# Patient Record
Sex: Female | Born: 1975 | Race: Black or African American | Hispanic: No | Marital: Single | Smoking: Never smoker
Health system: Southern US, Community
[De-identification: ages and names within clinical notes are randomized; demographics above are authoritative.]

## PROBLEM LIST (undated history)

## (undated) DIAGNOSIS — M069 Rheumatoid arthritis, unspecified: Secondary | ICD-10-CM

## (undated) DIAGNOSIS — K589 Irritable bowel syndrome without diarrhea: Secondary | ICD-10-CM

## (undated) DIAGNOSIS — J45909 Unspecified asthma, uncomplicated: Secondary | ICD-10-CM

## (undated) HISTORY — PX: HERNIA REPAIR: SHX51

## (undated) HISTORY — PX: ABDOMINAL HYSTERECTOMY: SHX81

## (undated) HISTORY — PX: APPENDECTOMY: SHX54

## (undated) HISTORY — PX: CHOLECYSTECTOMY: SHX55

## (undated) HISTORY — PX: TUBAL LIGATION: SHX77

## (undated) HISTORY — PX: CYSTECTOMY: SUR359

## (undated) HISTORY — PX: BREAST REDUCTION SURGERY: SHX8

---

## 2002-10-02 ENCOUNTER — Emergency Department (HOSPITAL_COMMUNITY): Admission: EM | Admit: 2002-10-02 | Discharge: 2002-10-03 | Payer: Self-pay | Admitting: Emergency Medicine

## 2002-10-10 ENCOUNTER — Emergency Department (HOSPITAL_COMMUNITY): Admission: EM | Admit: 2002-10-10 | Discharge: 2002-10-10 | Payer: Self-pay | Admitting: Emergency Medicine

## 2002-10-11 ENCOUNTER — Encounter: Payer: Self-pay | Admitting: Emergency Medicine

## 2002-10-28 ENCOUNTER — Encounter: Payer: Self-pay | Admitting: Emergency Medicine

## 2002-10-28 ENCOUNTER — Emergency Department (HOSPITAL_COMMUNITY): Admission: EM | Admit: 2002-10-28 | Discharge: 2002-10-29 | Payer: Self-pay | Admitting: Emergency Medicine

## 2002-11-26 ENCOUNTER — Encounter: Payer: Self-pay | Admitting: Emergency Medicine

## 2002-11-26 ENCOUNTER — Emergency Department (HOSPITAL_COMMUNITY): Admission: EM | Admit: 2002-11-26 | Discharge: 2002-11-26 | Payer: Self-pay | Admitting: Emergency Medicine

## 2002-11-30 ENCOUNTER — Emergency Department (HOSPITAL_COMMUNITY): Admission: EM | Admit: 2002-11-30 | Discharge: 2002-12-01 | Payer: Self-pay | Admitting: Emergency Medicine

## 2002-12-02 ENCOUNTER — Emergency Department (HOSPITAL_COMMUNITY): Admission: EM | Admit: 2002-12-02 | Discharge: 2002-12-02 | Payer: Self-pay | Admitting: Emergency Medicine

## 2003-01-07 ENCOUNTER — Emergency Department (HOSPITAL_COMMUNITY): Admission: EM | Admit: 2003-01-07 | Discharge: 2003-01-07 | Payer: Self-pay | Admitting: Emergency Medicine

## 2003-03-25 ENCOUNTER — Emergency Department (HOSPITAL_COMMUNITY): Admission: EM | Admit: 2003-03-25 | Discharge: 2003-03-26 | Payer: Self-pay | Admitting: Emergency Medicine

## 2003-03-26 ENCOUNTER — Encounter: Payer: Self-pay | Admitting: Emergency Medicine

## 2003-03-31 ENCOUNTER — Emergency Department (HOSPITAL_COMMUNITY): Admission: EM | Admit: 2003-03-31 | Discharge: 2003-03-31 | Payer: Self-pay | Admitting: Emergency Medicine

## 2003-04-02 ENCOUNTER — Inpatient Hospital Stay (HOSPITAL_COMMUNITY): Admission: AD | Admit: 2003-04-02 | Discharge: 2003-04-02 | Payer: Self-pay | Admitting: Family Medicine

## 2003-04-25 ENCOUNTER — Emergency Department (HOSPITAL_COMMUNITY): Admission: EM | Admit: 2003-04-25 | Discharge: 2003-04-25 | Payer: Self-pay | Admitting: Emergency Medicine

## 2003-06-15 ENCOUNTER — Encounter: Payer: Self-pay | Admitting: Emergency Medicine

## 2003-06-16 ENCOUNTER — Inpatient Hospital Stay (HOSPITAL_COMMUNITY): Admission: EM | Admit: 2003-06-16 | Discharge: 2003-06-17 | Payer: Self-pay | Admitting: Emergency Medicine

## 2003-07-28 ENCOUNTER — Emergency Department (HOSPITAL_COMMUNITY): Admission: EM | Admit: 2003-07-28 | Discharge: 2003-07-28 | Payer: Self-pay | Admitting: *Deleted

## 2003-09-05 ENCOUNTER — Encounter: Payer: Self-pay | Admitting: Emergency Medicine

## 2003-09-05 ENCOUNTER — Emergency Department (HOSPITAL_COMMUNITY): Admission: EM | Admit: 2003-09-05 | Discharge: 2003-09-05 | Payer: Self-pay | Admitting: Emergency Medicine

## 2003-09-15 ENCOUNTER — Emergency Department (HOSPITAL_COMMUNITY): Admission: EM | Admit: 2003-09-15 | Discharge: 2003-09-15 | Payer: Self-pay | Admitting: Emergency Medicine

## 2003-09-15 ENCOUNTER — Encounter: Payer: Self-pay | Admitting: Emergency Medicine

## 2003-09-18 ENCOUNTER — Encounter: Payer: Self-pay | Admitting: Emergency Medicine

## 2003-09-18 ENCOUNTER — Emergency Department (HOSPITAL_COMMUNITY): Admission: EM | Admit: 2003-09-18 | Discharge: 2003-09-18 | Payer: Self-pay | Admitting: Emergency Medicine

## 2003-09-25 ENCOUNTER — Emergency Department (HOSPITAL_COMMUNITY): Admission: EM | Admit: 2003-09-25 | Discharge: 2003-09-26 | Payer: Self-pay | Admitting: Emergency Medicine

## 2003-09-25 ENCOUNTER — Encounter: Payer: Self-pay | Admitting: Emergency Medicine

## 2003-09-30 ENCOUNTER — Emergency Department (HOSPITAL_COMMUNITY): Admission: EM | Admit: 2003-09-30 | Discharge: 2003-09-30 | Payer: Self-pay | Admitting: Emergency Medicine

## 2003-09-30 ENCOUNTER — Encounter: Payer: Self-pay | Admitting: Emergency Medicine

## 2003-10-05 ENCOUNTER — Emergency Department (HOSPITAL_COMMUNITY): Admission: EM | Admit: 2003-10-05 | Discharge: 2003-10-06 | Payer: Self-pay | Admitting: Emergency Medicine

## 2003-10-07 ENCOUNTER — Emergency Department (HOSPITAL_COMMUNITY): Admission: EM | Admit: 2003-10-07 | Discharge: 2003-10-07 | Payer: Self-pay | Admitting: Emergency Medicine

## 2003-10-07 ENCOUNTER — Encounter: Payer: Self-pay | Admitting: Emergency Medicine

## 2003-10-17 ENCOUNTER — Emergency Department (HOSPITAL_COMMUNITY): Admission: EM | Admit: 2003-10-17 | Discharge: 2003-10-17 | Payer: Self-pay | Admitting: Emergency Medicine

## 2003-10-22 ENCOUNTER — Encounter (INDEPENDENT_AMBULATORY_CARE_PROVIDER_SITE_OTHER): Payer: Self-pay | Admitting: Specialist

## 2003-10-22 ENCOUNTER — Ambulatory Visit (HOSPITAL_COMMUNITY): Admission: RE | Admit: 2003-10-22 | Discharge: 2003-10-23 | Payer: Self-pay | Admitting: Surgery

## 2003-10-30 ENCOUNTER — Inpatient Hospital Stay (HOSPITAL_COMMUNITY): Admission: EM | Admit: 2003-10-30 | Discharge: 2003-11-04 | Payer: Self-pay | Admitting: Emergency Medicine

## 2003-12-02 ENCOUNTER — Emergency Department (HOSPITAL_COMMUNITY): Admission: EM | Admit: 2003-12-02 | Discharge: 2003-12-02 | Payer: Self-pay | Admitting: Emergency Medicine

## 2003-12-21 ENCOUNTER — Emergency Department (HOSPITAL_COMMUNITY): Admission: EM | Admit: 2003-12-21 | Discharge: 2003-12-21 | Payer: Self-pay | Admitting: Emergency Medicine

## 2003-12-22 ENCOUNTER — Emergency Department (HOSPITAL_COMMUNITY): Admission: EM | Admit: 2003-12-22 | Discharge: 2003-12-22 | Payer: Self-pay | Admitting: Emergency Medicine

## 2003-12-24 ENCOUNTER — Emergency Department (HOSPITAL_COMMUNITY): Admission: EM | Admit: 2003-12-24 | Discharge: 2003-12-24 | Payer: Self-pay | Admitting: Emergency Medicine

## 2003-12-31 ENCOUNTER — Emergency Department (HOSPITAL_COMMUNITY): Admission: EM | Admit: 2003-12-31 | Discharge: 2003-12-31 | Payer: Self-pay | Admitting: Emergency Medicine

## 2004-01-06 ENCOUNTER — Emergency Department (HOSPITAL_COMMUNITY): Admission: EM | Admit: 2004-01-06 | Discharge: 2004-01-06 | Payer: Self-pay | Admitting: Emergency Medicine

## 2004-01-10 ENCOUNTER — Encounter: Admission: RE | Admit: 2004-01-10 | Discharge: 2004-01-10 | Payer: Self-pay | Admitting: Surgery

## 2004-01-12 ENCOUNTER — Emergency Department (HOSPITAL_COMMUNITY): Admission: EM | Admit: 2004-01-12 | Discharge: 2004-01-12 | Payer: Self-pay | Admitting: Unknown Physician Specialty

## 2004-02-06 ENCOUNTER — Emergency Department (HOSPITAL_COMMUNITY): Admission: EM | Admit: 2004-02-06 | Discharge: 2004-02-06 | Payer: Self-pay | Admitting: Emergency Medicine

## 2004-02-17 ENCOUNTER — Emergency Department (HOSPITAL_COMMUNITY): Admission: EM | Admit: 2004-02-17 | Discharge: 2004-02-18 | Payer: Self-pay | Admitting: Emergency Medicine

## 2004-02-25 ENCOUNTER — Emergency Department (HOSPITAL_COMMUNITY): Admission: EM | Admit: 2004-02-25 | Discharge: 2004-02-25 | Payer: Self-pay | Admitting: Emergency Medicine

## 2004-02-25 ENCOUNTER — Encounter: Payer: Self-pay | Admitting: Family Medicine

## 2004-02-26 ENCOUNTER — Other Ambulatory Visit: Admission: RE | Admit: 2004-02-26 | Discharge: 2004-02-26 | Payer: Self-pay | Admitting: Obstetrics and Gynecology

## 2004-03-06 ENCOUNTER — Emergency Department (HOSPITAL_COMMUNITY): Admission: EM | Admit: 2004-03-06 | Discharge: 2004-03-06 | Payer: Self-pay | Admitting: *Deleted

## 2004-03-17 ENCOUNTER — Emergency Department (HOSPITAL_COMMUNITY): Admission: EM | Admit: 2004-03-17 | Discharge: 2004-03-17 | Payer: Self-pay | Admitting: Emergency Medicine

## 2004-05-08 ENCOUNTER — Ambulatory Visit (HOSPITAL_COMMUNITY): Admission: RE | Admit: 2004-05-08 | Discharge: 2004-05-08 | Payer: Self-pay | Admitting: Internal Medicine

## 2004-05-16 ENCOUNTER — Emergency Department (HOSPITAL_COMMUNITY): Admission: EM | Admit: 2004-05-16 | Discharge: 2004-05-16 | Payer: Self-pay | Admitting: Emergency Medicine

## 2004-05-18 ENCOUNTER — Emergency Department (HOSPITAL_COMMUNITY): Admission: EM | Admit: 2004-05-18 | Discharge: 2004-05-18 | Payer: Self-pay | Admitting: Emergency Medicine

## 2004-05-24 ENCOUNTER — Emergency Department (HOSPITAL_COMMUNITY): Admission: EM | Admit: 2004-05-24 | Discharge: 2004-05-24 | Payer: Self-pay | Admitting: Emergency Medicine

## 2004-06-01 ENCOUNTER — Emergency Department (HOSPITAL_COMMUNITY): Admission: EM | Admit: 2004-06-01 | Discharge: 2004-06-01 | Payer: Self-pay | Admitting: Emergency Medicine

## 2004-07-03 ENCOUNTER — Emergency Department (HOSPITAL_COMMUNITY): Admission: EM | Admit: 2004-07-03 | Discharge: 2004-07-04 | Payer: Self-pay | Admitting: Emergency Medicine

## 2004-07-05 ENCOUNTER — Emergency Department (HOSPITAL_COMMUNITY): Admission: EM | Admit: 2004-07-05 | Discharge: 2004-07-05 | Payer: Self-pay | Admitting: Emergency Medicine

## 2004-07-20 ENCOUNTER — Emergency Department (HOSPITAL_COMMUNITY): Admission: EM | Admit: 2004-07-20 | Discharge: 2004-07-20 | Payer: Self-pay | Admitting: Emergency Medicine

## 2004-10-17 ENCOUNTER — Emergency Department (HOSPITAL_COMMUNITY): Admission: EM | Admit: 2004-10-17 | Discharge: 2004-10-17 | Payer: Self-pay | Admitting: Emergency Medicine

## 2004-11-16 IMAGING — CT CT ABDOMEN W/ CM
1 of 3 series · 15 of 32 positions shown, 20 images · IV contrast (omnipaque)
Comparison: none

CLINICAL DATA: abdominal pain
 CT ABDOMEN AND PELVIS WITH CONTRAST
TECHNIQUE: Multidetector helical imaging carried out through the abdomen and pelvis utilizing oral and IV contrast (660cc Omnipaque 300).  Comparison:  10/29/03.
 CT ABDOMEN WITH CONTRAST
 Lung bases clear.  Liver, spleen, pancreas and adrenals normal.  Prior cholecystectomy.  No bile duct dilatation.  Early and delayed images of the kidneys normal.  No adenopathy or ascites.
 IMPRESSION
 Status post cholecystectomy- no acute or specific findings.  
 CT PELVIS
 Status post hysterectomy and appendectomy.  No focal masses, adenopathy or fluid collections.  No changes to suggest diverticulitis.
 No pathological findings in the pelvis.

[Series 3: — · axial · 0.77mm/px · z∈[+772,+1192]mm · 15 of 94 slices shown, 20 images]
[im 5/94  soft-tissue]
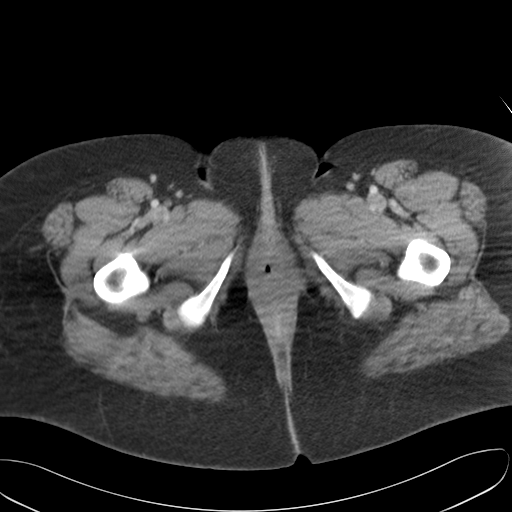
[im 5/94  bone]
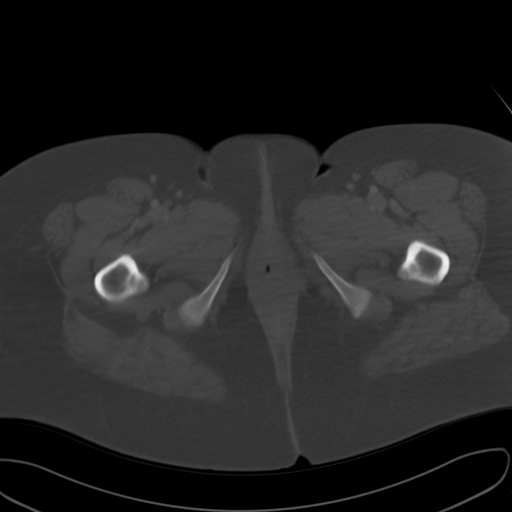
[im 14/94  soft-tissue]
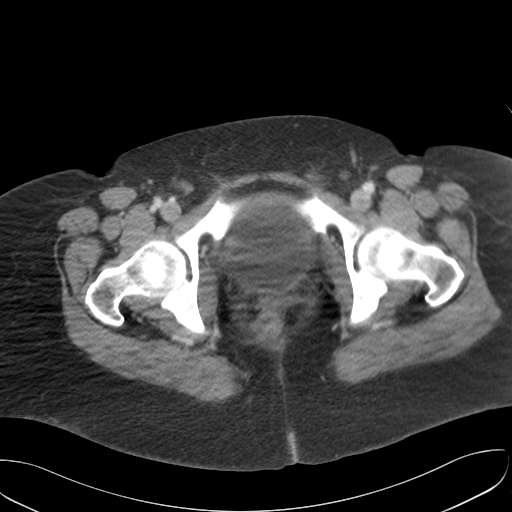
[im 19/94  soft-tissue]
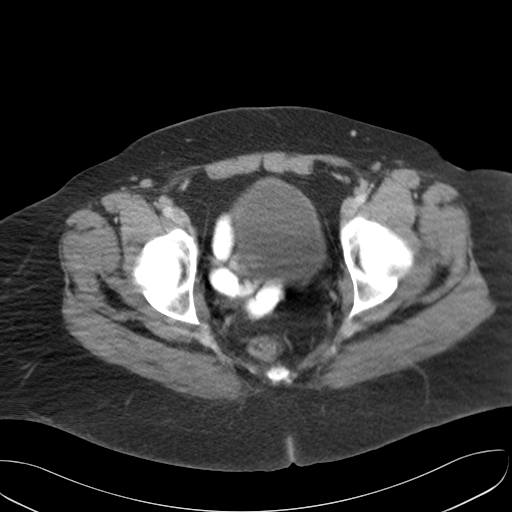
[im 24/94  soft-tissue]
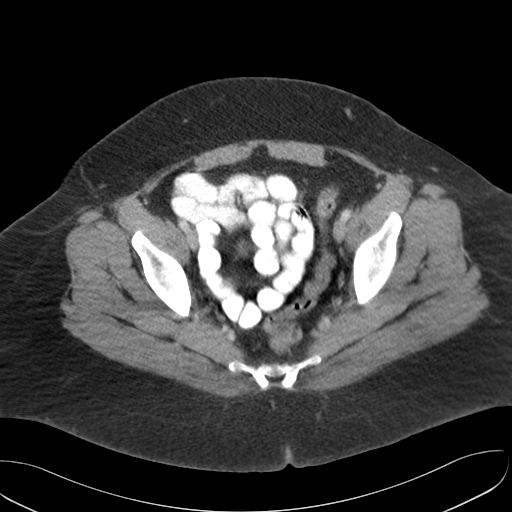
[im 33/94  soft-tissue]
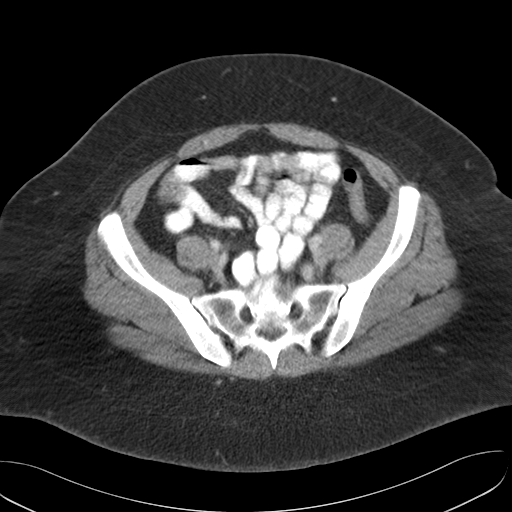
[im 38/94  soft-tissue]
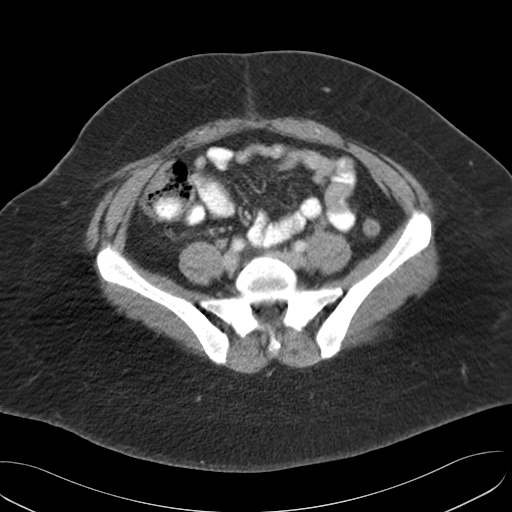
[im 42/94  soft-tissue]
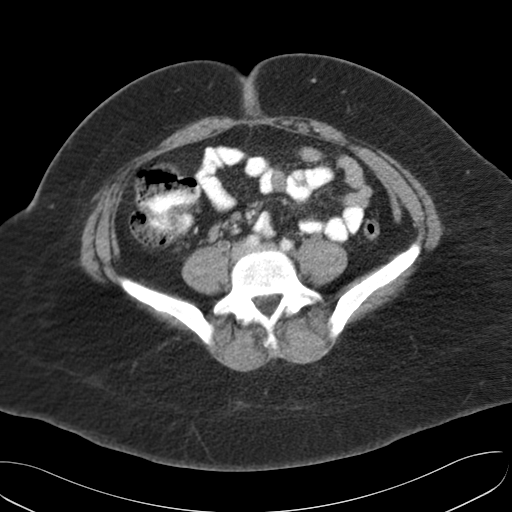
[im 52/94  soft-tissue]
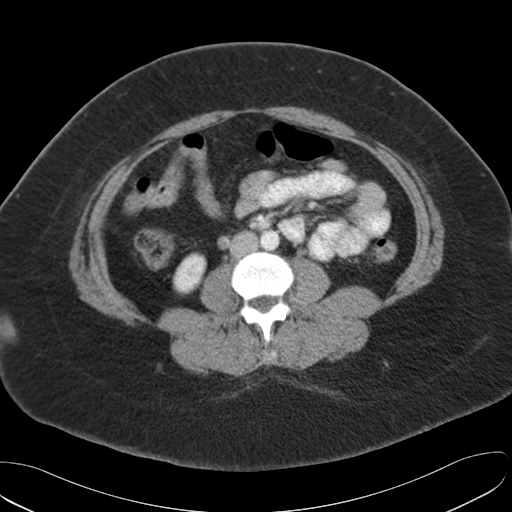
[im 56/94  soft-tissue]
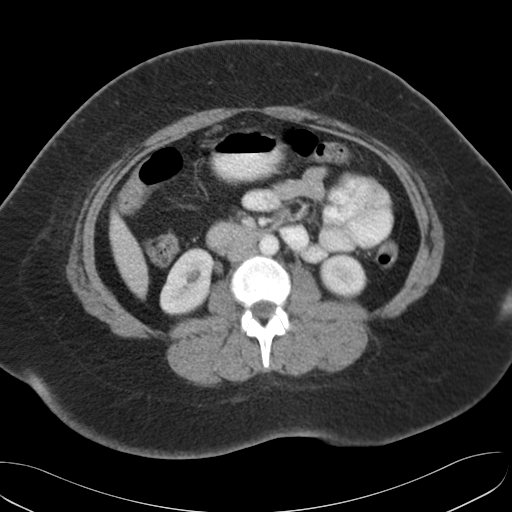
[im 56/94  bone]
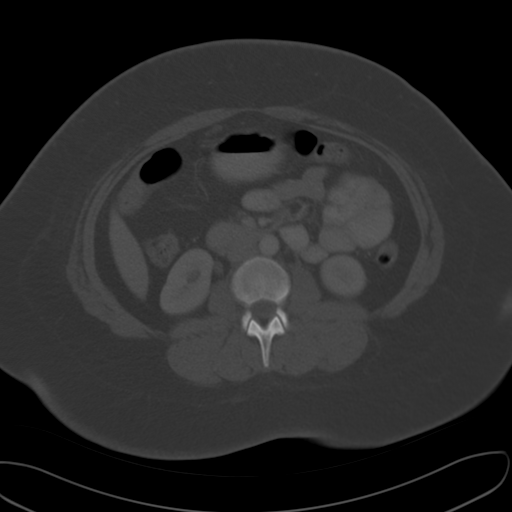
[im 61/94  soft-tissue]
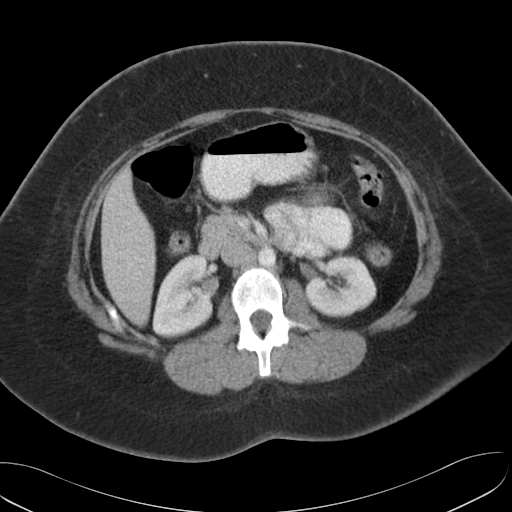
[im 70/94  soft-tissue]
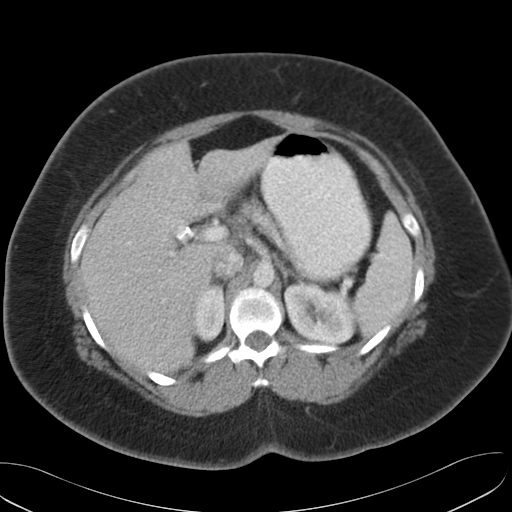
[im 75/94  soft-tissue]
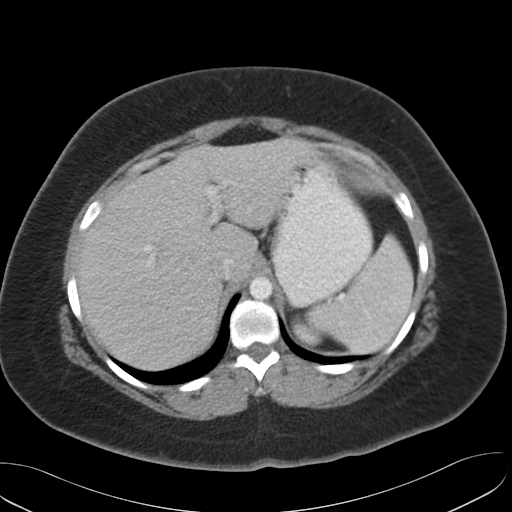
[im 75/94  lung]
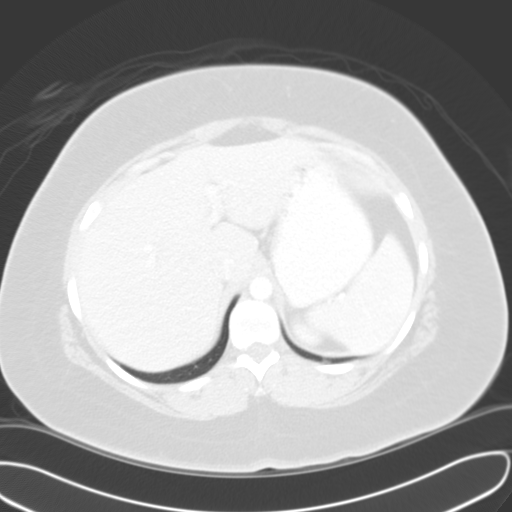
[im 80/94  soft-tissue]
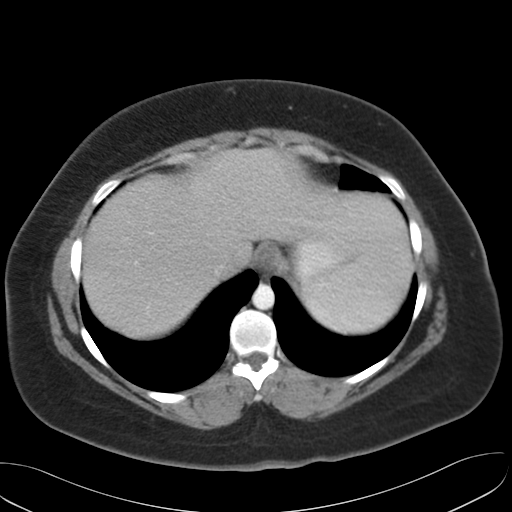
[im 80/94  lung]
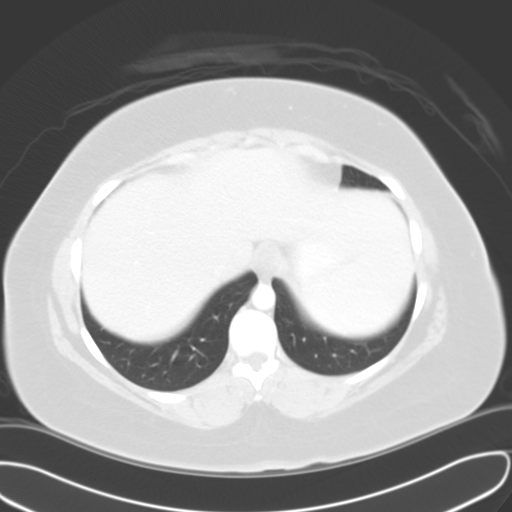
[im 84/94  lung]
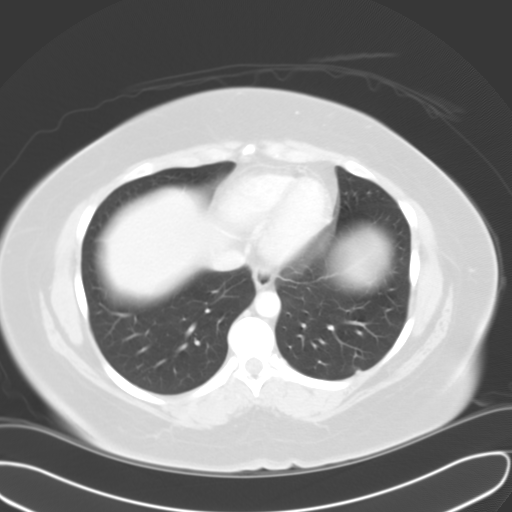
[im 89/94  soft-tissue]
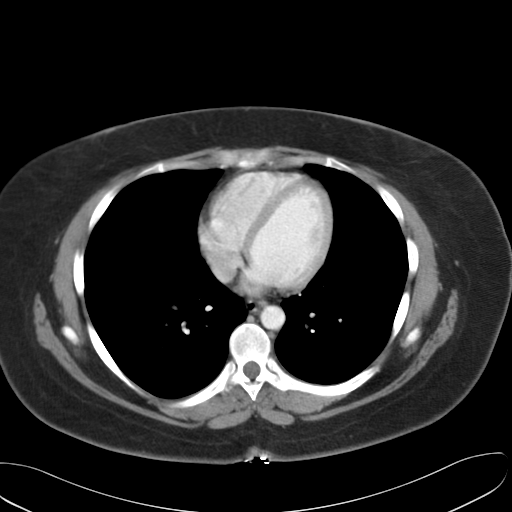
[im 89/94  lung]
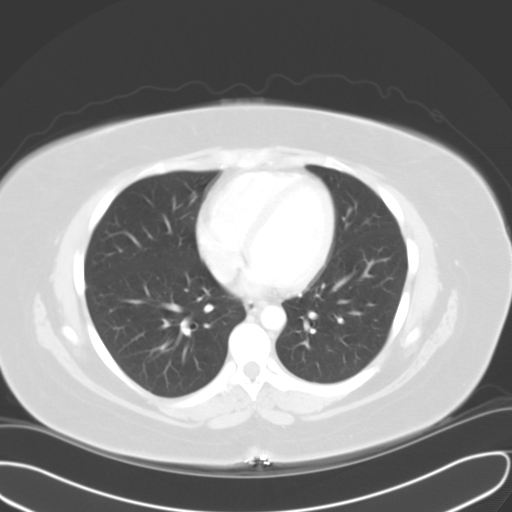

[15 of 32 positions shown; findings below may reference images not displayed]

## 2005-03-31 ENCOUNTER — Emergency Department (HOSPITAL_COMMUNITY): Admission: EM | Admit: 2005-03-31 | Discharge: 2005-03-31 | Payer: Self-pay | Admitting: Emergency Medicine

## 2005-08-09 ENCOUNTER — Emergency Department (HOSPITAL_COMMUNITY): Admission: EM | Admit: 2005-08-09 | Discharge: 2005-08-09 | Payer: Self-pay | Admitting: Emergency Medicine

## 2005-08-20 ENCOUNTER — Emergency Department (HOSPITAL_COMMUNITY): Admission: EM | Admit: 2005-08-20 | Discharge: 2005-08-20 | Payer: Self-pay | Admitting: Family Medicine

## 2005-09-15 ENCOUNTER — Emergency Department (HOSPITAL_COMMUNITY): Admission: EM | Admit: 2005-09-15 | Discharge: 2005-09-15 | Payer: Self-pay | Admitting: Family Medicine

## 2005-09-26 ENCOUNTER — Emergency Department (HOSPITAL_COMMUNITY): Admission: EM | Admit: 2005-09-26 | Discharge: 2005-09-26 | Payer: Self-pay | Admitting: Emergency Medicine

## 2005-10-31 ENCOUNTER — Emergency Department (HOSPITAL_COMMUNITY): Admission: EM | Admit: 2005-10-31 | Discharge: 2005-11-01 | Payer: Self-pay | Admitting: Emergency Medicine

## 2005-11-26 ENCOUNTER — Emergency Department (HOSPITAL_COMMUNITY): Admission: EM | Admit: 2005-11-26 | Discharge: 2005-11-26 | Payer: Self-pay | Admitting: Family Medicine

## 2005-11-26 ENCOUNTER — Emergency Department (HOSPITAL_COMMUNITY): Admission: EM | Admit: 2005-11-26 | Discharge: 2005-11-27 | Payer: Self-pay | Admitting: Emergency Medicine

## 2005-11-29 ENCOUNTER — Emergency Department (HOSPITAL_COMMUNITY): Admission: EM | Admit: 2005-11-29 | Discharge: 2005-11-29 | Payer: Self-pay | Admitting: Emergency Medicine

## 2005-11-29 ENCOUNTER — Emergency Department (HOSPITAL_COMMUNITY): Admission: EM | Admit: 2005-11-29 | Discharge: 2005-11-30 | Payer: Self-pay | Admitting: Emergency Medicine

## 2006-02-17 ENCOUNTER — Emergency Department (HOSPITAL_COMMUNITY): Admission: EM | Admit: 2006-02-17 | Discharge: 2006-02-17 | Payer: Self-pay | Admitting: Emergency Medicine

## 2006-09-01 ENCOUNTER — Emergency Department (HOSPITAL_COMMUNITY): Admission: EM | Admit: 2006-09-01 | Discharge: 2006-09-02 | Payer: Self-pay | Admitting: Emergency Medicine

## 2007-02-05 ENCOUNTER — Emergency Department (HOSPITAL_COMMUNITY): Admission: EM | Admit: 2007-02-05 | Discharge: 2007-02-05 | Payer: Self-pay | Admitting: *Deleted

## 2007-05-14 ENCOUNTER — Emergency Department (HOSPITAL_COMMUNITY): Admission: EM | Admit: 2007-05-14 | Discharge: 2007-05-14 | Payer: Self-pay | Admitting: Emergency Medicine

## 2008-01-18 ENCOUNTER — Emergency Department (HOSPITAL_COMMUNITY): Admission: EM | Admit: 2008-01-18 | Discharge: 2008-01-19 | Payer: Self-pay | Admitting: Emergency Medicine

## 2008-05-21 ENCOUNTER — Emergency Department (HOSPITAL_COMMUNITY): Admission: EM | Admit: 2008-05-21 | Discharge: 2008-05-22 | Payer: Self-pay | Admitting: Emergency Medicine

## 2008-08-12 ENCOUNTER — Emergency Department (HOSPITAL_COMMUNITY): Admission: EM | Admit: 2008-08-12 | Discharge: 2008-08-12 | Payer: Self-pay | Admitting: Emergency Medicine

## 2008-08-25 ENCOUNTER — Emergency Department (HOSPITAL_COMMUNITY): Admission: EM | Admit: 2008-08-25 | Discharge: 2008-08-25 | Payer: Self-pay | Admitting: Emergency Medicine

## 2008-09-26 ENCOUNTER — Emergency Department (HOSPITAL_COMMUNITY): Admission: EM | Admit: 2008-09-26 | Discharge: 2008-09-26 | Payer: Self-pay | Admitting: Family Medicine

## 2009-04-20 ENCOUNTER — Emergency Department (HOSPITAL_COMMUNITY): Admission: EM | Admit: 2009-04-20 | Discharge: 2009-04-21 | Payer: Self-pay | Admitting: Emergency Medicine

## 2009-06-05 ENCOUNTER — Emergency Department (HOSPITAL_COMMUNITY): Admission: EM | Admit: 2009-06-05 | Discharge: 2009-06-05 | Payer: Self-pay | Admitting: Emergency Medicine

## 2009-09-20 ENCOUNTER — Emergency Department (HOSPITAL_COMMUNITY): Admission: EM | Admit: 2009-09-20 | Discharge: 2009-09-20 | Payer: Self-pay | Admitting: Emergency Medicine

## 2009-10-09 ENCOUNTER — Emergency Department (HOSPITAL_COMMUNITY): Admission: EM | Admit: 2009-10-09 | Discharge: 2009-10-09 | Payer: Self-pay | Admitting: Family Medicine

## 2010-07-18 ENCOUNTER — Emergency Department (HOSPITAL_COMMUNITY): Admission: EM | Admit: 2010-07-18 | Discharge: 2010-07-19 | Payer: Self-pay | Admitting: Emergency Medicine

## 2011-01-03 ENCOUNTER — Encounter: Payer: Self-pay | Admitting: Gastroenterology

## 2011-02-27 LAB — DIFFERENTIAL
Basophils Relative: 0 % (ref 0–1)
Lymphocytes Relative: 30 % (ref 12–46)
Lymphs Abs: 1.7 10*3/uL (ref 0.7–4.0)
Neutrophils Relative %: 61 % (ref 43–77)

## 2011-02-27 LAB — URINALYSIS, ROUTINE W REFLEX MICROSCOPIC
Bilirubin Urine: NEGATIVE
Urobilinogen, UA: 0.2 mg/dL (ref 0.0–1.0)
pH: 6 (ref 5.0–8.0)

## 2011-02-27 LAB — CBC
Hemoglobin: 12.6 g/dL (ref 12.0–15.0)
Platelets: 276 10*3/uL (ref 150–400)
RBC: 4.16 MIL/uL (ref 3.87–5.11)
RDW: 13.8 % (ref 11.5–15.5)

## 2011-02-27 LAB — BASIC METABOLIC PANEL
CO2: 28 mEq/L (ref 19–32)
Creatinine, Ser: 0.72 mg/dL (ref 0.4–1.2)
GFR calc Af Amer: >60 mL/min (ref >60)
GFR calc non Af Amer: >60 mL/min (ref >60)
Glucose, Bld: 97 mg/dL (ref 70–99)
Potassium: 4.2 mEq/L (ref 3.5–5.1)
Sodium: 137 mEq/L (ref 135–145)

## 2011-03-24 LAB — URINALYSIS, ROUTINE W REFLEX MICROSCOPIC
Bilirubin Urine: NEGATIVE
Glucose, UA: NEGATIVE mg/dL
Ketones, ur: NEGATIVE mg/dL
Nitrite: NEGATIVE
Protein, ur: NEGATIVE mg/dL

## 2011-03-24 LAB — CBC
Hemoglobin: 12.4 g/dL (ref 12.0–15.0)
MCV: 89.1 fL (ref 78.0–100.0)
Platelets: 272 10*3/uL (ref 150–400)
RBC: 4.03 MIL/uL (ref 3.87–5.11)
RDW: 14.2 % (ref 11.5–15.5)
WBC: 6.2 10*3/uL (ref 4.0–10.5)

## 2011-03-24 LAB — LIPASE, BLOOD: Lipase: 16 U/L (ref 11–59)

## 2011-03-24 LAB — POCT I-STAT, CHEM 8
BUN: 7 mg/dL (ref 6–23)
Calcium, Ion: 1.14 mmol/L (ref 1.12–1.32)
Chloride: 104 mEq/L (ref 96–112)
Creatinine, Ser: 0.8 mg/dL (ref 0.4–1.2)
Glucose, Bld: 93 mg/dL (ref 70–99)
HCT: 38 % (ref 36.0–46.0)
Hemoglobin: 12.9 g/dL (ref 12.0–15.0)
Potassium: 4 meq/L (ref 3.5–5.1)
Sodium: 140 meq/L (ref 135–145)
TCO2: 26 mmol/L (ref 0–100)

## 2011-03-24 LAB — DIFFERENTIAL
Basophils Relative: 0 % (ref 0–1)
Eosinophils Absolute: 0.2 10*3/uL (ref 0.0–0.7)
Neutrophils Relative %: 59 % (ref 43–77)

## 2011-03-24 LAB — COMPREHENSIVE METABOLIC PANEL
ALT: 17 U/L (ref 0–35)
Alkaline Phosphatase: 85 U/L (ref 39–117)
CO2: 28 mEq/L (ref 19–32)
Chloride: 102 mEq/L (ref 96–112)
GFR calc non Af Amer: >60 mL/min (ref >60)
Glucose, Bld: 93 mg/dL (ref 70–99)
Potassium: 4 mEq/L (ref 3.5–5.1)
Sodium: 137 mEq/L (ref 135–145)

## 2011-05-01 NOTE — Discharge Summary (Signed)
[name], [name] NO.:  [id number]   MEDICAL RECORD NO.:  [id number]                   PATIENT TYPE:  INP   LOCATION:  5742                                 FACILITY:  [facility]   PHYSICIAN:  Alvester Morin, M.D.               DATE OF BIRTH:  [birth year]   DATE OF ADMISSION:  06/17/2003  DATE OF DISCHARGE:  06/17/2003                                 DISCHARGE SUMMARY   HISTORY:  Patient is a 35 year old, African-American female with a past  medical history significant for chronic abdominal pain, multiple abdominal  surgeries, NSAIDs use, steroid use.  Admitted to the ER with a three-day  history of sharp abdominal pain, worse on the right side, with some  radiation to the back and the left side.  Patient has had this pain every  three to four months for approximately three years.  Pain seems to be worse  with food and at night, sometimes worse with bowel movements and  inspiration.  Excedrin and balling up in the fetal position seem to help  relieve the pain.  Pain gets severe enough sometimes that she cannot bend  over and has trouble walking.  The pain usually comes on gradually, every  three to four days, lasts another three or four days and then improves  gradually over three or four days.  Patient denies any hematochezia, melena  and no change in bowel movements.  Patient has had some nausea but no  vomiting and has also had loss of appetite.   PAST MEDICAL HISTORY:  1. Patient has had uterine fibroids.  2. Ovarian cysts.  3. Asthma.  4. Chronic headaches.  5. Questionable gallbladder disease, per patient, although evidence of this     was not found in the record.   PAST SURGICAL HISTORY:  1. Includes a total abdominal hysterectomy with bilateral salpingo-     oophorectomy, and this was secondary to menorrhagia and inverted uterus     after childbirth.  2. Patient has also had ovarian cyst removal.  3. Hernia repair.  4. Bilateral tubal  ligation in 1974.  5. Appendectomy, supposedly due to rupture.  6. Patient has also had an abortion.   FAMILY HISTORY:  Mother has had multiple abdominal problems, including a  hysterectomy, hiatal hernia, uterine fibroids and cysts on her ovaries.  Father had a bleeding peptic ulcer.  Siblings and children are both healthy.  Has also had aunts and uncles with multiple different kinds of cancers.   SOCIAL HISTORY:  Patient is married.  Lives with husband and two children,  who are 12 years old and 70 years old each.  Patient denies ever using  tobacco, alcohol, cocaine or IV drugs.   HOME MEDICATIONS:  Patient uses Tylenol and Excedrin, about 2 tablets every  4 hours nearly every day for headache and abdominal pain.  Patient also has  a history of using  steroids for asthma, about three years ago, which was  when the abdominal pain originally began.   PHYSICAL EXAMINATION:  Patient's physical exam was completely benign.  ABDOMEN:  Patient had normoactive bowel sounds, soft abdomen, which was  tender diffusely but worse in the right lower quadrant and right upper  quadrant and epigastric area.   LABORATORY DATA:  A chest x-ray and abdominal ultrasound were both within  normal limits.  All labs were within normal limits, including amylase,  lipase, liver function tests, CMET and CBC.   Patient was admitted to the hospital to work up the abdominal pain, with the  thought of having her scoped.  However, patient was completely stable, and  it was believed that the pain could be controlled as an outpatient, and then  the patient could be followed up as an outpatient.   Patient was then discharged on June 17, 2003.  Patient was sent home on a  Vivelle patch 0.0375 mg per day.  This was requested by the patient, as she  felt like, previously, when she had been on hormone replacement therapy, it  helped with her depression.  She is also sent home on omeprazole 20 mg once  a day, and this is  for abdominal pain, amitriptyline 50 mg once a day for  depression, pain and insomnia, Vicodin 500/5 once a day every 6 hours as  needed for pain, just for short-term pain control.  The ____________ with  patient is to see if this medication regimen helps.   DIFFERENTIAL DIAGNOSIS:  Peptic ulcer disease versus gastritis versus  irritable bowel syndrome.  If these medicines do not help control pain, we  need to have the patient scheduled for colonoscopy and endoscopy to evaluate  for possible causes.  Patient was also instructed to stop taking the  Excedrin for her headache.   FOLLOW UP:  She has a followup appointment in [facility]  scheduled for August 18 at 9:10 in the morning, with me, Dr. Lyda Perone.  Patient  is also to complete a urine porphyrin, which is pending and a drug screen  for her also is pending.     [place] Char[place], MD                        Alvester Morin, M.D.    KK/MEDQ  D:  06/20/2003  T:  06/21/2003  Job:  [id number]

## 2011-05-01 NOTE — Discharge Summary (Signed)
[name], [name]                         ACCOUNT NO.:  [id number]   MEDICAL RECORD NO.:  [id number]                   PATIENT TYPE:   LOCATION:                                       FACILITY:   PHYSICIAN:  Jackie Plum, M.D.             DATE OF BIRTH:   DATE OF ADMISSION:  10/27/2003  DATE OF DISCHARGE:  11/04/2003                                 DISCHARGE SUMMARY   DISCHARGE DIAGNOSES:  1. Abdominal pain, improved, etiology unclear.     a. Acute abdominal series negative without any acute findings.     b. 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  2. History of chronic constipation.  3. History of chronic headaches.  4. History of asthma and allergies.  5. History of multiple abdominal surgeries including hysterectomy,     cholecystectomy, and appendectomy.   DISCHARGE MEDICATIONS:  The patient is to resume her other chronic  medications as previously.  Percocet was prescribed for the patient as  previously.  Albuterol inhaler and Advair inhaler.   Activity is as tolerated.  Her diet is to be bland diet.  She is to follow  up with Dr. Loreta Ave in the office, appointment one week from discharge.   CONSULTATIONS:  1. Anselmo Rod, M.D.  2. General surgery.   PROCEDURE:  Gastric emptying test as noted above.   CONDITION ON DISCHARGE:  Improved and satisfactory.   REASON FOR ADMISSION:  Acute on chronic abdominal pain.  Please see  admission H&P.  The patient is a 35 year old African-American lady with a  history of multiple abdominal surgeries and chronic abdominal pain, who  presented to the ED with acute exacerbation of her pain.  The patient on  admission had been seen by surgical consult in the ED without any obvious  evidence of obstruction.  CT of the abdomen and pelvis was done, and it  showed a large amount of stool.  She had a calcium of 10.7; however, her  ionized calcium on follow-up was normal.  The patient was therefore admitted  to  the medical service for pain control and further evaluation.   HOSPITAL COURSE:  She was admitted to the hospitalist service.  Supportive  measures included IV fluids with IV analgesics and antiemetics were  initiated.  Surgery followed the patient and apparently decided the  patient's condition was not surgical.  She was given a bowel regimen with  laxatives.  The patient was seen in consultation by gastroenterologist, Dr.  Loreta Ave, who recommended gastric emptying test as noted above.  With these  measures, slowly  the patient's symptoms improved and at time of discharge, it is believed  that her symptoms are likely related to gastroparesis.  Constipation  secondary to narcotic medication may also be contributory.  The patient is  discharged home in stable, satisfactory condition, to follow up with Dr.  Loreta Ave as an outpatient.  Jackie Plum, M.D.    GO/MEDQ  D:  06/25/2004  T:  06/25/2004  Job:  [id number]   cc:   Anselmo Rod, M.D.  [address]  [address]  [place]  [place] [address]  Fax: [phone]

## 2011-05-01 NOTE — Consult Note (Signed)
[name], [name] NO.:  [id number]   MEDICAL RECORD NO.:  [id number]                   PATIENT TYPE:  INP   LOCATION:  0343                                 FACILITY:  [facility]   PHYSICIAN:  Anselmo Rod, M.D.               DATE OF BIRTH:  [birth year]   DATE OF CONSULTATION:  11/02/2003  DATE OF DISCHARGE:                                   CONSULTATION   REASON FOR CONSULTATION:  Abdominal pain with ongoing nausea and back pain.   ASSESSMENT:  1. Periumbilical pain with nausea, etiology unclear, rule out gastroparesis.  2. Status post laparoscopic cholecystectomy on October 22, 2003 for chronic     cholecystitis and cholesterolosis.  3. Chronic constipation, improved after laparoscopic cholecystectomy.  4. Chronic headaches, on multiple narcotics.  5. Mild hypercalcemia on admission with normal ionized calcium levels.  6. History of asthma/question allergies.  7. Status post right inguinal hernia repair at age 73.  8. Two normal vaginal deliveries followed by a bilateral tubal ligation.  9. Status post appendectomy and abdominal hysterectomy with bilateral     salpingo-oophorectomy for fibroids.  10.      Breast reduction at age 81.   RECOMMENDATIONS:  1. Check UA to rule out nephrolithiasis.  2. Schedule gastric emptying study to rule out gastroparesis.  3. Try Robaxin for back pain and decrease Dilaudid from q.3h. to q.4-6h.     p.r.n.; gradually wean the patient off this medication.  4. Protonix 40 mg p.o. daily.  5. Discontinue Zofran.  6. Porphyria screen if all the other labs are unrevealing.   DISCUSSION:  Mrs. [name] is a 35 year old African-American female  who has had multiple medical problems and surgeries.  She has recently moved  to [place] from [place] where she has had the above-mentioned surgeries.  Her last surgical procedure was a laparoscopic cholecystectomy done for  normal abdominal ultrasound and HIDA scan in  view of the fact that she had  right upper quadrant pain with nausea.  She said for four days after her  surgery she was doing well until her symptoms recurred.  She describes a  sudden onset of nausea and vomiting, with severe periumbilical pain on  October 26, 2003 requiring her to come to the emergency room for  dehydration and control of pain.  The patient denies any hematochezia or  melena, fevers, chills, or rigors associated with these symptoms.  She says  her symptoms have become progressively worse through her hospitalization and  even though her right upper quadrant pain has completely resolved she  continues to have persistent nausea.  Her appetite has decreased but she  seems to be eating her meals adequately as per the nursing staff.  She  denies any genitourinary complaints.  She has some shortness of breath and  at times finds it difficult to breath and is receiving breathing  treatments during this hospitalization.   ALLERGIES:  No known drug allergies.   MEDICATIONS:  Percocet and Tylenol.   PAST MEDICAL HISTORY:  See list above.   SOCIAL HISTORY:  She is married and lives with her husband.  She has  recently relocated to [place], [place].  She has two children.  She does not smoke, drink alcohol, or use street drugs.   FAMILY HISTORY:  Her father is 94 and has had a history of bleeding ulcers.  Her mother is 59 and has uterine fibroids and had some endometrial  dysplasia.  She has a 25 year old brother and a 21 year old sister who are  healthy.  There is no known family history of colon, breast, or ovarian  cancer.  Her paternal grandmother has heart disease.  There is history of  diabetes in several members on both sides of the family.   PHYSICAL EXAMINATION:  GENERAL:  Reveals an anxious, talkative, young, obese  African-American female in no acute distress with stable vital signs.  The  patient is afebrile.  HEENT:  AT/[place], PERRLA.  Oropharyngeal  mucosa without exudate.  NECK:  Supple.  No JVD, thyromegaly, or lymphadenopathy.  CHEST:  Clear to auscultation, S1, S2, regular.  No murmur, rub, or gallop,  rales, rhonchi, or wheezing.  ABDOMEN:  Obese, nondistended, with multiple surgical scars from previous  appendectomy and hysterectomy and the right inguinal hernia repair.  Mild  abdominal tenderness on palpation in the periumbilical area with guarding.  No rebound or rigidity.  RECTAL:  Reveals moderate sphincter tone, guaiac negative brown stools.  No  masses are palpable on digital examination.  EXTREMITIES:  Show no cyanosis, clubbing, or edema.   LABORATORY EVALUATION:  On admission revealed a white count of 8.9 with  hematocrit of 38.5, hemoglobin of 13, platelet count was normal at 361.  Her  electrolytes have been steadily normal except for calcium being slightly  high at 10.7.  Her ALT was 46 but the other LFTs were all normal.  Her urine  was positive for opiates and negative with regards to leukocyte esterase or  white cells.  HIDA scan done during this hospitalization revealed no  evidence of biliary leak or obstruction.  Acute abdominal series showed an  element of constipation but no other abnormalities were noted.  CT scan of  the abdomen and pelvis done on admission revealed the patient to be status  post cholecystectomy with constipation but no source of abdominal pain or  nausea could be identified.  There was mild inflammatory change around the  umbilicus related to her previous laparoscopic cholecystectomy.   Considering this data, recommendations have been made for the recommendation  made in follow-up.                                               Anselmo Rod, M.D.    JNM/MEDQ  D:  11/02/2003  T:  11/02/2003  Job:  [id number]   cc:   Abigail Miyamoto, M.D.  [address]  [place]  [place] [address]  Fax: [phone]   Jackie Plum, M.D.

## 2011-05-01 NOTE — Op Note (Signed)
[name], [name]                         ACCOUNT NO.:  [id number]   MEDICAL RECORD NO.:  [id number]                   PATIENT TYPE:  OIB   LOCATION:  2899                                 FACILITY:  [facility]   PHYSICIAN:  Abigail Miyamoto, M.D.              DATE OF BIRTH:  [birth year]   DATE OF PROCEDURE:  10/22/2003  DATE OF DISCHARGE:                                 OPERATIVE REPORT   PREOPERATIVE DIAGNOSIS:  Biliary dyskinesia.   POSTOPERATIVE DIAGNOSIS:  Biliary dyskinesia.   OPERATION PERFORMED:  Laparoscopic cholecystectomy.   SURGEON:  Douglas A. Magnus Ivan, M.D.   ANESTHESIA:  General endotracheal. 0.25% Marcaine.,   ESTIMATED BLOOD LOSS:  Minimal.   DESCRIPTION OF PROCEDURE:  The patient was brought to the operating room and  identified as [name].  She was placed supine on the operating table  and general anesthesia was induced.  Her abdomen was then prepped and draped  in the usual sterile fashion.  Using a #15 blade a small transverse incision  was made below the umbilicus.  The incision was carried down to the fascia  which was then opened with a scalpel.  Hemostat was then used to pass into  the peritoneal cavity.  The 0 Vicryl pursestring suture was then placed  around the fascial opening.  The Hasson port was placed through the opening  and insufflation of the abdomen was begun.  Next, a 12 mm port was placed in  the patient's epigastrium and two 5 mm ports were placed in the patient's  right flank under direct vision.  The gallbladder was then grasped and  retracted above the liver bed.  The cystic duct was easily dissected out and  clipped once distally.  A small incision was then made in the cystic duct  with laparoscopic scissors.  A cholangiocatheter was inserted through a  small skin incision in the right upper quadrant.  The cholangiocatheter was  bigger than the cystic duct.  It could not be passed down it.  Therefore,  given this, decision was  made to forego cholangiogram.  The cystic duct was  then clipped three times proximally and transected with scissors.  The  cystic artery was then identified and clipped twice proximally and once  distally and transected as well.  The gallbladder was then slowly dissected  free from the liver bed with the electrocautery.  Once the gallbladder was  freed from the liver bed, hemostasis was achieved in the liver bed with  cautery.  The gallbladder was then placed in an Endo sac and removed through  the incision at the umbilicus.  The 0 Vicryl at the umbilicus was then tied  in place closing the fascial defect.  The abdomen was then copiously  irrigated with normal saline.  Again hemostasis appeared to be achieved.  All ports were removed under direct vision and the abdomen was deflated.  All incisions were then  anesthetized with 0.25% Marcaine and closed with 4-0  Monocryl subcuticular sutures.  Steri-Strips, gauze and tape  were then applied.  The patient tolerated the procedure well.  All sponge,  needle and instrument counts were correct at the end of the procedure.  The  patient was then extubated in the operating room and taken in stable  condition to the recovery room.                                               Abigail Miyamoto, M.D.    DB/MEDQ  D:  10/22/2003  T:  10/22/2003  Job:  [id number]   cc:   Anselmo Rod, M.D.  [address]  [address]  [place]  [place] [address]  Fax: [phone]

## 2011-05-01 NOTE — H&P (Signed)
NAME:  Meghan, [name] NO.:  [id number]   MEDICAL RECORD NO.:  [id number]                   PATIENT TYPE:   LOCATION:                                       FACILITY:   PHYSICIAN:  Hollice Espy, M.D.            DATE OF BIRTH:  07/30/1976   DATE OF ADMISSION:  10/29/2003  DATE OF DISCHARGE:                                HISTORY & PHYSICAL   CHIEF COMPLAINT:  A 35 year old African-American female with a past medical  history of multiple abdominal surgeries, including abdominal hysterectomy,  gallbladder removal, appendix removal, who has chronic abdominal pain who  presents to the ER today with similar symptoms.  The patient recently had  her gallbladder taken out a few weeks prior on October 22, 2003, for biliary  dyskinesia, but even this was felt to be a mild condition in hopes to more  treat her symptomatically.  She denied any postoperative problems until  three days ago when she ended up having intermittent periumbilical pain with  nausea and vomiting.  The pain progressively worsened and she came to the ED  today.  She had subjective fever and chills.  She received multiple doses of  Dilaudid since being in the ED, and also does report a bowel movement, says  her bowels move regularly, but she still states that she is having severe  nausea and pain.  A CT of the abdomen was done which was essentially  negative, although did note a large amount of stool.  Lab work was otherwise  essentially unremarkable, including an amylase and lipase.  The patient also  of note, her calcium level was slightly elevated at 10.7.  Previous review  of her calcium levels, they have always been a high in the 9.9 to 10.7.  The  patient otherwise denies any other complaints.  She denies any headaches,  visual changes, hearing loss, dysphagia, chest pain or palpitations,  shortness of breath, wheeze, cough.  She does complain of some continued  abdominal pain.  She  denies any extremity weakness.  No hematuria, dysuria.  She denies any constipation as well or any diarrhea.   PAST MEDICAL HISTORY:  Multiple abdominal surgeries, including hysterectomy,  cholecystectomy secondary to questionable biliary dyskinesia, appendix  removal.   MEDICATIONS:  1. Chronic Percocet  2. Tylenol.   No other medications.   ALLERGIES:  NO KNOWN DRUG ALLERGIES.   SOCIAL HISTORY:  She denies any tobacco, drugs, or alcohol.   FAMILY HISTORY:  Noncontributory.   PHYSICAL EXAMINATION:  VITAL SIGNS:  Her vitals on admission:  Temperature  96.4, blood pressure 126/88, heart rate 63, respirations 20, O2 sat 98% on  room air.  GENERAL:  This is an obese African-American female who looks her stated age.  Alert and oriented x3.  In mild distress.  HEENT:  Normocephalic, atraumatic.  Mucous membranes are dry.  No carotid  bruits.  HEART:  Regular rate  and rhythm.  S1 and S2.  LUNGS:  Clear to auscultation bilaterally.  ABDOMEN:  Distended, soft, tender generally throughout, but especially  around the periumbilical area.  Hypoactive bowel sounds.  EXTREMITIES:  No cyanosis, clubbing, or edema.   LABORATORY DATA:  CT is as mentioned.  Sodium 132, potassium 3.8, chloride  106, bicarbonate 29, BUN 7, creatinine 10.7, glucose 97, calcium is 10.7.  White count 8.9, H&H 13 and 38.5, MCV of 87, platelet count 361, 49%  neutrophils.  UA is negative.  Urine pregnancy test is negative.  LFT's:  She has an ALT of 46, but otherwise her labs are completely normal,  including bilirubin, amylase of 41, lipase of 12.   Urine drug screen is pending.   ASSESSMENT AND PLAN:  This is a 35 year old African-American female with a  past medical history of multiple abdominal surgeries and chronic abdominal  pain who presents to the emergency department with similar symptoms.  She is  evaluated by surgery in the emergency department and no evidence of  obstruction.  CT of the abdomen and  pelvis done showing a large amount of  stool, calcium level of 10.7.  1. Chronic abdominal pain.  The patient is seen by surgery who feels this is     not a surgerical issue.  We will give pain and nausea control.  Clean out     her bowels with Colace and mag citrate.  Suspect chronic pain medications     at home, which she is on Percocet, is retarding her bowel movements.  Do     suspect there might be a mild psychological component as well.  2. Increased calcium level.  It is questionable if this is     hyperparathyroidism.  We will check an intact PTH level, as well as an     ionized calcium level.  I do suspect the initial calcium level is     accurate, as previous labs are similar to this.  If so, this may be a     source of her vague abdominal pains.                                               Hollice Espy, M.D.    SKK/MEDQ  D:  10/29/2003  T:  10/29/2003  Job:  [id number]   cc:   Adolph Pollack, M.D.  [address]  [place]  [place] [address]  Fax: [phone]

## 2011-06-24 ENCOUNTER — Emergency Department (HOSPITAL_COMMUNITY): Payer: Self-pay

## 2011-06-24 ENCOUNTER — Emergency Department (HOSPITAL_COMMUNITY)
Admission: EM | Admit: 2011-06-24 | Discharge: 2011-06-25 | Disposition: A | Discharge status: Home / Self Care | Payer: Self-pay | Attending: Emergency Medicine | Admitting: Emergency Medicine

## 2011-06-24 ENCOUNTER — Emergency Department (HOSPITAL_COMMUNITY)
Admission: EM | Admit: 2011-06-24 | Discharge: 2011-06-24 | Disposition: A | Discharge status: Home / Self Care | Payer: Self-pay | Attending: Emergency Medicine | Admitting: Emergency Medicine

## 2011-06-24 DIAGNOSIS — R609 Edema, unspecified: Secondary | ICD-10-CM | POA: Insufficient documentation

## 2011-06-24 DIAGNOSIS — R1032 Left lower quadrant pain: Secondary | ICD-10-CM | POA: Insufficient documentation

## 2011-06-24 DIAGNOSIS — R062 Wheezing: Secondary | ICD-10-CM | POA: Insufficient documentation

## 2011-06-24 DIAGNOSIS — R109 Unspecified abdominal pain: Secondary | ICD-10-CM | POA: Insufficient documentation

## 2011-06-24 DIAGNOSIS — R197 Diarrhea, unspecified: Secondary | ICD-10-CM | POA: Insufficient documentation

## 2011-06-24 DIAGNOSIS — R112 Nausea with vomiting, unspecified: Secondary | ICD-10-CM | POA: Insufficient documentation

## 2011-06-24 DIAGNOSIS — M7989 Other specified soft tissue disorders: Secondary | ICD-10-CM | POA: Insufficient documentation

## 2011-06-24 LAB — DIFFERENTIAL
Basophils Absolute: 0 10*3/uL (ref 0.0–0.1)
Basophils Relative: 0 % (ref 0–1)
Eosinophils Relative: 4 % (ref 0–5)
Lymphocytes Relative: 37 % (ref 12–46)
Lymphs Abs: 1.8 10*3/uL (ref 0.7–4.0)
Monocytes Relative: 4 % (ref 3–12)
Neutro Abs: 2.6 10*3/uL (ref 1.7–7.7)

## 2011-06-24 LAB — CBC
HCT: 36.3 % (ref 36.0–46.0)
Hemoglobin: 11.9 g/dL — ABNORMAL LOW (ref 12.0–15.0)
MCH: 29.3 pg (ref 26.0–34.0)
MCHC: 32.8 g/dL (ref 30.0–36.0)
MCV: 89.4 fL (ref 78.0–100.0)
RBC: 4.06 MIL/uL (ref 3.87–5.11)

## 2011-06-24 LAB — BASIC METABOLIC PANEL
BUN: 10 mg/dL (ref 6–23)
CO2: 21 mEq/L (ref 19–32)
Calcium: 8.8 mg/dL (ref 8.4–10.5)
GFR calc non Af Amer: >60 mL/min (ref >60)
Glucose, Bld: 101 mg/dL — ABNORMAL HIGH (ref 70–99)

## 2011-06-24 LAB — URINALYSIS, ROUTINE W REFLEX MICROSCOPIC
Bilirubin Urine: NEGATIVE
Hgb urine dipstick: NEGATIVE
Nitrite: NEGATIVE
pH: 6 (ref 5.0–8.0)

## 2011-06-24 MED ORDER — IOHEXOL 300 MG/ML  SOLN
125.0000 mL | Freq: Once | INTRAMUSCULAR | Status: AC | PRN
  Administered 2011-06-24: 125 mL via INTRAVENOUS

## 2011-06-25 ENCOUNTER — Ambulatory Visit (HOSPITAL_COMMUNITY)
Admission: RE | Admit: 2011-06-25 | Discharge: 2011-06-25 | Disposition: A | Discharge status: Home / Self Care | Payer: Self-pay | Source: Ambulatory Visit | Attending: Emergency Medicine | Admitting: Emergency Medicine

## 2011-06-25 DIAGNOSIS — M79609 Pain in unspecified limb: Secondary | ICD-10-CM

## 2011-06-25 DIAGNOSIS — M7989 Other specified soft tissue disorders: Secondary | ICD-10-CM | POA: Insufficient documentation

## 2011-09-04 LAB — DIFFERENTIAL
Basophils Absolute: 0
Basophils Relative: 0
Eosinophils Absolute: 0.2
Monocytes Absolute: 0.2
Monocytes Relative: 3
Neutro Abs: 3.9
Neutrophils Relative %: 55

## 2011-09-04 LAB — URINALYSIS, ROUTINE W REFLEX MICROSCOPIC
Bilirubin Urine: NEGATIVE
Hgb urine dipstick: NEGATIVE
Nitrite: NEGATIVE
Protein, ur: NEGATIVE
Urobilinogen, UA: 1

## 2011-09-04 LAB — CBC
Hemoglobin: 12.9
MCHC: 34.6
MCV: 86.3
RBC: 4.33
RDW: 13.4

## 2011-09-04 LAB — BASIC METABOLIC PANEL
CO2: 23
Calcium: 9.3
Chloride: 102
Creatinine, Ser: 0.69
GFR calc Af Amer: >60
Glucose, Bld: 103 — ABNORMAL HIGH

## 2011-09-10 LAB — CBC
MCHC: 33.4
Platelets: 262
RDW: 13.9

## 2011-09-10 LAB — DIFFERENTIAL
Basophils Absolute: 0
Basophils Relative: 0
Lymphocytes Relative: 32
Monocytes Absolute: 0.3
Neutro Abs: 4.1
Neutrophils Relative %: 59

## 2011-09-10 LAB — BASIC METABOLIC PANEL
BUN: 5 — ABNORMAL LOW
CO2: 27
Calcium: 9.2
Creatinine, Ser: 0.63
GFR calc non Af Amer: >60
Glucose, Bld: 96

## 2011-09-14 LAB — POCT RAPID STREP A: Streptococcus, Group A Screen (Direct): NEGATIVE

## 2011-09-16 LAB — POCT I-STAT, CHEM 8
Calcium, Ion: 1.21
HCT: 41
Hemoglobin: 13.9
Sodium: 138
TCO2: 27

## 2011-09-16 LAB — POCT CARDIAC MARKERS
CKMB, poc: 2.1
Myoglobin, poc: 80.1

## 2011-09-16 LAB — DIFFERENTIAL
Basophils Absolute: 0
Basophils Relative: 1
Lymphocytes Relative: 37
Monocytes Relative: 5
Neutro Abs: 2
Neutrophils Relative %: 51

## 2011-09-16 LAB — CBC
MCHC: 33.5
RBC: 4.23

## 2011-11-07 ENCOUNTER — Encounter: Payer: Self-pay | Admitting: Emergency Medicine

## 2011-11-07 ENCOUNTER — Emergency Department (HOSPITAL_COMMUNITY)
Admission: EM | Admit: 2011-11-07 | Discharge: 2011-11-07 | Disposition: A | Discharge status: Home / Self Care | Payer: Self-pay | Attending: Emergency Medicine | Admitting: Emergency Medicine

## 2011-11-07 ENCOUNTER — Emergency Department (HOSPITAL_COMMUNITY): Payer: Self-pay

## 2011-11-07 DIAGNOSIS — G8929 Other chronic pain: Secondary | ICD-10-CM | POA: Insufficient documentation

## 2011-11-07 DIAGNOSIS — M545 Low back pain, unspecified: Secondary | ICD-10-CM | POA: Insufficient documentation

## 2011-11-07 DIAGNOSIS — R109 Unspecified abdominal pain: Secondary | ICD-10-CM | POA: Insufficient documentation

## 2011-11-07 DIAGNOSIS — M549 Dorsalgia, unspecified: Secondary | ICD-10-CM

## 2011-11-07 LAB — DIFFERENTIAL
Eosinophils Relative: 4 % (ref 0–5)
Lymphocytes Relative: 32 % (ref 12–46)
Lymphs Abs: 2 10*3/uL (ref 0.7–4.0)
Neutrophils Relative %: 58 % (ref 43–77)

## 2011-11-07 LAB — URINALYSIS, ROUTINE W REFLEX MICROSCOPIC
Glucose, UA: NEGATIVE mg/dL
Hgb urine dipstick: NEGATIVE
Specific Gravity, Urine: 1.036 — ABNORMAL HIGH (ref 1.005–1.030)
Urobilinogen, UA: 0.2 mg/dL (ref 0.0–1.0)
pH: 5.5 (ref 5.0–8.0)

## 2011-11-07 LAB — COMPREHENSIVE METABOLIC PANEL
ALT: 11 U/L (ref 0–35)
Alkaline Phosphatase: 99 U/L (ref 39–117)
CO2: 28 mEq/L (ref 19–32)
GFR calc Af Amer: >90 mL/min (ref >90)
GFR calc non Af Amer: >90 mL/min (ref >90)
Glucose, Bld: 101 mg/dL — ABNORMAL HIGH (ref 70–99)
Potassium: 3.7 mEq/L (ref 3.5–5.1)
Sodium: 137 mEq/L (ref 135–145)

## 2011-11-07 LAB — CBC
Hemoglobin: 12.1 g/dL (ref 12.0–15.0)
MCV: 91.1 fL (ref 78.0–100.0)
Platelets: 301 10*3/uL (ref 150–400)
RBC: 4.17 MIL/uL (ref 3.87–5.11)
WBC: 6.2 10*3/uL (ref 4.0–10.5)

## 2011-11-07 MED ORDER — DIAZEPAM 5 MG PO TABS: ORAL_TABLET | ORAL | Status: AC

## 2011-11-07 MED ORDER — FENTANYL CITRATE 0.05 MG/ML IJ SOLN
100.0000 ug | Freq: Once | INTRAMUSCULAR | Status: AC
  Administered 2011-11-07: 100 ug via INTRAVENOUS
  Filled 2011-11-07: qty 2

## 2011-11-07 MED ORDER — SODIUM CHLORIDE 0.9 % IV BOLUS (SEPSIS)
1000.0000 mL | Freq: Once | INTRAVENOUS | Status: AC
  Administered 2011-11-07: 1000 mL via INTRAVENOUS

## 2011-11-07 MED ORDER — KETOROLAC TROMETHAMINE 30 MG/ML IJ SOLN
30.0000 mg | Freq: Once | INTRAMUSCULAR | Status: AC
  Administered 2011-11-07: 30 mg via INTRAVENOUS
  Filled 2011-11-07: qty 1

## 2011-11-07 MED ORDER — ACETAMINOPHEN-CODEINE #3 300-30 MG PO TABS: 1.0000 | ORAL_TABLET | Freq: Four times a day (QID) | ORAL | Status: AC | PRN

## 2011-11-07 MED ORDER — DIAZEPAM 5 MG/ML IJ SOLN
5.0000 mg | Freq: Once | INTRAMUSCULAR | Status: AC
  Administered 2011-11-07: 5 mg via INTRAVENOUS
  Filled 2011-11-07: qty 2

## 2011-11-07 MED ORDER — ONDANSETRON HCL 4 MG/2ML IJ SOLN
4.0000 mg | Freq: Once | INTRAMUSCULAR | Status: AC
  Administered 2011-11-07: 4 mg via INTRAVENOUS
  Filled 2011-11-07: qty 2

## 2011-11-07 MED ORDER — IBUPROFEN 800 MG PO TABS: 800.0000 mg | ORAL_TABLET | Freq: Three times a day (TID) | ORAL | Status: AC

## 2011-11-07 NOTE — ED Notes (Signed)
MD at bedside. 

## 2011-11-07 NOTE — ED Provider Notes (Signed)
Medical screening examination/treatment/procedure(s) were performed by non-physician practitioner and as supervising physician I was immediately available for consultation/collaboration. Devoria Albe, MD, Armando Gang   Ward Givens, MD 11/07/11 [phone]

## 2011-11-07 NOTE — ED Notes (Addendum)
Patient transported to and from X-ray via Doctor, general practice with tech

## 2011-11-07 NOTE — ED Notes (Signed)
Pt presented to the ER with c/o left lower back pain, abd pain, left upper and lower abd, 8/10, pt reports that s/s started about 2 days ago and pain worsen over time, pt took ASA, but did not note the relief. Also states that secondary to pain not able to have a good night rest, pt reports increasing fluid intake, states "i thought it could be kidney stones", also reports n/v in the last 24 hr about 7-8times

## 2011-11-07 NOTE — ED Provider Notes (Signed)
History     CSN: [id number] Arrival date & time: 11/07/2011  5:20 AM   First MD Initiated Contact with Patient 11/07/11 0750      Chief Complaint  Patient presents with  . Back Pain    left lower  . Nausea  . Abdominal Pain    (Consider location/radiation/quality/duration/timing/severity/associated sxs/prior treatment) HPI  Patient presents to emergency department complaining of abdominal pain, nausea, vomiting, and diarrhea. Patient has significant history of hysterectomy/oophorectomy, cholecystectomy, appendectomy, chronic abdominal pain due to adhesions states that for one year she has had intermittent fleeting left lower quadrant pain that she describes as a sharp fleeting pain that is sporadic throughout the month. She states she can go many days without having pain and then will have sporadic return of pain. Patient states pain lasts seconds. Patient denies any LLQ pain currently. Patient also complaining of a 2 day history of gradual onset left upper quadrant pain and left lower back pain. Patient states the pain is constant and aggravated by movement. Patient notes that over the last 24-hour she has had inability to hold down any fluids due to vomiting. Patient notes that she has also had a few episodes of loose stool. She denies known fevers, chills, chest pain, shortness of breath, dysuria, hematuria, blood in her stool. Patient states she took aspirin without relief of pain.  History reviewed. No pertinent past medical history.  Past Surgical History  Procedure Date  . Hernia repair   . Abdominal hysterectomy   . Cystectomy   . Cholecystectomy   . Tubal ligation   . Breast reduction surgery   . Appendectomy     History reviewed. No pertinent family history.  History  Substance Use Topics  . Smoking status: Never Smoker   . Smokeless tobacco: Not on file  . Alcohol Use: No    OB History    Grav Para Term Preterm Abortions TAB SAB Ect Mult Living                  Review of Systems  All other systems reviewed and are negative.    Allergies  Metoclopramide and Tramadol  Home Medications   Current Outpatient Rx  Name Route Sig Dispense Refill  . ASPIRIN 325 MG PO TABS Oral Take 325 mg by mouth daily.        BP 109/75  Pulse 63  Temp(Src) 97.5 F (36.4 C) (Oral)  Resp 17  SpO2 99%  Physical Exam  Nursing note and vitals reviewed. Constitutional: She is oriented to person, place, and time. She appears well-developed and well-nourished. No distress.  HENT:  Head: Normocephalic and atraumatic.  Eyes: Conjunctivae and EOM are normal. Pupils are equal, round, and reactive to light.  Neck: Normal range of motion. Neck supple.  Cardiovascular: Normal rate, regular rhythm, normal heart sounds and intact distal pulses.  Exam reveals no gallop and no friction rub.   No murmur heard. Pulmonary/Chest: Effort normal and breath sounds normal. No respiratory distress. She has no wheezes. She has no rales. She exhibits no tenderness.  Abdominal: Bowel sounds are normal. She exhibits no distension, no ascites and no mass. There is tenderness in the left upper quadrant. There is no rigidity, no rebound and no guarding.       No peritoneal signs or rebound tenderness to palpation  Musculoskeletal: Normal range of motion. She exhibits no edema and no tenderness.       Lumbar back: She exhibits tenderness.  Mild soft tissue tenderness to palpation of left lower back. No spinal tenderness to palpation but lumbar paraspinal TTP on the left side. No skin changes or crepitus.   Neurological: She is alert and oriented to person, place, and time. She has normal reflexes.  Skin: Skin is warm and dry. No rash noted. She is not diaphoretic. No erythema.  Psychiatric: She has a normal mood and affect.    ED Course  Procedures (including critical care time)  Labs Reviewed  URINALYSIS, ROUTINE W REFLEX MICROSCOPIC - Abnormal; Notable for the following:     Specific Gravity, Urine 1.036 (*)    Ketones, ur TRACE (*)    All other components within normal limits  COMPREHENSIVE METABOLIC PANEL - Abnormal; Notable for the following:    Glucose, Bld 101 (*)    Total Bilirubin 0.2 (*)    All other components within normal limits  CBC  DIFFERENTIAL  LIPASE, BLOOD   Dg Abd Acute W/chest  11/07/2011  *RADIOLOGY REPORT*  Clinical Data: Abdominal pain  ACUTE ABDOMEN SERIES (ABDOMEN 2 VIEW & CHEST 1 VIEW)  Comparison: CT of 06/24/2011  Findings: The frontal chest radiograph is clear.  No free air on the erect film.  Vascular clips in the right upper abdomen.  Small bowel decompressed.  Moderate fecal material in the proximal colon, decompressed distally.  No abnormal abdominal calcifications. Regional bones unremarkable.  IMPRESSION:  1.  No acute cardiopulmonary disease. 2.  No free air. 3.  Nonobstructive bowel gas pattern with moderate proximal colonic fecal material.  Original Report Authenticated By: Thora Lance III, M.D.     1. Abdominal  pain, other specified site   2. Back pain       MDM  Patient with history of chronic abdominal pain with no acute findings on today's labs and acute abdominal series with no peritoneal signs to suggest acute abdomen. Patient afebrile and in no acute distress. She is now tolerating fluids well. Patient has had numerous CT scans over the last few years and therefore we'll not  scan her today because of benign appearing abdomen and no acute findings on labs or acute abdominal series. Last CT scan of abdomen was in July of 2012 without any acute findings or findings suggestive of of kidney stones. Spoke at length with patient about changing or worsening symptoms it should warrent return to emergency department and she voices her understanding. Patient states she will follow up with Dr. Magnus Ivan, the general surgeon who she last saw, for further evaluation and management of her abdominal pain as well as her  primary care physician.        Jenness Corner, [place] 11/07/11 1045

## 2012-09-12 ENCOUNTER — Emergency Department (HOSPITAL_COMMUNITY)
Admission: EM | Admit: 2012-09-12 | Discharge: 2012-09-12 | Disposition: A | Discharge status: Home / Self Care | Payer: Self-pay | Attending: Emergency Medicine | Admitting: Emergency Medicine

## 2012-09-12 ENCOUNTER — Emergency Department (HOSPITAL_COMMUNITY): Payer: Self-pay

## 2012-09-12 ENCOUNTER — Encounter (HOSPITAL_COMMUNITY): Payer: Self-pay | Admitting: Emergency Medicine

## 2012-09-12 DIAGNOSIS — J45909 Unspecified asthma, uncomplicated: Secondary | ICD-10-CM | POA: Insufficient documentation

## 2012-09-12 DIAGNOSIS — R112 Nausea with vomiting, unspecified: Secondary | ICD-10-CM | POA: Insufficient documentation

## 2012-09-12 DIAGNOSIS — Z79899 Other long term (current) drug therapy: Secondary | ICD-10-CM | POA: Insufficient documentation

## 2012-09-12 DIAGNOSIS — R109 Unspecified abdominal pain: Secondary | ICD-10-CM | POA: Insufficient documentation

## 2012-09-12 HISTORY — DX: Unspecified asthma, uncomplicated: J45.909

## 2012-09-12 LAB — URINALYSIS, ROUTINE W REFLEX MICROSCOPIC
Glucose, UA: NEGATIVE mg/dL
Ketones, ur: NEGATIVE mg/dL
Leukocytes, UA: NEGATIVE
pH: 6 (ref 5.0–8.0)

## 2012-09-12 LAB — CBC WITH DIFFERENTIAL/PLATELET
Basophils Relative: 0 % (ref 0–1)
Eosinophils Absolute: 0.3 10*3/uL (ref 0.0–0.7)
MCH: 28.7 pg (ref 26.0–34.0)
MCHC: 31.9 g/dL (ref 30.0–36.0)
Neutrophils Relative %: 58 % (ref 43–77)
Platelets: 301 10*3/uL (ref 150–400)

## 2012-09-12 LAB — BASIC METABOLIC PANEL
BUN: 8 mg/dL (ref 6–23)
CO2: 27 mEq/L (ref 19–32)
Chloride: 101 mEq/L (ref 96–112)
Glucose, Bld: 91 mg/dL (ref 70–99)
Potassium: 4 mEq/L (ref 3.5–5.1)

## 2012-09-12 LAB — CBC
HCT: 37.1 % (ref 36.0–46.0)
Hemoglobin: 12.1 g/dL (ref 12.0–15.0)
MCHC: 32.6 g/dL (ref 30.0–36.0)
RBC: 4.12 MIL/uL (ref 3.87–5.11)

## 2012-09-12 LAB — COMPREHENSIVE METABOLIC PANEL
ALT: 13 U/L (ref 0–35)
Albumin: 3.4 g/dL — ABNORMAL LOW (ref 3.5–5.2)
Alkaline Phosphatase: 109 U/L (ref 39–117)
BUN: 8 mg/dL (ref 6–23)
Potassium: 3.6 mEq/L (ref 3.5–5.1)
Sodium: 139 mEq/L (ref 135–145)
Total Protein: 7.4 g/dL (ref 6.0–8.3)

## 2012-09-12 MED ORDER — DICYCLOMINE HCL 20 MG PO TABS: 20.0000 mg | ORAL_TABLET | Freq: Two times a day (BID) | ORAL | Status: DC

## 2012-09-12 MED ORDER — IOHEXOL 300 MG/ML  SOLN
100.0000 mL | Freq: Once | INTRAMUSCULAR | Status: AC | PRN
  Administered 2012-09-12: 100 mL via INTRAVENOUS

## 2012-09-12 MED ORDER — HYDROCODONE-ACETAMINOPHEN 5-325 MG PO TABS: 2.0000 | ORAL_TABLET | ORAL | Status: DC | PRN

## 2012-09-12 MED ORDER — ONDANSETRON HCL 4 MG/2ML IJ SOLN
4.0000 mg | Freq: Once | INTRAMUSCULAR | Status: AC
  Administered 2012-09-12: 4 mg via INTRAVENOUS
  Filled 2012-09-12: qty 2

## 2012-09-12 MED ORDER — MORPHINE SULFATE 4 MG/ML IJ SOLN
4.0000 mg | Freq: Once | INTRAMUSCULAR | Status: AC
  Administered 2012-09-12: 4 mg via INTRAVENOUS
  Filled 2012-09-12: qty 1

## 2012-09-12 MED ORDER — DICYCLOMINE HCL 10 MG/ML IM SOLN
20.0000 mg | Freq: Once | INTRAMUSCULAR | Status: AC
  Administered 2012-09-12: 20 mg via INTRAMUSCULAR
  Filled 2012-09-12: qty 2

## 2012-09-12 MED ORDER — SODIUM CHLORIDE 0.9 % IV SOLN
Freq: Once | INTRAVENOUS | Status: AC
  Administered 2012-09-12: 05:00:00 via INTRAVENOUS

## 2012-09-12 NOTE — ED Notes (Signed)
Pt states she has had abdominal spasms, nausea, vomiting, and diarrhea. Pt reports 10/10 abd  Pain, she was prescribed diazepam and Vicodin which is not controlling pain or spasms. Pt reports abdominal distention that occurs intermittently. Audible bowel sounds. NAD.

## 2012-09-12 NOTE — ED Provider Notes (Addendum)
History     CSN: [id number]  Arrival date & time 09/12/12  0028   First MD Initiated Contact with Patient 09/12/12 [phone]      Chief Complaint  Patient presents with  . Nausea  . Emesis  . Abdominal Pain    (Consider location/radiation/quality/duration/timing/severity/associated sxs/prior treatment) HPI Comments: Long standing Hx of abdominal cramping for which she has received valium without much result lately   The history is provided by the patient.    Past Medical History  Diagnosis Date  . Asthma     Past Surgical History  Procedure Date  . Hernia repair   . Abdominal hysterectomy   . Cystectomy   . Cholecystectomy   . Tubal ligation   . Breast reduction surgery   . Appendectomy     No family history on file.  History  Substance Use Topics  . Smoking status: Never Smoker   . Smokeless tobacco: Never Used  . Alcohol Use: No    OB History    Grav Para Term Preterm Abortions TAB SAB Ect Mult Living                  Review of Systems  Constitutional: Negative for fever.  Gastrointestinal: Positive for abdominal pain. Negative for nausea, vomiting, diarrhea, constipation, abdominal distention and anal bleeding.  Genitourinary: Negative for dysuria, flank pain and vaginal discharge.  Neurological: Positive for weakness.    Allergies  Diclofenac; Metoclopramide; and Tramadol  Home Medications   Current Outpatient Rx  Name Route Sig Dispense Refill  . ALBUTEROL SULFATE HFA 108 (90 BASE) MCG/ACT IN AERS Inhalation Inhale 2 puffs into the lungs every 6 (six) hours as needed. For shortness of breath    . DIAZEPAM 10 MG PO TABS Oral Take 10 mg by mouth 2 (two) times daily as needed. For arthritis    . HYDROCODONE-ACETAMINOPHEN 10-500 MG PO TABS Oral Take 1 tablet by mouth every 6 (six) hours as needed. For pain    . LOPERAMIDE HCL 2 MG PO CAPS Oral Take 2 mg by mouth 4 (four) times daily as needed.    Marland Kitchen DICYCLOMINE HCL 20 MG PO TABS Oral Take 1 tablet (20  mg total) by mouth 2 (two) times daily. 20 tablet 0    BP 134/85  Pulse 69  Temp 98.2 F (36.8 C) (Oral)  Resp 16  Ht 5\' 5"  (1.651 m)  Wt 260 lb (117.935 kg)  BMI 43.27 kg/m2  SpO2 99%  Physical Exam  Constitutional: She is oriented to person, place, and time. She appears well-developed and well-nourished.  HENT:  Head: Normocephalic.  Eyes: Pupils are equal, round, and reactive to light.  Neck: Normal range of motion.  Cardiovascular: Normal rate.   Pulmonary/Chest: Effort normal.  Abdominal: She exhibits no distension. There is no tenderness.  Musculoskeletal: Normal range of motion.  Neurological: She is alert and oriented to person, place, and time.    ED Course  Procedures (including critical care time)  Labs Reviewed  CBC WITH DIFFERENTIAL - Abnormal; Notable for the following:    Eosinophils Relative 6 (*)     All other components within normal limits  COMPREHENSIVE METABOLIC PANEL - Abnormal; Notable for the following:    Albumin 3.4 (*)     Total Bilirubin 0.1 (*)     All other components within normal limits  URINALYSIS, ROUTINE W REFLEX MICROSCOPIC - Abnormal; Notable for the following:    Specific Gravity, Urine 1.034 (*)  All other components within normal limits  CBC  BASIC METABOLIC PANEL   Ct Abdomen Pelvis W Contrast  09/12/2012  *RADIOLOGY REPORT*  Clinical Data: Abdominal pain  CT ABDOMEN AND PELVIS WITH CONTRAST  Technique:  Multidetector CT imaging of the abdomen and pelvis was performed following the standard protocol during bolus administration of intravenous contrast.  Contrast: OMNIPAQUE IOHEXOL 300 MG/ML  SOLN  Comparison: 11/07/2011 radiograph, 06/24/2011 CT  Findings: Bibasilar atelectasis or scarring.  Normal heart size. No pleural or pericardial effusion.  Unremarkable liver, spleen, pancreas, adrenal glands.  Absent gallbladder.  No biliary ductal dilatation.  Tiny too small further characterize hypodensity arising posteriorly from  the right kidney.  Otherwise, symmetric renal enhancement.  No hydronephrosis or hydroureter.  No bowel obstruction.  Absent appendix.  No CT evidence for colitis.  No free intraperitoneal air or fluid.  No lymphadenopathy.  Thin-walled bladder.  Absent uterus.  No adnexal mass.  Normal caliber aorta and branch vessels.  No acute osseous finding.  IMPRESSION: No acute abnormality identified by CT within the abdomen pelvis.   Original Report Authenticated By: Waneta Martins, M.D.      1. Abdominal cramping       MDM   Patient with chronic abdominal pain, with new sign of infection, obstruction, or change from last CT exam referred to a good, GI.  I've switched her from Valium for her spasms to Bentyl        Arman Filter, NP 09/12/12 1610  Arman Filter, NP 09/29/12 1119

## 2012-09-12 NOTE — ED Notes (Signed)
Patient transported to CT 

## 2012-09-12 NOTE — ED Provider Notes (Signed)
Medical screening examination/treatment/procedure(s) were performed by non-physician practitioner and as supervising physician I was immediately available for consultation/collaboration.  Juliet Rude. Rubin Payor, MD 09/12/12 2142

## 2012-09-29 NOTE — ED Provider Notes (Signed)
Medical screening examination/treatment/procedure(s) were performed by non-physician practitioner and as supervising physician I was immediately available for consultation/collaboration.  Meaghen Vecchiarelli R. Danthony Kendrix, MD 09/29/12 2358 

## 2012-11-13 ENCOUNTER — Inpatient Hospital Stay (HOSPITAL_COMMUNITY)
Admission: EM | Admit: 2012-11-13 | Discharge: 2012-11-14 | DRG: 202 | Disposition: A | Discharge status: Home / Self Care | Payer: Medicaid Other | Attending: Internal Medicine | Admitting: Internal Medicine

## 2012-11-13 ENCOUNTER — Emergency Department (HOSPITAL_COMMUNITY): Payer: Medicaid Other

## 2012-11-13 ENCOUNTER — Encounter (HOSPITAL_COMMUNITY): Payer: Self-pay | Admitting: Emergency Medicine

## 2012-11-13 ENCOUNTER — Encounter: Payer: Self-pay | Admitting: Family Medicine

## 2012-11-13 DIAGNOSIS — Z6841 Body Mass Index (BMI) 40.0 and over, adult: Secondary | ICD-10-CM

## 2012-11-13 DIAGNOSIS — M069 Rheumatoid arthritis, unspecified: Secondary | ICD-10-CM | POA: Diagnosis present

## 2012-11-13 DIAGNOSIS — Z79899 Other long term (current) drug therapy: Secondary | ICD-10-CM

## 2012-11-13 DIAGNOSIS — B349 Viral infection, unspecified: Secondary | ICD-10-CM

## 2012-11-13 DIAGNOSIS — B9789 Other viral agents as the cause of diseases classified elsewhere: Secondary | ICD-10-CM | POA: Diagnosis present

## 2012-11-13 DIAGNOSIS — R000 Tachycardia, unspecified: Secondary | ICD-10-CM

## 2012-11-13 DIAGNOSIS — J45901 Unspecified asthma with (acute) exacerbation: Secondary | ICD-10-CM

## 2012-11-13 DIAGNOSIS — Z23 Encounter for immunization: Secondary | ICD-10-CM

## 2012-11-13 DIAGNOSIS — J0110 Acute frontal sinusitis, unspecified: Secondary | ICD-10-CM

## 2012-11-13 DIAGNOSIS — J45909 Unspecified asthma, uncomplicated: Secondary | ICD-10-CM

## 2012-11-13 HISTORY — DX: Rheumatoid arthritis, unspecified: M06.9

## 2012-11-13 LAB — URINALYSIS, ROUTINE W REFLEX MICROSCOPIC
Bilirubin Urine: NEGATIVE
Hgb urine dipstick: NEGATIVE
Ketones, ur: 15 mg/dL — AB
Specific Gravity, Urine: 1.03 (ref 1.005–1.030)
Urobilinogen, UA: 1 mg/dL (ref 0.0–1.0)

## 2012-11-13 LAB — COMPREHENSIVE METABOLIC PANEL
ALT: 12 U/L (ref 0–35)
Alkaline Phosphatase: 110 U/L (ref 39–117)
BUN: 6 mg/dL (ref 6–23)
CO2: 26 mEq/L (ref 19–32)
Calcium: 9.7 mg/dL (ref 8.4–10.5)
GFR calc Af Amer: >90 mL/min (ref >90)
GFR calc non Af Amer: >90 mL/min (ref >90)
Glucose, Bld: 90 mg/dL (ref 70–99)
Potassium: 4.1 mEq/L (ref 3.5–5.1)
Sodium: 136 mEq/L (ref 135–145)

## 2012-11-13 LAB — CBC
HCT: 39.6 % (ref 36.0–46.0)
MCH: 29.2 pg (ref 26.0–34.0)
MCV: 87.6 fL (ref 78.0–100.0)
RDW: 13.2 % (ref 11.5–15.5)
WBC: 6.8 10*3/uL (ref 4.0–10.5)

## 2012-11-13 MED ORDER — ENOXAPARIN SODIUM 40 MG/0.4ML ~~LOC~~ SOLN
40.0000 mg | Freq: Every day | SUBCUTANEOUS | Status: DC
  Administered 2012-11-14: 40 mg via SUBCUTANEOUS
  Filled 2012-11-13 (×2): qty 0.4

## 2012-11-13 MED ORDER — GUAIFENESIN ER 600 MG PO TB12
600.0000 mg | ORAL_TABLET | Freq: Two times a day (BID) | ORAL | Status: DC
  Administered 2012-11-13 – 2012-11-14 (×2): 600 mg via ORAL
  Filled 2012-11-13 (×3): qty 1

## 2012-11-13 MED ORDER — ONDANSETRON HCL 4 MG/2ML IJ SOLN
4.0000 mg | Freq: Once | INTRAMUSCULAR | Status: AC
  Administered 2012-11-13: 4 mg via INTRAVENOUS
  Filled 2012-11-13: qty 2

## 2012-11-13 MED ORDER — TIOTROPIUM BROMIDE MONOHYDRATE 18 MCG IN CAPS
18.0000 ug | ORAL_CAPSULE | Freq: Every day | RESPIRATORY_TRACT | Status: DC
  Administered 2012-11-14: 18 ug via RESPIRATORY_TRACT
  Filled 2012-11-13: qty 5

## 2012-11-13 MED ORDER — ALBUTEROL SULFATE (5 MG/ML) 0.5% IN NEBU: 2.5000 mg | INHALATION_SOLUTION | RESPIRATORY_TRACT | Status: DC | PRN

## 2012-11-13 MED ORDER — ALBUTEROL SULFATE (5 MG/ML) 0.5% IN NEBU: 5.0000 mg | INHALATION_SOLUTION | Freq: Once | RESPIRATORY_TRACT | Status: DC

## 2012-11-13 MED ORDER — ALBUTEROL SULFATE (5 MG/ML) 0.5% IN NEBU
2.5000 mg | INHALATION_SOLUTION | RESPIRATORY_TRACT | Status: DC
  Administered 2012-11-13 – 2012-11-14 (×5): 2.5 mg via RESPIRATORY_TRACT
  Filled 2012-11-13 (×5): qty 0.5

## 2012-11-13 MED ORDER — HYDROMORPHONE HCL PF 1 MG/ML IJ SOLN
1.0000 mg | Freq: Once | INTRAMUSCULAR | Status: AC
  Administered 2012-11-13: 1 mg via INTRAVENOUS
  Filled 2012-11-13: qty 1

## 2012-11-13 MED ORDER — LORAZEPAM 2 MG/ML IJ SOLN
1.0000 mg | Freq: Once | INTRAMUSCULAR | Status: AC
  Administered 2012-11-13: 1 mg via INTRAVENOUS
  Filled 2012-11-13: qty 1

## 2012-11-13 MED ORDER — DICYCLOMINE HCL 20 MG PO TABS
20.0000 mg | ORAL_TABLET | Freq: Two times a day (BID) | ORAL | Status: DC
  Administered 2012-11-13 – 2012-11-14 (×2): 20 mg via ORAL
  Filled 2012-11-13 (×3): qty 1

## 2012-11-13 MED ORDER — METHYLPREDNISOLONE SODIUM SUCC 125 MG IJ SOLR
125.0000 mg | Freq: Once | INTRAMUSCULAR | Status: AC
  Administered 2012-11-13: 125 mg via INTRAVENOUS
  Filled 2012-11-13: qty 2

## 2012-11-13 MED ORDER — SODIUM CHLORIDE 0.9 % IJ SOLN
3.0000 mL | Freq: Two times a day (BID) | INTRAMUSCULAR | Status: DC
  Administered 2012-11-14: 01:00:00 via INTRAVENOUS

## 2012-11-13 MED ORDER — HYDROCODONE-ACETAMINOPHEN 10-325 MG PO TABS
1.0000 | ORAL_TABLET | Freq: Four times a day (QID) | ORAL | Status: DC | PRN
  Administered 2012-11-14: 2 via ORAL
  Filled 2012-11-13: qty 2

## 2012-11-13 MED ORDER — ALBUTEROL (5 MG/ML) CONTINUOUS INHALATION SOLN
10.0000 mg/h | INHALATION_SOLUTION | Freq: Once | RESPIRATORY_TRACT | Status: AC
  Administered 2012-11-13: 10 mg/h via RESPIRATORY_TRACT

## 2012-11-13 MED ORDER — ALBUTEROL SULFATE (5 MG/ML) 0.5% IN NEBU
INHALATION_SOLUTION | RESPIRATORY_TRACT | Status: AC
  Administered 2012-11-13: 10 mg/h via RESPIRATORY_TRACT
  Filled 2012-11-13: qty 2

## 2012-11-13 MED ORDER — DIAZEPAM 5 MG PO TABS: 10.0000 mg | ORAL_TABLET | Freq: Two times a day (BID) | ORAL | Status: DC | PRN

## 2012-11-13 NOTE — ED Notes (Signed)
Per Eden Emms RN CBC is a  morning lab collection.

## 2012-11-13 NOTE — ED Notes (Signed)
Attempted to call report. Receiving RN busy. 

## 2012-11-13 NOTE — ED Notes (Addendum)
Pt presents w/ several days of head cold and exacerbation of asthma unresolved by OTC meds and use of inhaler at home. Pt developed mid sternal chest pain at home last p.m. Nausea w/ emesis. Chest pain and breathing problem worsened, pt has episode of epistaxis this morning and felt it was time she seek additional help w/ her Sx.

## 2012-11-13 NOTE — ED Provider Notes (Signed)
History     CSN: [id number]  Arrival date & time 11/13/12  1556   First MD Initiated Contact with Patient 11/13/12 1714      Chief Complaint  Patient presents with  . Chest Pain  . Shortness of Breath  . Emesis    (Consider location/radiation/quality/duration/timing/severity/associated sxs/prior treatment) Patient is a 36 y.o. female presenting with chest pain, shortness of breath, and vomiting. The history is provided by the patient.  Chest Pain Primary symptoms include shortness of breath and vomiting.    Shortness of Breath  Associated symptoms include chest pain and shortness of breath.  Emesis    patient here complaining of coughing urinary symptoms x2 days. Has used over-the-counter medications without relief. Does have a history of asthma and has used her inhaler without relief. Does not some chest tightness worse with coughing and movement. Had to have some post tussive emesis. Denies any severe abdominal pain. Did have one episode of epistaxis this morning which has since resolved. Denies any other bleeding disorders. No easy bruising. No fever. No diarrhea.  Past Medical History  Diagnosis Date  . Asthma     Past Surgical History  Procedure Date  . Hernia repair   . Abdominal hysterectomy   . Cystectomy   . Cholecystectomy   . Tubal ligation   . Breast reduction surgery   . Appendectomy     History reviewed. No pertinent family history.  History  Substance Use Topics  . Smoking status: Never Smoker   . Smokeless tobacco: Never Used  . Alcohol Use: No    OB History    Grav Para Term Preterm Abortions TAB SAB Ect Mult Living                  Review of Systems  Respiratory: Positive for shortness of breath.   Cardiovascular: Positive for chest pain.  Gastrointestinal: Positive for vomiting.  All other systems reviewed and are negative.    Allergies  Bee venom; Diclofenac; Metoclopramide; and Tramadol  Home Medications   Current Outpatient  Rx  Name  Route  Sig  Dispense  Refill  . ALBUTEROL SULFATE HFA 108 (90 BASE) MCG/ACT IN AERS   Inhalation   Inhale 2 puffs into the lungs every 6 (six) hours as needed. For shortness of breath         . DIAZEPAM 10 MG PO TABS   Oral   Take 10 mg by mouth 2 (two) times daily as needed. For arthritis         . DICYCLOMINE HCL 20 MG PO TABS   Oral   Take 1 tablet (20 mg total) by mouth 2 (two) times daily.   20 tablet   0   . GUAIFENESIN ER 600 MG PO TB12   Oral   Take 600 mg by mouth 2 (two) times daily.         Marland Kitchen HYDROCODONE-ACETAMINOPHEN 10-500 MG PO TABS   Oral   Take 1 tablet by mouth every 6 (six) hours as needed. For pain         . PHENYLEPH-CPM-DM-APAP 04-14-09-325 MG PO CAPS   Oral   Take 2 tablets by mouth every 6 (six) hours as needed. For cough cold symptoms           BP 121/84  Pulse 90  Temp 98.4 F (36.9 C) (Oral)  Resp 19  SpO2 99%  Physical Exam  Nursing note and vitals reviewed. Constitutional: She is oriented to person, place,  and time. She appears well-developed and well-nourished.  Non-toxic appearance. No distress.  HENT:  Head: Normocephalic and atraumatic.  Eyes: Conjunctivae normal, EOM and lids are normal. Pupils are equal, round, and reactive to light.  Neck: Normal range of motion. Neck supple. No tracheal deviation present. No mass present.  Cardiovascular: Normal rate, regular rhythm and normal heart sounds.  Exam reveals no gallop.   No murmur heard. Pulmonary/Chest: Effort normal. No stridor. No respiratory distress. She has decreased breath sounds. She has wheezes. She has no rhonchi. She has no rales.  Abdominal: Soft. Normal appearance and bowel sounds are normal. She exhibits no distension. There is no tenderness. There is no rebound and no CVA tenderness.  Musculoskeletal: Normal range of motion. She exhibits no edema and no tenderness.  Neurological: She is alert and oriented to person, place, and time. She has normal  strength. No cranial nerve deficit or sensory deficit. GCS eye subscore is 4. GCS verbal subscore is 5. GCS motor subscore is 6.  Skin: Skin is warm and dry. No abrasion and no rash noted.  Psychiatric: She has a normal mood and affect. Her speech is normal and behavior is normal.    ED Course  Procedures (including critical care time)   Labs Reviewed  CBC  URINALYSIS, ROUTINE W REFLEX MICROSCOPIC  LIPASE, BLOOD  COMPREHENSIVE METABOLIC PANEL   No results found.   No diagnosis found.    MDM  Pt given tx for asthma exacerbation and will be admitted        Toy Baker, MD 11/19/12 [phone]

## 2012-11-13 NOTE — ED Notes (Signed)
Respiratory at bedside.

## 2012-11-13 NOTE — H&P (Signed)
[facility] History and Physical  [name] RUE:[id number] DOB: Apr 16, 1976 DOA: 11/13/2012  Referring physician: Freida Busman PCP: Erlinda Hong, MD  Specialists: none  Chief Complaint: SOb CP  HPI: [name] is a 36 y.o. female came to [facility] ed 11/13/2012 with a week long h/o feeling ill.  She started to have a runny nose and cough.  She took OTc meds like mucinex and alka seltzer cold  [facility] [employer] and got a cold and flu remedy.    Last night she started to feel [facility] and started to thorw up and deeloped CP's-she coulnd;t sleep the whole of the evening of 11/30 and she couldn;t breath-she felt some PAin behiond her R eyeb with swelling of her eye. She trialed her asthma pump only 11/30-Wheezing developed last night and she took her inhaler-she was taking 1200 mg of mucinex and the cold and flu pills.  N/V + sputum which is yellow  Started to have nausea and vomiting yesterday which was not post-tussive .  + diarrhoea as well, which she states was dark green, and the vomit became bilous-no food relationship to the vomiting or the diarrhea. She does have chronic abdominal pain and issues that have extensively been worked up in the past by Dr. Loreta Ave is controlled he  She also developed some CP as well she describes as being like a chest heaviness in the center of the chest all across the chest with a bandlike distribution. She states that nothing seems to relieve the pain and she carries a diagnosis of? Rheumatoid arthritis. She states that the Dilaudid given in the emergency room does not help this pain and she is requesting something for this.  Emergency room evaluation revealed an essentially normal his metabolic panel, lipase is 17, LFTs were normal, troponin was 0.00, complete blood count was normal, D-dimer was 0.34, sign 0.2 glucose is 90, Esther x-ray per my read shows some hyperinflation however no other acute findings and this is a two-view chest  x-ray  Review of Systems: 10 item organ system review was performed as per above   Past Medical History  Diagnosis Date  . Asthma     Admission 7/13/5 for Abd pain unclear etiology  S/p Lap chole 11/8/4  H/o chronic headache   Past Surgical History  Procedure Date  . Hernia repair   . Abdominal hysterectomy   . Cystectomy   . Cholecystectomy   . Tubal ligation   . Breast reduction surgery   . Appendectomy    Social History:  History   Social History Narrative   Her father is 41 and has had a history of bleeding ulcers. Her mother is 90 and has uterine fibroids and had some endometrial dysplasia.  She has a 12 year old brother and a 32 year old sister who are healthy.  There is no known family history of colon, breast, or ovarian cancer.  Her paternal grandmother has heart disease.  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 Bee Venom Anaphylaxis  . Diclofenac Swelling  . Metoclopramide Anxiety and Rash    "never give to me again"  . Tramadol Anxiety, Rash and Other (See Comments)    Reaction: "felt like I was about to lose my mind"     Family History  Problem Relation Age of Onset  .  Breast cancer Father     died  . Lupus Maternal Aunt   . Rheum arthritis Mother   . Rheum arthritis Father   . Rheum arthritis Maternal Grandmother   . Rheum arthritis Maternal Grandfather   . Rheum arthritis Paternal Grandfather   . Rheum arthritis Paternal Grandfather   . Rheum arthritis Paternal Grandmother     Prior to Admission medications   Medication Sig Start Date End Date Taking? Authorizing Provider  albuterol (PROVENTIL HFA;VENTOLIN HFA) 108 (90 BASE) MCG/ACT inhaler Inhale 2 puffs into the lungs every 6 (six) hours as needed. For shortness of breath   Yes Historical Provider, MD  diazepam (VALIUM) 10 MG tablet Take  10 mg by mouth 2 (two) times daily as needed. For arthritis   Yes Historical Provider, MD  dicyclomine (BENTYL) 20 MG tablet Take 1 tablet (20 mg total) by mouth 2 (two) times daily. 09/12/12  Yes Arman Filter, NP  guaiFENesin (MUCINEX) 600 MG 12 hr tablet Take 600 mg by mouth 2 (two) times daily.   Yes Historical Provider, MD  HYDROcodone-acetaminophen (LORTAB) 10-500 MG per tablet Take 1 tablet by mouth every 6 (six) hours as needed. For pain   Yes Historical Provider, MD  Phenyleph-CPM-DM-APAP (ALKA-SELTZER PLUS COLD & COUGH) 04-14-09-325 MG CAPS Take 2 tablets by mouth every 6 (six) hours as needed. For cough cold symptoms   Yes Historical Provider, MD   Physical Exam: Filed Vitals:   11/13/12 1654 11/13/12 1710 11/13/12 1736 11/13/12 1930  BP: 123/81 121/84    Pulse: 104 90    Temp: 98.4 F (36.9 C)     TempSrc: Oral     Resp: 20 19    SpO2: 98% 99% 99% 94%   Alert oriented pleasant African American female in some mild respiratory distress. Audible wheezing heard without school. Respiratory rate seems to be about 25 No pallor no icterus good dentition neck is soft and supple no JVD S1-S2 slightly tachycardic to the 1:15 to 120 range Abdomen soft but slightly tender in the upper epigastrium obese bowel sounds heard Motor grossly intact  Labs on Admission:  Basic Metabolic Panel:  Lab 11/13/12 1610  NA 136  K 4.1  CL 100  CO2 26  GLUCOSE 90  BUN 6  CREATININE 0.69  CALCIUM 9.7  MG --  PHOS --   Liver Function Tests:  Lab 11/13/12 1800  AST 20  ALT 12  ALKPHOS 110  BILITOT 0.5  PROT 8.1  ALBUMIN 3.6    Lab 11/13/12 1800  LIPASE 17  AMYLASE --   No results found for this basename: AMMONIA:5 in the last 168 hours CBC:  Lab 11/13/12 1800  WBC 6.8  NEUTROABS --  HGB 13.2  HCT 39.6  MCV 87.6  PLT 321   Cardiac Enzymes: No results found for this basename: CKTOTAL:5,CKMB:5,CKMBINDEX:5,TROPONINI:5 in the last 168 hours  BNP (last 3 results) No results found  for this basename: PROBNP:3 in the last 8760 hours CBG: No results found for this basename: GLUCAP:5 in the last 168 hours  Radiological Exams on Admission: Dg Chest 2 View (if Patient Has Fever And/or Copd)  11/13/2012  *RADIOLOGY REPORT*  Clinical Data: Chest pain, shortness of breath, cough, and vomiting.  CHEST - 2 VIEW  Comparison: 10/09/2009  Findings: Heart size and vascularity are normal and the lungs are clear.  No osseous abnormality.  IMPRESSION: Normal chest.   Original Report Authenticated By: Francene Boyers, M.D.     EKG: Independently  reviewed. Sinus rhythm borderline sinus tachycardia PR interval 0.12 QRS axis 30 patient has some potential right and conduction delay but no ST-T wave changes across precordium. It is reported per cardiology read that there is RSR prime  Assessment/Plan Active Problems:  Rheumatoid arthritis  Asthma  Systemic viral illness  Morbid obesity with BMI of 40.0-44.9, adult  Tachycardia  Sinusitis, acute frontal   1. 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 Patient will receive Solu-Medrol IV with 0.25 mg every 6 hourly, she received every 2-4 hourly albuterol nebulizations and we will review her in the morning to consider the escalation of care. She'll also receive tiotropium although there is less evidence for the same. 2. ? Chest pain-probably from asthma and heaviness in the chest, troponin was negative d-dimer was negative TIMI score is 0. Would not workup further at this time 3. ? Systemic viral illness-we will get a respiratory viral panel as well as a flu PCR given this flu season and reevaluate her with these findings. I would hold on antibiotics at present for the flu. Her symptoms are not completely consistent with the same given she also has diarrhea. She says that her diarrhea is green and I will get a C. difficile PCR as well although I  think the yield will be somewhat lower 4. Potential acute frontal sinusitis-we will start PT Snyder inhalations and nasally to help with the pain she does have some swelling around the right I ordered 5. ? Rheumatoid arthritis-she states her primary care physician has her on diazepam and opioids for this. I find this curious and will get a CRP and leave it up to the discretion of rounding physician to further work this up. 6. Morbid obesity BMI above 40-she'll need outpatient weight one occurring 7. Tachycardia-probably iatrogenic from albuterol inhalation. 8. Prior GI concerns with chronic abdominal issues-outpatient followup    Code Status: Full  Family Communication: Son is at bedside Disposition Plan: At least 2 midnights, Tele  Time spent: 55 minutes  Mahala Menghini [facility] Pager [phone]  If 7PM-7AM, please contact night-coverage [email or web address] Password [email or web address] 11/13/2012, 9:59 PM

## 2012-11-13 NOTE — ED Notes (Signed)
Pt c/o lower abd pain & NVD since yesterday.

## 2012-11-14 ENCOUNTER — Encounter (HOSPITAL_COMMUNITY): Payer: Self-pay

## 2012-11-14 ENCOUNTER — Inpatient Hospital Stay (HOSPITAL_COMMUNITY): Payer: Medicaid Other

## 2012-11-14 DIAGNOSIS — J0110 Acute frontal sinusitis, unspecified: Secondary | ICD-10-CM

## 2012-11-14 DIAGNOSIS — Z6841 Body Mass Index (BMI) 40.0 and over, adult: Secondary | ICD-10-CM

## 2012-11-14 DIAGNOSIS — R000 Tachycardia, unspecified: Secondary | ICD-10-CM

## 2012-11-14 DIAGNOSIS — B9789 Other viral agents as the cause of diseases classified elsewhere: Secondary | ICD-10-CM

## 2012-11-14 LAB — RESPIRATORY VIRUS PANEL
Influenza A H3: NOT DETECTED
Influenza A: NOT DETECTED
Influenza B: NOT DETECTED
Parainfluenza 1: NOT DETECTED
Respiratory Syncytial Virus A: NOT DETECTED
Respiratory Syncytial Virus B: NOT DETECTED
Rhinovirus: DETECTED — AB

## 2012-11-14 LAB — TROPONIN I: Troponin I: <0.3 ng/mL (ref <0.30)

## 2012-11-14 MED ORDER — FLUTICASONE-SALMETEROL 250-50 MCG/DOSE IN AEPB: 1.0000 | INHALATION_SPRAY | Freq: Two times a day (BID) | RESPIRATORY_TRACT | Status: DC

## 2012-11-14 MED ORDER — AZITHROMYCIN 500 MG PO TABS: 500.0000 mg | ORAL_TABLET | Freq: Every day | ORAL | Status: DC

## 2012-11-14 MED ORDER — IOHEXOL 350 MG/ML SOLN
100.0000 mL | Freq: Once | INTRAVENOUS | Status: AC | PRN
  Administered 2012-11-14: 100 mL via INTRAVENOUS

## 2012-11-14 MED ORDER — HYDROCODONE-ACETAMINOPHEN 10-325 MG PO TABS: 2.0000 | ORAL_TABLET | Freq: Four times a day (QID) | ORAL | Status: DC | PRN

## 2012-11-14 MED ORDER — AZITHROMYCIN 500 MG IV SOLR
500.0000 mg | INTRAVENOUS | Status: DC
  Administered 2012-11-14: 500 mg via INTRAVENOUS
  Filled 2012-11-14: qty 500

## 2012-11-14 MED ORDER — PREDNISONE 10 MG PO TABS: ORAL_TABLET | ORAL | Status: DC

## 2012-11-14 MED ORDER — HYDROCODONE-ACETAMINOPHEN 10-325 MG PO TABS
2.0000 | ORAL_TABLET | ORAL | Status: DC | PRN
  Administered 2012-11-14: 2 via ORAL
  Filled 2012-11-14: qty 2

## 2012-11-14 MED ORDER — PNEUMOCOCCAL VAC POLYVALENT 25 MCG/0.5ML IJ INJ
0.5000 mL | INJECTION | Freq: Once | INTRAMUSCULAR | Status: AC
  Administered 2012-11-14: 0.5 mL via INTRAMUSCULAR
  Filled 2012-11-14: qty 0.5

## 2012-11-14 MED ORDER — OXYCODONE HCL 5 MG PO TABS
20.0000 mg | ORAL_TABLET | Freq: Once | ORAL | Status: AC
  Administered 2012-11-14: 15 mg via ORAL
  Filled 2012-11-14: qty 4

## 2012-11-14 MED ORDER — HYDROMORPHONE HCL PF 1 MG/ML IJ SOLN
1.0000 mg | INTRAMUSCULAR | Status: DC | PRN
  Administered 2012-11-14: 1 mg via INTRAVENOUS
  Filled 2012-11-14: qty 1

## 2012-11-14 MED ORDER — PREDNISONE 50 MG PO TABS
50.0000 mg | ORAL_TABLET | Freq: Every day | ORAL | Status: DC
  Filled 2012-11-14: qty 1

## 2012-11-14 MED ORDER — INFLUENZA VIRUS VACC SPLIT PF IM SUSP
0.5000 mL | Freq: Once | INTRAMUSCULAR | Status: AC
  Administered 2012-11-14: 0.5 mL via INTRAMUSCULAR
  Filled 2012-11-14 (×2): qty 0.5

## 2012-11-14 MED ORDER — OXYCODONE HCL 5 MG PO TABS
5.0000 mg | ORAL_TABLET | Freq: Once | ORAL | Status: AC
  Administered 2012-11-14: 5 mg via ORAL
  Filled 2012-11-14: qty 1

## 2012-11-14 MED ORDER — HYDROCODONE-ACETAMINOPHEN 10-500 MG PO TABS: 1.0000 | ORAL_TABLET | Freq: Four times a day (QID) | ORAL | Status: DC | PRN

## 2012-11-14 MED ORDER — METHYLPREDNISOLONE SODIUM SUCC 125 MG IJ SOLR
125.0000 mg | Freq: Once | INTRAMUSCULAR | Status: AC
  Administered 2012-11-14: 125 mg via INTRAVENOUS
  Filled 2012-11-14: qty 2

## 2012-11-14 MED ORDER — PNEUMOCOCCAL VAC POLYVALENT 25 MCG/0.5ML IJ INJ: 0.5000 mL | INJECTION | INTRAMUSCULAR | Status: DC

## 2012-11-14 NOTE — ED Notes (Signed)
Pt was held off from being transferred to the floor at 0030 due to new CT order.

## 2012-11-14 NOTE — ED Notes (Signed)
MD at bedside. 

## 2012-11-14 NOTE — Care Management Note (Addendum)
    Page 1 of 1   11/14/2012     1:05:46 PM   CARE MANAGEMENT NOTE 11/14/2012  Patient:  [name], [name]   Account Number:  [id number]  Date Initiated:  11/14/2012  Documentation initiated by:  Lanier Clam  Subjective/Objective Assessment:   ADMITTED W/SOB.EA:[id number]     Action/Plan:   FROM HOME.HAS PCP.HAS PHARMACY.   Anticipated DC Date:  11/14/2012   Anticipated DC Plan:  HOME/SELF CARE  In-house referral  Financial Counselor      DC Planning Services  Medication Assistance      Choice offered to / List presented to:             Status of service:  Completed, signed off Medicare Important Message given?   (If response is "NO", the following Medicare IM given date fields will be blank) Date Medicare IM given:   Date Additional Medicare IM given:    Discharge Disposition:  HOME/SELF CARE  Per UR Regulation:  Reviewed for med. necessity/level of care/duration of stay  If discussed at Long Length of Stay Meetings, dates discussed:    Comments:  11/14/12 Lawan Nanez RN,BSN NCM [phone] QUALIFIES FOR INDIGENT FUNDS.INFORMED OF PHARMACY POLICY-3DY SUPPLY/NO NARCOTICS/1X USE W/IN 60YR.PROVIDED W/COMMUNITY [employer]OURCES-,PCP,DSS.

## 2012-11-14 NOTE — Progress Notes (Signed)
[facility] follow-[facility] progress note (same day)  Patient admitted earlier tonight  by myself with acute asthma exacerbation chest x-ray was negative, except for hyperinflation Wells criteria objectively is 4.5 although her d-dimer is 0.34 I feel it is prudent to rule out pulmonary embolism given she is still having chest pain and her wheezing is better but she still states that the pain is just not relieved despite receiving Dilaudid.   Pleas Koch, MD [facility] [phone]

## 2012-11-14 NOTE — Progress Notes (Signed)
O2 Sat post ambulation 91% on room air.

## 2012-11-14 NOTE — ED Notes (Signed)
Pt back from CT

## 2012-11-14 NOTE — Discharge Summary (Addendum)
Physician Discharge Summary  [name] JYN:[id number] DOB: 12/31/1975 DOA: 11/13/2012  PCP: Erlinda Hong, MD  Admit date: 11/13/2012 Discharge date: 11/14/2012  Time spent: 30  minutes  Recommendations for Outpatient Follow-up:  PCP in  4 weeks. Discharge Diagnoses:  Principal Problem:  *Asthma Active Problems:  Rheumatoid arthritis  Systemic viral illness  Morbid obesity with BMI of 40.0-44.9, adult  Tachycardia  Sinusitis, acute frontal   Discharge Condition: stable  Diet recommendation: regular  Filed Weights   11/14/12 0222  Weight: 118.2 kg (260 lb 9.3 oz)    History of present illness:  36 y.o. female came to [facility] ed 11/13/2012 with a week long h/o feeling ill. She started to have a runny nose and cough. She took OTc meds like mucinex and alka seltzer cold She [facility] and got a cold and flu remedy.  Last night she started to feel [facility] and started to thorw up and deeloped CP's-she coulnd;t sleep the whole of the evening of 11/30 and she couldn;t breath-she felt some PAin behiond her R eyeb with swelling of her eye.  She trialed her asthma pump only 11/30-Wheezing developed last night and she took her inhaler-she was taking 1200 mg of mucinex and the cold and flu pills. N/V + sputum which is yellow  Started to have nausea and vomiting yesterday which was not post-tussive . + diarrhoea as well, which she states was dark green, and the vomit became bilous-no food relationship to the vomiting or the diarrhea. She does have chronic abdominal pain and issues that have extensively been worked up in the past by Dr. Loreta Ave is controlled he  She also developed some CP as well she describes as being like a chest heaviness in the center of the chest all across the chest with a bandlike distribution. She states that nothing seems to relieve the pain and she carries a diagnosis of? Rheumatoid arthritis. She states that the Dilaudid given in the emergency room does not  help this pain and she is requesting something for this.   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  - Patient will received Solu-Medrol IV with 0.25 mg every 6 hourly, then changed to oral ,  continue tapared at home. - INhaleres albuterol, added advair for daily use. Ambulated with no desaturations.  Chest pain - Probably from asthma and heaviness in the chest, troponin was negative d-dimer was negative TIMI score is 0. - D-dimer negative. EKG no signs of pericarditis, CT Chest no PNA, no PE, LFT's with in normal limits. Mild tenderness to palpation of chest. Narcotics should help. - Would not workup further at this time  Rheumatoid arthritis - narcotics for pain controlled. Follow up with PCP  Systemic viral illness - viral panel pending, no fevers or chills, tylenol for symptoms management. - No Nausea or vomiting  Morbid obesity with BMI of 40.0-44.9, adult -counseling.  Tachycardia - resolved, 2/2 to asthma exacerbation.  Sinusitis, acute frontal - azithormycin daily. Headache improved, steroids will help with secretions.  Procedures:  none (i.e. Studies not automatically included, echos, thoracentesis, etc; not x-rays)  Consultations:  none  Discharge Exam: Filed Vitals:   11/13/12 2343 11/14/12 0222 11/14/12 0600 11/14/12 0737  BP: 107/55 120/67 111/60   Pulse:  110 95   Temp:  97.4 F (36.3 C) 98.3 F (36.8 C)   TempSrc:  Oral Oral   Resp:  20 18 20    Height:  5\' 5"  (1.651 m)    Weight:  118.2 kg (260 lb 9.3 oz)    SpO2:  98% 95% 94%    General: A&O x3  Cardiovascular: RRR Respiratory: good air movement, no wheezing B/L. Mild tenderness of chest palpation.  Discharge Instructions      Discharge Orders    Future Orders Please Complete By Expires   Diet - low sodium heart healthy      Increase activity slowly            Medication List     As of 11/14/2012  1:03 PM    TAKE these medications         albuterol 108 (90 BASE) MCG/ACT inhaler   Commonly known as: PROVENTIL HFA;VENTOLIN HFA   Inhale 2 puffs into the lungs every 6 (six) hours as needed. For shortness of breath      ALKA-SELTZER PLUS COLD & COUGH 04-14-09-325 MG Caps   Generic drug: Phenyleph-CPM-DM-APAP   Take 2 tablets by mouth every 6 (six) hours as needed. For cough cold symptoms      azithromycin 500 MG tablet   Commonly known as: ZITHROMAX   Take 1 tablet (500 mg total) by mouth daily.      diazepam 10 MG tablet   Commonly known as: VALIUM   Take 10 mg by mouth 2 (two) times daily as needed. For arthritis      dicyclomine 20 MG tablet   Commonly known as: BENTYL   Take 1 tablet (20 mg total) by mouth 2 (two) times daily.      Fluticasone-Salmeterol 250-50 MCG/DOSE Aepb   Commonly known as: ADVAIR   Inhale 1 puff into the lungs 2 (two) times daily.      guaiFENesin 600 MG 12 hr tablet   Commonly known as: MUCINEX   Take 600 mg by mouth 2 (two) times daily.      HYDROcodone-acetaminophen 10-500 MG per tablet   Commonly known as: LORTAB   Take 1 tablet by mouth every 6 (six) hours as needed. For pain      predniSONE 10 MG tablet   Commonly known as: DELTASONE   Takes 6 tablets for 1 days, then 5 tablets for 1 days, then 4 tablets for 1 days, then 3 tablets for 1 days, then 2 tabs for 1 days, then 1 tab for 1 days, and then stop.         Follow-up Information    Follow up with [facility], MD. In 3 weeks. (hospital follow up)    Contact information:   [address] [place] [address] [phone]           The results of significant diagnostics from this hospitalization (including imaging, microbiology, ancillary and laboratory) are listed below for reference.    Significant Diagnostic Studies: Dg Chest 2 View (if Patient Has Fever And/or Copd)  11/13/2012  *RADIOLOGY REPORT*  Clinical Data:  Chest pain, shortness of breath, cough, and vomiting.  CHEST - 2 VIEW  Comparison: 10/09/2009  Findings: Heart size and vascularity are normal and the lungs are clear.  No osseous abnormality.  IMPRESSION: Normal chest.   Original Report Authenticated By: Francene Boyers, M.D.    Ct Angio Chest Pe W/cm &/or Wo Cm  11/14/2012  *RADIOLOGY REPORT*  Clinical Data: Shortness of breath and chest pain; nausea and vomiting.  History of asthma.  CT ANGIOGRAPHY CHEST  Technique:  Multidetector CT imaging of  the chest using the standard protocol during bolus administration of intravenous contrast. Multiplanar reconstructed images including MIPs were obtained and reviewed to evaluate the vascular anatomy.  Contrast:  100 mL of Omnipaque 350 IV contrast  Comparison: Chest radiograph performed 11/13/2012, and CTA of the chest performed 08/25/2008  Findings: There is no evidence of significant pulmonary embolus.  Mild right basilar atelectasis is noted.  The lungs are otherwise clear.  There is no evidence of significant focal consolidation, pleural effusion or pneumothorax.  No masses are identified; no abnormal focal contrast enhancement is seen.  The mediastinum is unremarkable in appearance.  No mediastinal lymphadenopathy is seen.  No pericardial effusion is identified. The great vessels are grossly unremarkable in appearance.  No axillary lymphadenopathy is seen; a 1.4 cm right axillary node remains normal in size.  The visualized portions of the thyroid gland are unremarkable in appearance.  The visualized portions of the liver and spleen are unremarkable.  No acute osseous abnormalities are seen.  IMPRESSION: No evidence of significant pulmonary embolus.  Mild right basilar atelectasis noted; lungs otherwise clear.   Original Report Authenticated By: Tonia Ghent, M.D.     Microbiology: No results found for this or any previous visit (from the past 240 hour(s)).   Labs: Basic Metabolic Panel:  Lab 11/13/12 9562    NA 136  K 4.1  CL 100  CO2 26  GLUCOSE 90  BUN 6  CREATININE 0.69  CALCIUM 9.7  MG --  PHOS --   Liver Function Tests:  Lab 11/13/12 1800  AST 20  ALT 12  ALKPHOS 110  BILITOT 0.5  PROT 8.1  ALBUMIN 3.6    Lab 11/13/12 1800  LIPASE 17  AMYLASE --   No results found for this basename: AMMONIA:5 in the last 168 hours CBC:  Lab 11/13/12 1800  WBC 6.8  NEUTROABS --  HGB 13.2  HCT 39.6  MCV 87.6  PLT 321   Cardiac Enzymes: No results found for this basename: CKTOTAL:5,CKMB:5,CKMBINDEX:5,TROPONINI:5 in the last 168 hours BNP: BNP (last 3 results) No results found for this basename: PROBNP:3 in the last 8760 hours CBG: No results found for this basename: GLUCAP:5 in the last 168 hours     Signed:  Marinda Elk  [facility] 11/14/2012, 1:03 PM

## 2012-11-14 NOTE — Progress Notes (Signed)
Pt complaining of 10/10 "pressure" in mid-chest. Denies radiation, jaw/neck/back pain. NSR on monitor, troponin 0.0 in ED. Admitted with this same pain. No relief with pain meds or albuterol nebs. MD aware. Pt able to hold conversation without any respiratory efforts. VSS. Appears in no distress. Exp wheezing decreasing and more air movement heard throughout lungs over night.

## 2012-11-14 NOTE — Discharge Summary (Deleted)
Physician Discharge Summary  CHARLYN VIALPANDO UJW:[id number] DOB: 1976/09/16 DOA: 11/13/2012  PCP: Erlinda Hong, MD  Admit date: 11/13/2012 Discharge date: 11/14/2012  Time spent: 30  minutes  Recommendations for Outpatient Follow-up:  PCP in  4 weeks. Discharge Diagnoses:  Principal Problem:  *Asthma Active Problems:  Rheumatoid arthritis  Systemic viral illness  Morbid obesity with BMI of 40.0-44.9, adult  Tachycardia  Sinusitis, acute frontal   Discharge Condition: stable  Diet recommendation: regular  Filed Weights   11/14/12 0222  Weight: 118.2 kg (260 lb 9.3 oz)    History of present illness:  36 y.o. female came to [facility] ed 11/13/2012 with a week long h/o feeling ill. She started to have a runny nose and cough. She took OTc meds like mucinex and alka seltzer cold She wen [employer]to the [employer] and got a cold and flu remedy.  Last night she started to feel [facility] and started to thorw up and deeloped CP's-she coulnd;t sleep the whole of the evening of 11/30 and she couldn;t breath-she felt some PAin behiond her R eyeb with swelling of her eye.  She trialed her asthma pump only 11/30-Wheezing developed last night and she took her inhaler-she was taking 1200 mg of mucinex and the cold and flu pills. N/V + sputum which is yellow  Started to have nausea and vomiting yesterday which was not post-tussive . + diarrhoea as well, which she states was dark green, and the vomit became bilous-no food relationship to the vomiting or the diarrhea. She does have chronic abdominal pain and issues that have extensively been worked up in the past by Dr. Loreta Ave is controlled he  She also developed some CP as well she describes as being like a chest heaviness in the center of the chest all across the chest with a bandlike distribution. She states that nothing seems to relieve the pain and she carries a diagnosis of? Rheumatoid arthritis. She states that the Dilaudid given in the emergency room does not  help this pain and she is requesting something for this.   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  -Patient will receive Solu-Medrol IV with 0.25 mg every 6 hourly, then changed to oral will will continue tapared at home. - INhaleres albuterol added advair for daily use.  Rheumatoid arthritis - narcotics for pain controlled. Follow up with PCP  Systemic viral illness - viral panel pending, no fevers or chills, tylenol for symptoms management.  Morbid obesity with BMI of 40.0-44.9, adult -counseling.  Tachycardia - resolved, 2/2 to asthma exacerbation.  Sinusitis, acute frontal - azithormycin daily. Headache improved. With steroids.  Procedures:  none (i.e. Studies not automatically included, echos, thoracentesis, etc; not x-rays)  Consultations:  none  Discharge Exam: Filed Vitals:   11/13/12 2343 11/14/12 0222 11/14/12 0600 11/14/12 0737  BP: 107/55 120/67 111/60   Pulse:  110 95   Temp:  97.4 F (36.3 C) 98.3 F (36.8 C)   TempSrc:  Oral Oral   Resp: 20 18 20    Height:  5\' 5"  (1.651 m)    Weight:  118.2 kg (260 lb 9.3 oz)    SpO2:  98% 95% 94%    General: A&O x3  Cardiovascular: RRR Respiratory: good air movement, no wheezing B/L.  Discharge Instructions      Discharge Orders    Future Orders Please Complete By Expires   Diet - low sodium  heart healthy      Increase activity slowly          Medication List     As of 11/14/2012  8:52 AM    TAKE these medications         albuterol 108 (90 BASE) MCG/ACT inhaler   Commonly known as: PROVENTIL HFA;VENTOLIN HFA   Inhale 2 puffs into the lungs every 6 (six) hours as needed. For shortness of breath      ALKA-SELTZER PLUS COLD & COUGH 04-14-09-325 MG Caps   Generic drug: Phenyleph-CPM-DM-APAP   Take 2 tablets by mouth every 6 (six) hours as needed. For cough cold symptoms       azithromycin 500 MG tablet   Commonly known as: ZITHROMAX   Take 1 tablet (500 mg total) by mouth daily.      diazepam 10 MG tablet   Commonly known as: VALIUM   Take 10 mg by mouth 2 (two) times daily as needed. For arthritis      dicyclomine 20 MG tablet   Commonly known as: BENTYL   Take 1 tablet (20 mg total) by mouth 2 (two) times daily.      Fluticasone-Salmeterol 250-50 MCG/DOSE Aepb   Commonly known as: ADVAIR   Inhale 1 puff into the lungs 2 (two) times daily.      guaiFENesin 600 MG 12 hr tablet   Commonly known as: MUCINEX   Take 600 mg by mouth 2 (two) times daily.      HYDROcodone-acetaminophen 10-500 MG per tablet   Commonly known as: LORTAB   Take 1 tablet by mouth every 6 (six) hours as needed. For pain      predniSONE 10 MG tablet   Commonly known as: DELTASONE   Takes 6 tablets for 1 days, then 5 tablets for 1 days, then 4 tablets for 1 days, then 3 tablets for 1 days, then 2 tabs for 1 days, then 1 tab for 1 days, and then stop.        Follow-up Information    Follow up with [facility], [place]. In 3 weeks. (hospital follow up)    Contact information:   [address] [place] [address] [phone]           The results of significant diagnostics from this hospitalization (including imaging, microbiology, ancillary and laboratory) are listed below for reference.    Significant Diagnostic Studies: Dg Chest 2 View (if Patient Has Fever And/or Copd)  11/13/2012  *RADIOLOGY REPORT*  Clinical Data: Chest pain, shortness of breath, cough, and vomiting.  CHEST - 2 VIEW  Comparison: 10/09/2009  Findings: Heart size and vascularity are normal and the lungs are clear.  No osseous abnormality.  IMPRESSION: Normal chest.   Original Report Authenticated By: Francene Boyers, M.D.    Ct Angio Chest Pe W/cm &/or Wo Cm  11/14/2012  *RADIOLOGY REPORT*  Clinical Data: Shortness of breath and chest pain; nausea and vomiting.  History of asthma.  CT  ANGIOGRAPHY CHEST  Technique:  Multidetector CT imaging of the chest using the standard protocol during bolus administration of intravenous contrast. Multiplanar reconstructed images including MIPs were obtained and reviewed to evaluate the vascular anatomy.  Contrast:  100 mL of Omnipaque 350 IV contrast  Comparison: Chest radiograph performed 11/13/2012, and CTA of the chest performed 08/25/2008  Findings: There is no evidence of significant pulmonary embolus.  Mild right basilar atelectasis is noted.  The lungs are otherwise clear.  There is no evidence of  significant focal consolidation, pleural effusion or pneumothorax.  No masses are identified; no abnormal focal contrast enhancement is seen.  The mediastinum is unremarkable in appearance.  No mediastinal lymphadenopathy is seen.  No pericardial effusion is identified. The great vessels are grossly unremarkable in appearance.  No axillary lymphadenopathy is seen; a 1.4 cm right axillary node remains normal in size.  The visualized portions of the thyroid gland are unremarkable in appearance.  The visualized portions of the liver and spleen are unremarkable.  No acute osseous abnormalities are seen.  IMPRESSION: No evidence of significant pulmonary embolus.  Mild right basilar atelectasis noted; lungs otherwise clear.   Original Report Authenticated By: Tonia Ghent, M.D.     Microbiology: No results found for this or any previous visit (from the past 240 hour(s)).   Labs: Basic Metabolic Panel:  Lab 11/13/12 1191  NA 136  K 4.1  CL 100  CO2 26  GLUCOSE 90  BUN 6  CREATININE 0.69  CALCIUM 9.7  MG --  PHOS --   Liver Function Tests:  Lab 11/13/12 1800  AST 20  ALT 12  ALKPHOS 110  BILITOT 0.5  PROT 8.1  ALBUMIN 3.6    Lab 11/13/12 1800  LIPASE 17  AMYLASE --   No results found for this basename: AMMONIA:5 in the last 168 hours CBC:  Lab 11/13/12 1800  WBC 6.8  NEUTROABS --  HGB 13.2  HCT 39.6  MCV 87.6  PLT 321    Cardiac Enzymes: No results found for this basename: CKTOTAL:5,CKMB:5,CKMBINDEX:5,TROPONINI:5 in the last 168 hours BNP: BNP (last 3 results) No results found for this basename: PROBNP:3 in the last 8760 hours CBG: No results found for this basename: GLUCAP:5 in the last 168 hours     Signed:  Marinda Elk  [facility] 11/14/2012, 8:52 AM

## 2012-11-15 ENCOUNTER — Encounter (HOSPITAL_COMMUNITY): Payer: Self-pay | Admitting: *Deleted

## 2012-11-15 ENCOUNTER — Emergency Department (HOSPITAL_COMMUNITY)
Admission: EM | Admit: 2012-11-15 | Discharge: 2012-11-15 | Disposition: A | Discharge status: Home / Self Care | Payer: Medicaid Other | Attending: Emergency Medicine | Admitting: Emergency Medicine

## 2012-11-15 DIAGNOSIS — IMO0002 Reserved for concepts with insufficient information to code with codable children: Secondary | ICD-10-CM | POA: Insufficient documentation

## 2012-11-15 DIAGNOSIS — Z79899 Other long term (current) drug therapy: Secondary | ICD-10-CM | POA: Insufficient documentation

## 2012-11-15 DIAGNOSIS — Z8739 Personal history of other diseases of the musculoskeletal system and connective tissue: Secondary | ICD-10-CM | POA: Insufficient documentation

## 2012-11-15 DIAGNOSIS — J45909 Unspecified asthma, uncomplicated: Secondary | ICD-10-CM

## 2012-11-15 DIAGNOSIS — B349 Viral infection, unspecified: Secondary | ICD-10-CM

## 2012-11-15 DIAGNOSIS — R072 Precordial pain: Secondary | ICD-10-CM | POA: Insufficient documentation

## 2012-11-15 DIAGNOSIS — J45901 Unspecified asthma with (acute) exacerbation: Secondary | ICD-10-CM | POA: Insufficient documentation

## 2012-11-15 DIAGNOSIS — B9789 Other viral agents as the cause of diseases classified elsewhere: Secondary | ICD-10-CM | POA: Insufficient documentation

## 2012-11-15 MED ORDER — DEXAMETHASONE SODIUM PHOSPHATE 10 MG/ML IJ SOLN
10.0000 mg | Freq: Once | INTRAMUSCULAR | Status: AC
  Administered 2012-11-15: 10 mg via INTRAMUSCULAR
  Filled 2012-11-15: qty 1

## 2012-11-15 NOTE — ED Notes (Signed)
Pt admitted yesterday and discharged today for shortness of breath and chest pain; states is no better; pt upset with terms of discharge stating she should not have been discharged; speaking in full sentences; sats 98% on room air;

## 2012-11-15 NOTE — ED Notes (Signed)
While ambulating pt's 02 stayed between 98 and 100%

## 2012-11-15 NOTE — ED Provider Notes (Signed)
History     CSN: [id number]  Arrival date & time 11/15/12  0011   First MD Initiated Contact with Patient 11/15/12 0146      Chief Complaint  Patient presents with  . Shortness of Breath  . Chest Pain    (Consider location/radiation/quality/duration/timing/severity/associated sxs/prior treatment) The history is provided by the patient.    36yo F who was admitted 12/1 and disharged from the hospital 12/2 after being treated for an asthma exacerbation secondary to viral infection. She presents today with continued mid sternal chest heaviness that has not improved since her hospital admission and persistent end inspiratory wheezes. CTA, d-dimer, and iSTAT Troponin negative; respiratory virus panel was positive for Rhinovirus. She was prescribed Azithromycin, Advair, and Prednisone at discharge, but has not used any of these medications; she did not have the prednisone prescription filled. She states that she should not have been discharged home so quickly.   Her diarrhea, nausea, and vomiting present during her brief hospital have all resolved.    Past Medical History  Diagnosis Date  . Asthma     diagnosed as  a young adult   . Rheumatoid arthritis     Past Surgical History  Procedure Date  . Hernia repair   . Abdominal hysterectomy   . Cystectomy   . Cholecystectomy   . Tubal ligation   . Breast reduction surgery   . Appendectomy     Family History  Problem Relation Age of Onset  . Breast cancer Father     died  . Lupus Maternal Aunt   . Rheum arthritis Mother   . Rheum arthritis Father   . Rheum arthritis Maternal Grandmother   . Rheum arthritis Maternal Grandfather   . Rheum arthritis Paternal Grandfather   . Rheum arthritis Paternal Grandfather   . Rheum arthritis Paternal Grandmother     History  Substance Use Topics  . Smoking status: Never Smoker   . Smokeless tobacco: Never Used  . Alcohol Use: No    OB History    Grav Para Term Preterm Abortions  TAB SAB Ect Mult Living                  Review of Systems  All other systems reviewed and are negative.    Allergies  Bee venom; Diclofenac; Metoclopramide; and Tramadol  Home Medications   Current Outpatient Rx  Name  Route  Sig  Dispense  Refill  . ALBUTEROL SULFATE HFA 108 (90 BASE) MCG/ACT IN AERS   Inhalation   Inhale 2 puffs into the lungs every 6 (six) hours as needed. For shortness of breath         . AZITHROMYCIN 500 MG PO TABS   Oral   Take 1 tablet (500 mg total) by mouth daily.   5 tablet   0   . DIAZEPAM 10 MG PO TABS   Oral   Take 10 mg by mouth 2 (two) times daily as needed. For arthritis         . DICYCLOMINE HCL 20 MG PO TABS   Oral   Take 1 tablet (20 mg total) by mouth 2 (two) times daily.   20 tablet   0   . FLUTICASONE-SALMETEROL 250-50 MCG/DOSE IN AEPB   Inhalation   Inhale 1 puff into the lungs 2 (two) times daily.   60 each   1   . GUAIFENESIN ER 600 MG PO TB12   Oral   Take 600 mg by mouth 2 (  two) times daily.         Marland Kitchen HYDROCODONE-ACETAMINOPHEN 10-500 MG PO TABS   Oral   Take 1 tablet by mouth every 6 (six) hours as needed. For pain   10 tablet   0   . PHENYLEPH-CPM-DM-APAP 04-14-09-325 MG PO CAPS   Oral   Take 2 tablets by mouth every 6 (six) hours as needed. For cough cold symptoms         . PREDNISONE 10 MG PO TABS      Takes 6 tablets for 1 days, then 5 tablets for 1 days, then 4 tablets for 1 days, then 3 tablets for 1 days, then 2 tabs for 1 days, then 1 tab for 1 days, and then stop.   21 tablet   0     BP 139/83  Pulse 76  Temp 97.7 F (36.5 C)  Resp 16  SpO2 98%  Physical Exam  Constitutional: She is oriented to person, place, and time.  HENT:  Head: Normocephalic and atraumatic.  Nose: Rhinorrhea present.  Mouth/Throat: No oropharyngeal exudate, posterior oropharyngeal edema or posterior oropharyngeal erythema.  Eyes: Conjunctivae normal are normal. Pupils are equal, round, and reactive to light.   Neck: Neck supple. No thyromegaly present.  Cardiovascular: Normal rate and regular rhythm.   Pulmonary/Chest: Effort normal. No respiratory distress. She has wheezes. She exhibits no tenderness.  Abdominal: Soft. Bowel sounds are normal.  Musculoskeletal: She exhibits no edema and no tenderness.  Neurological: She is alert and oriented to person, place, and time. No cranial nerve deficit.  Skin: Skin is warm.  Psychiatric: She has a normal mood and affect.    ED Course  Procedures (including critical care time)  Labs Reviewed - No data to display Dg Chest 2 View (if Patient Has Fever And/or Copd)  11/13/2012  *RADIOLOGY REPORT*  Clinical Data: Chest pain, shortness of breath, cough, and vomiting.  CHEST - 2 VIEW  Comparison: 10/09/2009  Findings: Heart size and vascularity are normal and the lungs are clear.  No osseous abnormality.  IMPRESSION: Normal chest.   Original Report Authenticated By: Francene Boyers, M.D.    Ct Angio Chest Pe W/cm &/or Wo Cm  11/14/2012  *RADIOLOGY REPORT*  Clinical Data: Shortness of breath and chest pain; nausea and vomiting.  History of asthma.  CT ANGIOGRAPHY CHEST  Technique:  Multidetector CT imaging of the chest using the standard protocol during bolus administration of intravenous contrast. Multiplanar reconstructed images including MIPs were obtained and reviewed to evaluate the vascular anatomy.  Contrast:  100 mL of Omnipaque 350 IV contrast  Comparison: Chest radiograph performed 11/13/2012, and CTA of the chest performed 08/25/2008  Findings: There is no evidence of significant pulmonary embolus.  Mild right basilar atelectasis is noted.  The lungs are otherwise clear.  There is no evidence of significant focal consolidation, pleural effusion or pneumothorax.  No masses are identified; no abnormal focal contrast enhancement is seen.  The mediastinum is unremarkable in appearance.  No mediastinal lymphadenopathy is seen.  No pericardial effusion is  identified. The great vessels are grossly unremarkable in appearance.  No axillary lymphadenopathy is seen; a 1.4 cm right axillary node remains normal in size.  The visualized portions of the thyroid gland are unremarkable in appearance.  The visualized portions of the liver and spleen are unremarkable.  No acute osseous abnormalities are seen.  IMPRESSION: No evidence of significant pulmonary embolus.  Mild right basilar atelectasis noted; lungs otherwise clear.   Original Report  Authenticated By: Tonia Ghent, M.D.      No diagnosis found.    MDM  36yo F, who was admitted 12/1 and disharged from the hospital 12/2 after being treated for an asthma exacerbation secondary to viral infection, presents with continued mid sternal chest pressure and upper respiratory symptoms.  i-STAT troponin negative; EKG unchanged. Pt ambulating with oxygen saturation between 98-100% on room air.    Will discharge the patient to home. 10mg  IM Decadron was administered prior to discharge home. She is to continue the Azithromycin and Advair as prescribed, and if her respiratory symptoms do not improve, she should start the Prednisone after 3 days.          Genelle Gather, MD 11/15/12 2119

## 2012-11-15 NOTE — ED Notes (Signed)
Pt states "I was discharged this afternoon because I don't have insurance and I am not any better"; Pt states chest pain and breathing are no better than when she was admitted and reports that the "first admitting physician said I would be in the hospital for 2 days or more then the other physician discharged me because he said I was here for the wrong reason and just wanted pain medication and I didn't have insurance to pay so he told me I would have to leave."

## 2012-11-19 NOTE — ED Provider Notes (Signed)
I saw and evaluated the patient, reviewed the resident's note and I agree with the findings and plan.   .Face to face Exam:  General:  Awake HEENT:  Atraumatic Abd:  Nondistended Neuro:No focal weakness Lymph: No adenopathy   Nelia Shi, MD 11/19/12 1751

## 2013-03-04 ENCOUNTER — Emergency Department (HOSPITAL_COMMUNITY): Payer: Self-pay

## 2013-03-04 ENCOUNTER — Encounter (HOSPITAL_COMMUNITY): Payer: Self-pay | Admitting: *Deleted

## 2013-03-04 ENCOUNTER — Emergency Department (HOSPITAL_COMMUNITY)
Admission: EM | Admit: 2013-03-04 | Discharge: 2013-03-04 | Disposition: A | Discharge status: Home / Self Care | Payer: Self-pay | Attending: Emergency Medicine | Admitting: Emergency Medicine

## 2013-03-04 DIAGNOSIS — R51 Headache: Secondary | ICD-10-CM | POA: Insufficient documentation

## 2013-03-04 DIAGNOSIS — Z8739 Personal history of other diseases of the musculoskeletal system and connective tissue: Secondary | ICD-10-CM | POA: Insufficient documentation

## 2013-03-04 DIAGNOSIS — R509 Fever, unspecified: Secondary | ICD-10-CM | POA: Insufficient documentation

## 2013-03-04 DIAGNOSIS — R197 Diarrhea, unspecified: Secondary | ICD-10-CM | POA: Insufficient documentation

## 2013-03-04 DIAGNOSIS — K2970 Gastritis, unspecified, without bleeding: Secondary | ICD-10-CM | POA: Insufficient documentation

## 2013-03-04 DIAGNOSIS — J45909 Unspecified asthma, uncomplicated: Secondary | ICD-10-CM | POA: Insufficient documentation

## 2013-03-04 DIAGNOSIS — Z79899 Other long term (current) drug therapy: Secondary | ICD-10-CM | POA: Insufficient documentation

## 2013-03-04 DIAGNOSIS — R040 Epistaxis: Secondary | ICD-10-CM | POA: Insufficient documentation

## 2013-03-04 DIAGNOSIS — R1013 Epigastric pain: Secondary | ICD-10-CM | POA: Insufficient documentation

## 2013-03-04 LAB — CBC WITH DIFFERENTIAL/PLATELET
Basophils Absolute: 0 10*3/uL (ref 0.0–0.1)
Eosinophils Relative: 4 % (ref 0–5)
HCT: 38.1 % (ref 36.0–46.0)
Hemoglobin: 12.5 g/dL (ref 12.0–15.0)
Lymphocytes Relative: 36 % (ref 12–46)
Lymphs Abs: 1.4 10*3/uL (ref 0.7–4.0)
MCV: 89.6 fL (ref 78.0–100.0)
Monocytes Absolute: 0.2 10*3/uL (ref 0.1–1.0)
Neutro Abs: 2.1 10*3/uL (ref 1.7–7.7)
RBC: 4.25 MIL/uL (ref 3.87–5.11)
WBC: 3.9 10*3/uL — ABNORMAL LOW (ref 4.0–10.5)

## 2013-03-04 LAB — COMPREHENSIVE METABOLIC PANEL
ALT: 11 U/L (ref 0–35)
AST: 13 U/L (ref 0–37)
CO2: 26 mEq/L (ref 19–32)
Calcium: 9.2 mg/dL (ref 8.4–10.5)
Chloride: 101 mEq/L (ref 96–112)
Creatinine, Ser: 0.64 mg/dL (ref 0.50–1.10)
GFR calc Af Amer: >90 mL/min (ref >90)
GFR calc non Af Amer: >90 mL/min (ref >90)
Glucose, Bld: 84 mg/dL (ref 70–99)
Total Bilirubin: 0.5 mg/dL (ref 0.3–1.2)

## 2013-03-04 MED ORDER — PANTOPRAZOLE SODIUM 40 MG PO TBEC
40.0000 mg | DELAYED_RELEASE_TABLET | Freq: Once | ORAL | Status: AC
  Administered 2013-03-04: 40 mg via ORAL
  Filled 2013-03-04: qty 1

## 2013-03-04 MED ORDER — ONDANSETRON HCL 4 MG/2ML IJ SOLN
4.0000 mg | Freq: Once | INTRAMUSCULAR | Status: AC
  Administered 2013-03-04: 4 mg via INTRAVENOUS
  Filled 2013-03-04: qty 2

## 2013-03-04 MED ORDER — SODIUM CHLORIDE 0.9 % IV BOLUS (SEPSIS)
1000.0000 mL | Freq: Once | INTRAVENOUS | Status: AC
  Administered 2013-03-04: 1000 mL via INTRAVENOUS

## 2013-03-04 MED ORDER — PROMETHAZINE HCL 25 MG PO TABS: 25.0000 mg | ORAL_TABLET | Freq: Four times a day (QID) | ORAL | Status: DC | PRN

## 2013-03-04 MED ORDER — RANITIDINE HCL 150 MG PO CAPS: 150.0000 mg | ORAL_CAPSULE | Freq: Every day | ORAL | Status: DC

## 2013-03-04 NOTE — ED Notes (Signed)
Pt states she originally started with an abscess on left upper side tooth,  She has been on amoxicillin 3 times a day ,  She has difficulty talking,  Pain 10/10  Left nostril nose bleeds,  Abdominal pain and a headache,   Also having visual changes.

## 2013-03-04 NOTE — ED Notes (Signed)
Also states she has had fevers

## 2013-03-04 NOTE — ED Notes (Signed)
Patient transported to X-ray 

## 2013-03-04 NOTE — ED Provider Notes (Signed)
History     CSN: [id number]  Arrival date & time 03/04/13  1924   First MD Initiated Contact with Patient 03/04/13 2026      Chief Complaint  Patient presents with  . Nausea  . Emesis  . Headache  . Epistaxis  . Abdominal Pain  . Diarrhea  . Fever    (Consider location/radiation/quality/duration/timing/severity/associated sxs/prior treatment) Patient is a 37 y.o. female presenting with abdominal pain. The history is provided by the patient. No language interpreter was used.  Abdominal Pain Pain location:  Epigastric Pain quality: aching   Pain radiates to:  Does not radiate Pain severity:  Moderate Onset quality:  Gradual Timing:  Constant Progression:  Waxing and waning Chronicity:  New Associated symptoms: no chest pain, no cough, no diarrhea, no fatigue and no hematuria     Past Medical History  Diagnosis Date  . Asthma     diagnosed as  a young adult   . Rheumatoid arthritis     Past Surgical History  Procedure Laterality Date  . Hernia repair    . Abdominal hysterectomy    . Cystectomy    . Cholecystectomy    . Tubal ligation    . Breast reduction surgery    . Appendectomy      Family History  Problem Relation Age of Onset  . Breast cancer Father     died  . Lupus Maternal Aunt   . Rheum arthritis Mother   . Rheum arthritis Father   . Rheum arthritis Maternal Grandmother   . Rheum arthritis Maternal Grandfather   . Rheum arthritis Paternal Grandfather   . Rheum arthritis Paternal Grandfather   . Rheum arthritis Paternal Grandmother     History  Substance Use Topics  . Smoking status: Never Smoker   . Smokeless tobacco: Never Used  . Alcohol Use: No    OB History   Grav Para Term Preterm Abortions TAB SAB Ect Mult Living                  Review of Systems  Constitutional: Negative for fatigue.  HENT: Negative for congestion, sinus pressure and ear discharge.   Eyes: Negative for discharge.  Respiratory: Negative for cough.    Cardiovascular: Negative for chest pain.  Gastrointestinal: Positive for abdominal pain. Negative for diarrhea.  Genitourinary: Negative for frequency and hematuria.  Musculoskeletal: Negative for back pain.  Skin: Negative for rash.  Neurological: Negative for seizures and headaches.  Psychiatric/Behavioral: Negative for hallucinations.    Allergies  Bee venom; Diclofenac; Metoclopramide; and Tramadol  Home Medications   Current Outpatient Rx  Name  Route  Sig  Dispense  Refill  . albuterol (PROVENTIL HFA;VENTOLIN HFA) 108 (90 BASE) MCG/ACT inhaler   Inhalation   Inhale 2 puffs into the lungs every 6 (six) hours as needed. For shortness of breath         . diazepam (VALIUM) 10 MG tablet   Oral   Take 10 mg by mouth 2 (two) times daily as needed. For arthritis         . ibuprofen (ADVIL,MOTRIN) 200 MG tablet   Oral   Take 400 mg by mouth every 6 (six) hours as needed for pain.         . naproxen sodium (ANAPROX) 220 MG tablet   Oral   Take 880 mg by mouth 2 (two) times daily.         Marland Kitchen oxyCODONE-acetaminophen (PERCOCET/ROXICET) 5-325 MG per tablet   Oral  Take 1 tablet by mouth every 4 (four) hours as needed for pain.         . promethazine (PHENERGAN) 25 MG tablet   Oral   Take 1 tablet (25 mg total) by mouth every 6 (six) hours as needed for nausea.   15 tablet   0   . ranitidine (ZANTAC) 150 MG capsule   Oral   Take 1 capsule (150 mg total) by mouth daily.   60 capsule   0     BP 106/68  Pulse 64  Temp(Src) 98.6 F (37 C) (Oral)  Resp 20  Wt 260 lb (117.935 kg)  BMI 43.27 kg/m2  SpO2 100%  Physical Exam  Constitutional: She is oriented to person, place, and time. She appears well-developed.  HENT:  Head: Normocephalic and atraumatic.  Eyes: Conjunctivae and EOM are normal. No scleral icterus.  Neck: Neck supple. No thyromegaly present.  Cardiovascular: Normal rate and regular rhythm.  Exam reveals no gallop and no friction rub.   No  murmur heard. Pulmonary/Chest: No stridor. She has no wheezes. She has no rales. She exhibits no tenderness.  Abdominal: She exhibits no distension. There is tenderness. There is no rebound.  Tender epigastric  Musculoskeletal: Normal range of motion. She exhibits no edema.  Lymphadenopathy:    She has no cervical adenopathy.  Neurological: She is oriented to person, place, and time. Coordination normal.  Skin: No rash noted. No erythema.  Psychiatric: She has a normal mood and affect. Her behavior is normal.    ED Course  Procedures (including critical care time)  Labs Reviewed  CBC WITH DIFFERENTIAL - Abnormal; Notable for the following:    WBC 3.9 (*)    All other components within normal limits  COMPREHENSIVE METABOLIC PANEL - Abnormal; Notable for the following:    Albumin 3.4 (*)    All other components within normal limits   Dg Abd Acute W/chest  03/04/2013  *RADIOLOGY REPORT*  Clinical Data: Abdominal pain  ACUTE ABDOMEN SERIES (ABDOMEN 2 VIEW & CHEST 1 VIEW)  Comparison: 11/14/2012 chest CT, 09/12/2012 abdominal CT  Findings: Lungs are clear.  Cardiomediastinal contours within normal range.  No free intraperitoneal air.  Surgical clips right upper quadrant. The bowel gas pattern is non-obstructive. Organ outlines are normal where seen. No acute or aggressive osseous abnormality identified.  IMPRESSION: Nonobstructive bowel gas pattern.   Original Report Authenticated By: Jearld Lesch, M.D.      1. Gastritis       MDM  Pt to stop amox and ansaids.       Benny Lennert, MD 03/04/13 2250

## 2013-06-10 ENCOUNTER — Emergency Department (HOSPITAL_COMMUNITY): Payer: BC Managed Care – PPO

## 2013-06-10 ENCOUNTER — Encounter (HOSPITAL_COMMUNITY): Payer: Self-pay | Admitting: Family Medicine

## 2013-06-10 ENCOUNTER — Emergency Department (HOSPITAL_COMMUNITY)
Admission: EM | Admit: 2013-06-10 | Discharge: 2013-06-10 | Disposition: A | Discharge status: Home / Self Care | Payer: BC Managed Care – PPO | Attending: Emergency Medicine | Admitting: Emergency Medicine

## 2013-06-10 DIAGNOSIS — Y92009 Unspecified place in unspecified non-institutional (private) residence as the place of occurrence of the external cause: Secondary | ICD-10-CM | POA: Insufficient documentation

## 2013-06-10 DIAGNOSIS — J45909 Unspecified asthma, uncomplicated: Secondary | ICD-10-CM | POA: Insufficient documentation

## 2013-06-10 DIAGNOSIS — R209 Unspecified disturbances of skin sensation: Secondary | ICD-10-CM | POA: Insufficient documentation

## 2013-06-10 DIAGNOSIS — S0993XA Unspecified injury of face, initial encounter: Secondary | ICD-10-CM | POA: Insufficient documentation

## 2013-06-10 DIAGNOSIS — M069 Rheumatoid arthritis, unspecified: Secondary | ICD-10-CM | POA: Insufficient documentation

## 2013-06-10 DIAGNOSIS — S0990XA Unspecified injury of head, initial encounter: Secondary | ICD-10-CM | POA: Insufficient documentation

## 2013-06-10 DIAGNOSIS — Y9389 Activity, other specified: Secondary | ICD-10-CM | POA: Insufficient documentation

## 2013-06-10 DIAGNOSIS — IMO0002 Reserved for concepts with insufficient information to code with codable children: Secondary | ICD-10-CM | POA: Insufficient documentation

## 2013-06-10 DIAGNOSIS — M79605 Pain in left leg: Secondary | ICD-10-CM

## 2013-06-10 DIAGNOSIS — S8990XA Unspecified injury of unspecified lower leg, initial encounter: Secondary | ICD-10-CM | POA: Insufficient documentation

## 2013-06-10 DIAGNOSIS — Z79899 Other long term (current) drug therapy: Secondary | ICD-10-CM | POA: Insufficient documentation

## 2013-06-10 DIAGNOSIS — R296 Repeated falls: Secondary | ICD-10-CM | POA: Insufficient documentation

## 2013-06-10 DIAGNOSIS — M549 Dorsalgia, unspecified: Secondary | ICD-10-CM

## 2013-06-10 DIAGNOSIS — M6281 Muscle weakness (generalized): Secondary | ICD-10-CM | POA: Insufficient documentation

## 2013-06-10 DIAGNOSIS — W19XXXA Unspecified fall, initial encounter: Secondary | ICD-10-CM

## 2013-06-10 MED ORDER — HYDROMORPHONE HCL PF 1 MG/ML IJ SOLN
0.5000 mg | Freq: Once | INTRAMUSCULAR | Status: AC
  Administered 2013-06-10: 0.5 mg via INTRAVENOUS
  Filled 2013-06-10: qty 1

## 2013-06-10 MED ORDER — KETOROLAC TROMETHAMINE 30 MG/ML IJ SOLN
30.0000 mg | Freq: Once | INTRAMUSCULAR | Status: DC
  Filled 2013-06-10: qty 1

## 2013-06-10 NOTE — ED Provider Notes (Signed)
History    CSN: [id number] Arrival date & time 06/10/13  1814  First MD Initiated Contact with Patient 06/10/13 1959     Chief Complaint  Patient presents with  . Extremity Weakness  . Fall  . Neck Pain  . Knee Pain   (Consider location/radiation/quality/duration/timing/severity/associated sxs/prior Treatment) Patient is a 37 y.o. female presenting with fall, neck pain, and knee pain. The history is provided by the patient.  Fall This is a new problem. The current episode started today. The problem occurs constantly. Associated symptoms include headaches and neck pain. Pertinent negatives include no abdominal pain, chest pain, fever, nausea or vomiting. 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 When she went to get up to walk, she fell due to generalized numbness and weakness. No syncope, dizziness, pain prior to fall. She states she landed on her left side and has pain in her neck, and 'heaviness' in her left leg with knee pain and headache. No nausea or vomiting. She denies history of similar symptoms..  Neck Pain Associated symptoms: headaches   Associated symptoms: no chest pain and no fever   Knee Pain Associated symptoms: neck pain   Associated symptoms: no fever    Past Medical History  Diagnosis Date  . Asthma     diagnosed as  a young adult   . Rheumatoid arthritis(714.0)    Past Surgical History  Procedure Laterality Date  . Hernia repair    . Abdominal hysterectomy    . Cystectomy    . Cholecystectomy    . Tubal ligation    . Breast reduction surgery    . Appendectomy     Family History  Problem Relation Age of Onset  . Breast cancer Father     died  . Lupus Maternal Aunt   . Rheum arthritis Mother   . Rheum arthritis Father   . Rheum arthritis Maternal Grandmother   . Rheum arthritis Maternal Grandfather   . Rheum arthritis Paternal Grandfather   . Rheum arthritis Paternal Grandfather   .  Rheum arthritis Paternal Grandmother    History  Substance Use Topics  . Smoking status: Never Smoker   . Smokeless tobacco: Never Used  . Alcohol Use: No   OB History   Grav Para Term Preterm Abortions TAB SAB Ect Mult Living                 Review of Systems  Constitutional: Negative for fever.  HENT: Positive for neck pain.   Respiratory: Negative for shortness of breath.   Cardiovascular: Negative for chest pain.  Gastrointestinal: Negative for nausea, vomiting and abdominal pain.  Genitourinary: Negative for dysuria.  Musculoskeletal: Positive for extremity weakness.       See HPI.  Skin: Negative for wound.  Neurological: Positive for headaches.    Allergies  Bee venom; Diclofenac; Metoclopramide; and Tramadol  Home Medications   Current Outpatient Rx  Name  Route  Sig  Dispense  Refill  . albuterol (PROVENTIL HFA;VENTOLIN HFA) 108 (90 BASE) MCG/ACT inhaler   Inhalation   Inhale 2 puffs into the lungs every 6 (six) hours as needed. For shortness of breath         . diazepam (VALIUM) 10 MG tablet   Oral   Take 10 mg by mouth 2 (two) times daily as needed. For arthritis         . ibuprofen (ADVIL,MOTRIN) 200 MG tablet   Oral  Take 400 mg by mouth every 6 (six) hours as needed for pain.         . naproxen sodium (ANAPROX) 220 MG tablet   Oral   Take 880 mg by mouth 2 (two) times daily.         Marland Kitchen oxyCODONE-acetaminophen (PERCOCET/ROXICET) 5-325 MG per tablet   Oral   Take 1 tablet by mouth every 4 (four) hours as needed for pain.         . promethazine (PHENERGAN) 25 MG tablet   Oral   Take 1 tablet (25 mg total) by mouth every 6 (six) hours as needed for nausea.   15 tablet   0   . ranitidine (ZANTAC) 150 MG capsule   Oral   Take 1 capsule (150 mg total) by mouth daily.   60 capsule   0    BP 121/82  Pulse 82  Temp(Src) 98.5 F (36.9 C) (Oral)  Resp 20  Ht 5\' 5"  (1.651 m)  Wt 254 lb (115.214 kg)  BMI 42.27 kg/m2  SpO2  100% Physical Exam  Constitutional: She is oriented to person, place, and time. She appears well-developed and well-nourished.  HENT:  Head: Normocephalic.  Eyes: Conjunctivae are normal.  Neck: Normal range of motion. Neck supple.  Cardiovascular: Normal rate, regular rhythm and intact distal pulses.   Pulmonary/Chest: Effort normal and breath sounds normal.  Abdominal: Soft. Bowel sounds are normal. There is no tenderness. There is no rebound and no guarding.  Musculoskeletal: Normal range of motion. She exhibits no edema.  Right paracervical tenderness without swelling or bruising. Left lower extremity has a FROM, no swelling or wound. Joints stable.   Neurological: She is alert and oriented to person, place, and time.  Left lower extremity weakness, 3-4/5 strength. No sensory deficits.  Skin: Skin is warm and dry. No rash noted.  Psychiatric: She has a normal mood and affect.    ED Course  Procedures (including critical care time) Labs Reviewed - No data to display No results found. No diagnosis found. 1. Fall 2. Lower extremity pain 3. Upper back pain MDM  Pain temporarily better with IV Dilaudid, requesting additional pain medication. Imaging is negative, no neurologic deficits or objective exam findings to suggest acute process. Stable for discharge.   Arnoldo Hooker, PA-C 06/10/13 2237

## 2013-06-10 NOTE — ED Provider Notes (Signed)
Medical screening examination/treatment/procedure(s) were performed by non-physician practitioner and as supervising physician I was immediately available for consultation/collaboration.   Gwyneth Sprout, MD 06/10/13 2302

## 2013-06-10 NOTE — ED Notes (Signed)
Patient states that she fell asleep at the table. States when she was getting up from the table her "body was numb" and fell on the floor. States that stayed on the floor about 30 minutes. Presents c/o neck pain, bilateral knee pain, headache, left shoulder, and wrist pain.

## 2013-10-19 ENCOUNTER — Other Ambulatory Visit: Payer: Self-pay

## 2013-10-27 ENCOUNTER — Emergency Department (HOSPITAL_COMMUNITY)
Admission: EM | Admit: 2013-10-27 | Discharge: 2013-10-28 | Disposition: A | Discharge status: Home / Self Care | Payer: BC Managed Care – PPO | Attending: Emergency Medicine | Admitting: Emergency Medicine

## 2013-10-27 ENCOUNTER — Emergency Department (HOSPITAL_COMMUNITY): Payer: BC Managed Care – PPO

## 2013-10-27 DIAGNOSIS — R079 Chest pain, unspecified: Secondary | ICD-10-CM

## 2013-10-27 DIAGNOSIS — M069 Rheumatoid arthritis, unspecified: Secondary | ICD-10-CM | POA: Insufficient documentation

## 2013-10-27 DIAGNOSIS — Z79899 Other long term (current) drug therapy: Secondary | ICD-10-CM | POA: Insufficient documentation

## 2013-10-27 DIAGNOSIS — R0602 Shortness of breath: Secondary | ICD-10-CM | POA: Insufficient documentation

## 2013-10-27 DIAGNOSIS — J45909 Unspecified asthma, uncomplicated: Secondary | ICD-10-CM | POA: Insufficient documentation

## 2013-10-27 DIAGNOSIS — R062 Wheezing: Secondary | ICD-10-CM | POA: Insufficient documentation

## 2013-10-27 MED ORDER — HYDROMORPHONE HCL PF 1 MG/ML IJ SOLN
1.0000 mg | Freq: Once | INTRAMUSCULAR | Status: AC
  Administered 2013-10-27: 1 mg via INTRAVENOUS
  Filled 2013-10-27: qty 1

## 2013-10-27 NOTE — ED Notes (Signed)
MD at bedside. 

## 2013-10-27 NOTE — ED Notes (Signed)
Pt arrived to ED with a complaint of chest pain.  Pt states she has been having chest pain since this am.  Pt states she has had two nebulizer treatments  Pt states that the pain has been ongoing.

## 2013-10-28 ENCOUNTER — Emergency Department (HOSPITAL_COMMUNITY): Payer: BC Managed Care – PPO

## 2013-10-28 LAB — POCT I-STAT, CHEM 8
BUN: 10 mg/dL (ref 6–23)
Calcium, Ion: 1.2 mmol/L (ref 1.12–1.23)
Chloride: 103 mEq/L (ref 96–112)
Creatinine, Ser: 1.1 mg/dL (ref 0.50–1.10)
Glucose, Bld: 92 mg/dL (ref 70–99)
TCO2: 26 mmol/L (ref 0–100)

## 2013-10-28 LAB — BASIC METABOLIC PANEL
BUN: 11 mg/dL (ref 6–23)
CO2: 28 mEq/L (ref 19–32)
Calcium: 9.2 mg/dL (ref 8.4–10.5)
Creatinine, Ser: 0.87 mg/dL (ref 0.50–1.10)
GFR calc Af Amer: >90 mL/min (ref >90)
GFR calc non Af Amer: 83 mL/min — ABNORMAL LOW (ref >90)
Glucose, Bld: 93 mg/dL (ref 70–99)
Sodium: 137 mEq/L (ref 135–145)

## 2013-10-28 LAB — CBC
Hemoglobin: 11.7 g/dL — ABNORMAL LOW (ref 12.0–15.0)
MCH: 30 pg (ref 26.0–34.0)
MCHC: 33.1 g/dL (ref 30.0–36.0)
Platelets: 273 10*3/uL (ref 150–400)

## 2013-10-28 LAB — POCT I-STAT TROPONIN I: Troponin i, poc: 0 ng/mL (ref 0.00–0.08)

## 2013-10-28 LAB — D-DIMER, QUANTITATIVE: D-Dimer, Quant: 0.7 ug/mL-FEU — ABNORMAL HIGH (ref 0.00–0.48)

## 2013-10-28 MED ORDER — HYDROMORPHONE HCL PF 1 MG/ML IJ SOLN
1.0000 mg | Freq: Once | INTRAMUSCULAR | Status: AC
  Administered 2013-10-28: 1 mg via INTRAVENOUS
  Filled 2013-10-28: qty 1

## 2013-10-28 MED ORDER — IOHEXOL 350 MG/ML SOLN
100.0000 mL | Freq: Once | INTRAVENOUS | Status: AC | PRN
  Administered 2013-10-28: 100 mL via INTRAVENOUS

## 2013-10-28 MED ORDER — LORAZEPAM 2 MG/ML IJ SOLN
1.0000 mg | Freq: Once | INTRAMUSCULAR | Status: AC
  Administered 2013-10-28: 1 mg via INTRAVENOUS
  Filled 2013-10-28: qty 1

## 2013-10-28 MED ORDER — FENTANYL CITRATE 0.05 MG/ML IJ SOLN
50.0000 ug | Freq: Once | INTRAMUSCULAR | Status: AC
  Administered 2013-10-28: 50 ug via INTRAVENOUS
  Filled 2013-10-28: qty 2

## 2013-10-28 MED ORDER — HYDROCODONE-ACETAMINOPHEN 5-325 MG PO TABS: 1.0000 | ORAL_TABLET | ORAL | Status: DC | PRN

## 2013-10-28 NOTE — ED Notes (Signed)
Pt.claimed of SOB and hoarseness of voice, also claimed that chest pain is not relieved eve after the 2 doses of diluadid IV meds given. Pt. 'sO2 sat at 98% on RA. Place at 2 L/min of Anna.  Pt. Claimed of being asthmatic and last breathing treatment  Was 2days ago.  Assessed for breathing sounds, no wheezing noted at this time. TO NOTIFY MD.

## 2013-10-28 NOTE — ED Notes (Signed)
Patient transported to CT 

## 2013-10-28 NOTE — ED Notes (Signed)
MD at bedside. 

## 2013-10-28 NOTE — Discharge Instructions (Signed)
Chest Pain (Nonspecific) °It is often hard to give a specific diagnosis for the cause of chest pain. There is always a chance that your pain could be related to something serious, such as a heart attack or a blood clot in the lungs. You need to follow up with your caregiver for further evaluation. °CAUSES  °· Heartburn. °· Pneumonia or bronchitis. °· Anxiety or stress. °· Inflammation around your heart (pericarditis) or lung (pleuritis or pleurisy). °· A blood clot in the lung. °· A collapsed lung (pneumothorax). It can develop suddenly on its own (spontaneous pneumothorax) or from injury (trauma) to the chest. °· Shingles infection (herpes zoster virus). °The chest wall is composed of bones, muscles, and cartilage. Any of these can be the source of the pain. °· The bones can be bruised by injury. °· The muscles or cartilage can be strained by coughing or overwork. °· The cartilage can be affected by inflammation and become sore (costochondritis). °DIAGNOSIS  °Lab tests or other studies, such as X-rays, electrocardiography, stress testing, or cardiac imaging, may be needed to find the cause of your pain.  °TREATMENT  °· Treatment depends on what may be causing your chest pain. Treatment may include: °· Acid blockers for heartburn. °· Anti-inflammatory medicine. °· Pain medicine for inflammatory conditions. °· Antibiotics if an infection is present. °· You may be advised to change lifestyle habits. This includes stopping smoking and avoiding alcohol, caffeine, and chocolate. °· You may be advised to keep your head raised (elevated) when sleeping. This reduces the chance of acid going backward from your stomach into your esophagus. °· Most of the time, nonspecific chest pain will improve within 2 to 3 days with rest and mild pain medicine. °HOME CARE INSTRUCTIONS  °· If antibiotics were prescribed, take your antibiotics as directed. Finish them even if you start to feel better. °· For the next few days, avoid physical  activities that bring on chest pain. Continue physical activities as directed. °· Do not smoke. °· Avoid drinking alcohol. °· Only take over-the-counter or prescription medicine for pain, discomfort, or fever as directed by your caregiver. °· Follow your caregiver's suggestions for further testing if your chest pain does not go away. °· Keep any follow-up appointments you made. If you do not go to an appointment, you could develop lasting (chronic) problems with pain. If there is any problem keeping an appointment, you must call to reschedule. °SEEK MEDICAL CARE IF:  °· You think you are having problems from the medicine you are taking. Read your medicine instructions carefully. °· Your chest pain does not go away, even after treatment. °· You develop a rash with blisters on your chest. °SEEK IMMEDIATE MEDICAL CARE IF:  °· You have increased chest pain or pain that spreads to your arm, neck, jaw, back, or abdomen. °· You develop shortness of breath, an increasing cough, or you are coughing up blood. °· You have severe back or abdominal pain, feel nauseous, or vomit. °· You develop severe weakness, fainting, or chills. °· You have a fever. °THIS IS AN EMERGENCY. Do not wait to see if the pain will go away. Get medical help at once. Call your local emergency services (911 in [place] Do not drive yourself to the hospital. °MAKE SURE YOU:  °· Understand these instructions. °· Will watch your condition. °· Will get help right away if you are not doing well or get worse. °Document Released: 09/09/2005 Document Revised: 02/22/2012 Document Reviewed: 07/05/2008 °[employer]® Patient Information ©2014 [employer]   [employer]. ° °

## 2013-10-30 NOTE — ED Provider Notes (Signed)
CSN: [id number]     Arrival date & time 10/27/13  2231 History   First MD Initiated Contact with Patient 10/27/13 2303     Chief Complaint  Patient presents with  . Chest Pain    HPI Patient reports chest tightness as well as some hoarseness of reports and shortness of breath over the past several days.  She denies fevers and chills.  Mild productive cough.  She denies unilateral leg swelling.  No history of recent travel or surgery.  She denies a history of DVT or pulmonary embolism before in the past.  She is 37 years old.  No prior history of cardiac disease.  She states her pain is worse with palpation of her chest.  She denies orthopnea.  No significant exertional shortness of breath.   Past Medical History  Diagnosis Date  . Asthma     diagnosed as  a young adult   . Rheumatoid arthritis(714.0)    Past Surgical History  Procedure Laterality Date  . Hernia repair    . Abdominal hysterectomy    . Cystectomy    . Cholecystectomy    . Tubal ligation    . Breast reduction surgery    . Appendectomy     Family History  Problem Relation Age of Onset  . Breast cancer Father     died  . Lupus Maternal Aunt   . Rheum arthritis Mother   . Rheum arthritis Father   . Rheum arthritis Maternal Grandmother   . Rheum arthritis Maternal Grandfather   . Rheum arthritis Paternal Grandfather   . Rheum arthritis Paternal Grandfather   . Rheum arthritis Paternal Grandmother    History  Substance Use Topics  . Smoking status: Never Smoker   . Smokeless tobacco: Never Used  . Alcohol Use: No   OB History   Grav Para Term Preterm Abortions TAB SAB Ect Mult Living                 Review of Systems  All other systems reviewed and are negative.    Allergies  Bee venom; Diclofenac; Toradol; Metoclopramide; and Tramadol  Home Medications   Current Outpatient Rx  Name  Route  Sig  Dispense  Refill  . albuterol (PROVENTIL HFA;VENTOLIN HFA) 108 (90 BASE) MCG/ACT inhaler  Inhalation   Inhale 2 puffs into the lungs every 6 (six) hours as needed. For shortness of breath         . naproxen (NAPROSYN) 250 MG tablet   Oral   Take 500 mg by mouth 2 (two) times daily with a meal.         . oxyCODONE-acetaminophen (PERCOCET) 10-325 MG per tablet   Oral   Take 1 tablet by mouth every 4 (four) hours as needed for pain.         Marland Kitchen HYDROcodone-acetaminophen (NORCO/VICODIN) 5-325 MG per tablet   Oral   Take 1 tablet by mouth every 4 (four) hours as needed for moderate pain.   15 tablet   0    BP 126/60  Pulse 72  Temp(Src) 97.7 F (36.5 C) (Oral)  Resp 16  SpO2 100% Physical Exam  Nursing note and vitals reviewed. Constitutional: She is oriented to person, place, and time. She appears well-developed and well-nourished. No distress.  HENT:  Head: Normocephalic and atraumatic.  Eyes: EOM are normal.  Neck: Normal range of motion.  Cardiovascular: Normal rate, regular rhythm and normal heart sounds.   Pulmonary/Chest: Effort normal. She has wheezes.  Abdominal: Soft. She exhibits no distension. There is no tenderness.  Musculoskeletal: Normal range of motion. She exhibits no edema and no tenderness.  Neurological: She is alert and oriented to person, place, and time.  Skin: Skin is warm and dry.  Psychiatric: She has a normal mood and affect. Judgment normal.    ED Course  Procedures (including critical care time) Labs Review Labs Reviewed  CBC - Abnormal; Notable for the following:    Hemoglobin 11.7 (*)    HCT 35.3 (*)    All other components within normal limits  BASIC METABOLIC PANEL - Abnormal; Notable for the following:    GFR calc non Af Amer 83 (*)    All other components within normal limits  D-DIMER, QUANTITATIVE - Abnormal; Notable for the following:    D-Dimer, Quant 0.70 (*)    All other components within normal limits  TROPONIN I  POCT I-STAT, CHEM 8  POCT I-STAT TROPONIN I   Imaging Review No results found.  EKG  Interpretation    Date/Time:    Ventricular Rate:  79 PR Interval:  164 QRS Duration: 97 QT Interval:  359 QTC Calculation: 411 R Axis:   38 Text Interpretation:  Sinus rhythm Low voltage, precordial leads No significant change since last tracing            MDM   1. Chest pain    Feels slightly better after breathing treatments.  Discharge home.  My suspicion is that this may represent bronchitis/bronchospasm.  Doubt ACS.  Doubt pulmonary embolism.  Given the patient is a low-risk with normal vital signs the d-dimer less than 1 is adequate for her.  Discharge home in good condition.  Close PCP followup.  She understands to return to the ER for new or worsening symptoms    Lyanne Co, MD 10/30/13 2102

## 2013-11-09 ENCOUNTER — Encounter (HOSPITAL_COMMUNITY): Payer: Self-pay | Admitting: Emergency Medicine

## 2013-11-09 ENCOUNTER — Emergency Department (HOSPITAL_COMMUNITY)
Admission: EM | Admit: 2013-11-09 | Discharge: 2013-11-09 | Disposition: A | Discharge status: Home / Self Care | Payer: BC Managed Care – PPO | Attending: Emergency Medicine | Admitting: Emergency Medicine

## 2013-11-09 ENCOUNTER — Emergency Department (HOSPITAL_COMMUNITY): Payer: BC Managed Care – PPO

## 2013-11-09 DIAGNOSIS — J45901 Unspecified asthma with (acute) exacerbation: Secondary | ICD-10-CM | POA: Insufficient documentation

## 2013-11-09 DIAGNOSIS — Z7982 Long term (current) use of aspirin: Secondary | ICD-10-CM | POA: Insufficient documentation

## 2013-11-09 DIAGNOSIS — R0789 Other chest pain: Secondary | ICD-10-CM | POA: Insufficient documentation

## 2013-11-09 DIAGNOSIS — M069 Rheumatoid arthritis, unspecified: Secondary | ICD-10-CM | POA: Insufficient documentation

## 2013-11-09 DIAGNOSIS — R079 Chest pain, unspecified: Secondary | ICD-10-CM

## 2013-11-09 DIAGNOSIS — R42 Dizziness and giddiness: Secondary | ICD-10-CM | POA: Insufficient documentation

## 2013-11-09 DIAGNOSIS — Z79899 Other long term (current) drug therapy: Secondary | ICD-10-CM | POA: Insufficient documentation

## 2013-11-09 LAB — POCT I-STAT, CHEM 8
BUN: 8 mg/dL (ref 6–23)
Calcium, Ion: 1.23 mmol/L (ref 1.12–1.23)
Chloride: 105 mEq/L (ref 96–112)
Creatinine, Ser: 0.9 mg/dL (ref 0.50–1.10)
Glucose, Bld: 92 mg/dL (ref 70–99)
HCT: 39 % (ref 36.0–46.0)
Potassium: 3.6 mEq/L (ref 3.5–5.1)

## 2013-11-09 MED ORDER — MORPHINE SULFATE 4 MG/ML IJ SOLN
2.0000 mg | Freq: Once | INTRAMUSCULAR | Status: AC
  Administered 2013-11-09: 2 mg via INTRAVENOUS
  Filled 2013-11-09: qty 1

## 2013-11-09 MED ORDER — LORAZEPAM 1 MG PO TABS: 1.0000 mg | ORAL_TABLET | Freq: Three times a day (TID) | ORAL | Status: DC | PRN

## 2013-11-09 MED ORDER — LORAZEPAM 1 MG PO TABS
1.0000 mg | ORAL_TABLET | Freq: Once | ORAL | Status: AC
  Administered 2013-11-09: 1 mg via ORAL
  Filled 2013-11-09: qty 1

## 2013-11-09 MED ORDER — SODIUM CHLORIDE 0.9 % IV BOLUS (SEPSIS)
1000.0000 mL | Freq: Once | INTRAVENOUS | Status: AC
  Administered 2013-11-09: 1000 mL via INTRAVENOUS

## 2013-11-09 NOTE — ED Provider Notes (Signed)
CSN: [id number]     Arrival date & time 11/09/13  0013 History   First MD Initiated Contact with Patient 11/09/13 0020     Chief Complaint  Patient presents with  . Chest Pain  . Shortness of Breath   (Consider location/radiation/quality/duration/timing/severity/associated sxs/prior Treatment) HPI Comments: Patient is a 37 year old female who presents for intermittent chest pain x3 weeks. Patient states the pain is Rusher an aching-like in nature and sporadically radiates to her right arm into her right jaw. Patient states that chest pain is worsened with exertion and leaning forward, but also comes on at rest. She denies any alleviating factors of her symptoms and states she has been taking Vicodin without relief. Symptoms associated with SOB and lightheadedness; denies fever, N/V, abdominal pain, weakness, and syncope. Patient was evaluated for this pain 2 weeks ago at which time patient had a negative CT angiogram ruling out pulmonary embolism. Symptoms at this time or not thought to be related to ACS. She denies a history of hypertension, coronary artery disease, diabetes mellitus, and blood clots or coagulopathies. Patient endorses a family history of MI and her paternal grandmother at an unknown elderly age.  Patient is a 37 y.o. female presenting with chest pain and shortness of breath. The history is provided by the patient. No language interpreter was used.  Chest Pain Associated symptoms: shortness of breath   Associated symptoms: no fever, no nausea, no numbness, not vomiting and no weakness   Shortness of Breath Associated symptoms: chest pain   Associated symptoms: no fever and no vomiting     Past Medical History  Diagnosis Date  . Asthma     diagnosed as  a young adult   . Rheumatoid arthritis(714.0)    Past Surgical History  Procedure Laterality Date  . Hernia repair    . Abdominal hysterectomy    . Cystectomy    . Cholecystectomy    . Tubal ligation    . Breast  reduction surgery    . Appendectomy     Family History  Problem Relation Age of Onset  . Breast cancer Father     died  . Lupus Maternal Aunt   . Rheum arthritis Mother   . Rheum arthritis Father   . Rheum arthritis Maternal Grandmother   . Rheum arthritis Maternal Grandfather   . Rheum arthritis Paternal Grandfather   . Rheum arthritis Paternal Grandfather   . Rheum arthritis Paternal Grandmother    History  Substance Use Topics  . Smoking status: Never Smoker   . Smokeless tobacco: Never Used  . Alcohol Use: No   OB History   Grav Para Term Preterm Abortions TAB SAB Ect Mult Living                 Review of Systems  Constitutional: Negative for fever.  Respiratory: Positive for shortness of breath.   Cardiovascular: Positive for chest pain.  Gastrointestinal: Negative for nausea and vomiting.  Neurological: Positive for light-headedness. Negative for weakness and numbness.  All other systems reviewed and are negative.    Allergies  Bee venom; Diclofenac; Toradol; Metoclopramide; and Tramadol  Home Medications   Current Outpatient Rx  Name  Route  Sig  Dispense  Refill  . albuterol (PROVENTIL HFA;VENTOLIN HFA) 108 (90 BASE) MCG/ACT inhaler   Inhalation   Inhale 2 puffs into the lungs every 6 (six) hours as needed. For shortness of breath         . Ascorbic Acid (VITAMIN C ER  PO)   Oral   Take 1 tablet by mouth daily.         Marland Kitchen aspirin 325 MG tablet   Oral   Take 325 mg by mouth daily.         Marland Kitchen BIOTIN PO   Oral   Take 1 tablet by mouth daily.         Marland Kitchen HYDROcodone-acetaminophen (NORCO/VICODIN) 5-325 MG per tablet   Oral   Take 1 tablet by mouth every 4 (four) hours as needed for moderate pain.         . Thiamine HCl (VITAMIN B-1 PO)   Oral   Take 1 tablet by mouth daily.         Marland Kitchen VITAMIN D, CHOLECALCIFEROL, PO   Oral   Take 1 capsule by mouth daily.         Marland Kitchen LORazepam (ATIVAN) 1 MG tablet   Oral   Take 1 tablet (1 mg total) by  mouth 3 (three) times daily as needed for anxiety.   15 tablet   0    BP 115/67  Temp(Src) 97.8 F (36.6 C) (Oral)  Resp 14  SpO2 98%  Physical Exam  Nursing note and vitals reviewed. Constitutional: She is oriented to person, place, and time. She appears well-developed and well-nourished. No distress.  HENT:  Head: Normocephalic and atraumatic.  Mouth/Throat: Oropharynx is clear and moist. No oropharyngeal exudate.  Eyes: Conjunctivae and EOM are normal. Pupils are equal, round, and reactive to light. No scleral icterus.  Neck: Normal range of motion. Neck supple.  Cardiovascular: Normal rate, regular rhythm and normal heart sounds.   Pulmonary/Chest: Effort normal and breath sounds normal. No respiratory distress. She has no wheezes. She has no rales. She exhibits tenderness (central chest).    Abdominal: Soft. She exhibits no distension. There is no tenderness.  Musculoskeletal: Normal range of motion. She exhibits no edema.  Neurological: She is alert and oriented to person, place, and time.  Skin: Skin is warm and dry. No rash noted. She is not diaphoretic. No erythema. No pallor.  Psychiatric: She has a normal mood and affect. Her behavior is normal.    ED Course  Procedures (including critical care time) Labs Review Labs Reviewed  POCT I-STAT, CHEM 8  POCT I-STAT TROPONIN I   Imaging Review Dg Chest 2 View  11/09/2013   CLINICAL DATA:  Chest pain and shortness of breath for 3 weeks.  EXAM: CHEST  2 VIEW  COMPARISON:  CT PE study 10/28/2013.  Chest 10/28/2013.  FINDINGS: The heart size and mediastinal contours are within normal limits. Both lungs are clear. The visualized skeletal structures are unremarkable.  IMPRESSION: No active cardiopulmonary disease.   Electronically Signed   By: Burman Nieves M.D.   On: 11/09/2013 01:04   Ct Angio Chest W/cm &/or Wo Cm  10/28/2013   CLINICAL DATA:  Persistent chest pain and elevated D-dimer.  EXAM: CT ANGIOGRAPHY CHEST WITH  CONTRAST  TECHNIQUE: Multidetector CT imaging of the chest was performed using the standard protocol during bolus administration of intravenous contrast. Multiplanar CT image reconstructions including MIPs were obtained to evaluate the vascular anatomy.  CONTRAST:  OMNIPAQUE IOHEXOL 350 MG/ML SOLN  COMPARISON:  Chest radiograph performed earlier today at 12:21 a.m., and CTA of the chest performed 11/14/2012  FINDINGS: There is no evidence of central pulmonary embolus. Evaluation for pulmonary embolus is suboptimal due to limitations in the timing of the contrast bolus.  Minimal bilateral atelectasis  is noted. The lungs are otherwise clear. There is no evidence of significant focal consolidation, pleural effusion or pneumothorax. No masses are identified; no abnormal focal contrast enhancement is seen.  The mediastinum is unremarkable in appearance. No mediastinal lymphadenopathy is seen. No pericardial effusion is identified. The great vessels are grossly unremarkable in appearance. No axillary lymphadenopathy is seen. The visualized portions of the thyroid gland are unremarkable in appearance.  The visualized portions of the liver and spleen are unremarkable.  No acute osseous abnormalities are seen.  Review of the MIP images confirms the above findings.  IMPRESSION: 1. No evidence of central pulmonary embolus. 2. Minimal bilateral atelectasis noted; lungs otherwise clear.   Electronically Signed   By: Roanna Raider M.D.   On: 10/28/2013 03:47   EKG Interpretation    Date/Time:  Thursday November 09 2013 00:16:56 EST Ventricular Rate:  69 PR Interval:  165 QRS Duration: 101 QT Interval:  409 QTC Calculation: 438 R Axis:   57 Text Interpretation:  Sinus rhythm RSR' in V1 or V2, right VCD or RVH Nonspecific ST abnormality No significant change since last tracing Confirmed by OPITZ  MD, BRIAN ([id number]) on 11/09/2013 12:22:03 AM            MDM   1. Chest pain    37 year old female with no  cardiac risk factors presents for intermittent chest pain with onset 3 weeks ago. Patient well and nontoxic appearing, hemodynamically stable, and afebrile on presentation to ED tonight. Patient was seen for same complaints 2 weeks ago with an overall negative workup including negative CT angiogram chest for PE rule out. Patient has been taking Norco prescribed to her at this visit without relief of her chest pain.  Patient's EKG today is stable, without STEMI or ischemic changes. Troponin 0.00, chest x-ray stable, and chemistries normal. Doubt that symptoms are related to ACS given long-standing nature of chest pain, stability of symptoms, physical exam, and reassuring workup today. Supported by HEART score of 1, correlating with low risk of major coronary event (0.9-1.7%). Symptoms treated in ED with morphine and Ativan with mild relief. Do not believe additional emergent workup is indicated at this time. Patient is stable and appropriate for discharge with cardiology followup and instruction to schedule an outpatient stress test. Return precautions discussed with the patient who verbalizes comfort and understanding with this discharge plan with no unaddressed concerns.[name] Vitals:   11/09/13 0021 11/09/13 0230 11/09/13 0257  BP: 115/67  118/71  Pulse:  69   Temp: 97.8 F (36.6 C)  97.7 F (36.5 C)  TempSrc: Oral  Oral  Resp: 14 17 16   SpO2: 98% 100% 100%     Antony Madura, PA-C 11/09/13 [phone]

## 2013-11-09 NOTE — ED Notes (Signed)
Pt reports that she has been having intermittent CP for the past week, began having pain radiating to R arm and R jaw tonight, states she is dizzy and lightheaded and ShOB upon walking. No previous cardiac health hx

## 2013-11-09 NOTE — ED Provider Notes (Signed)
Medical screening examination/treatment/procedure(s) were performed by non-physician practitioner and as supervising physician I was immediately available for consultation/collaboration.  EKG Interpretation    Date/Time:  Thursday November 09 2013 00:16:56 EST Ventricular Rate:  69 PR Interval:  165 QRS Duration: 101 QT Interval:  409 QTC Calculation: 438 R Axis:   57 Text Interpretation:  Sinus rhythm RSR' in V1 or V2, right VCD or RVH Nonspecific ST abnormality No significant change since last tracing Confirmed by Keyia Moretto  MD, Alvis Pulcini [phone]) on 11/09/2013 12:22:03 AM             Sunnie Nielsen, MD 11/09/13 [phone]

## 2013-11-17 ENCOUNTER — Ambulatory Visit (INDEPENDENT_AMBULATORY_CARE_PROVIDER_SITE_OTHER): Payer: BC Managed Care – PPO | Admitting: Internal Medicine

## 2013-11-17 ENCOUNTER — Encounter: Payer: Self-pay | Admitting: Internal Medicine

## 2013-11-17 VITALS — BP 132/76 | HR 84 | Ht 65.0 in | Wt 266.0 lb

## 2013-11-17 DIAGNOSIS — Z0000 Encounter for general adult medical examination without abnormal findings: Secondary | ICD-10-CM

## 2013-11-17 DIAGNOSIS — R079 Chest pain, unspecified: Secondary | ICD-10-CM

## 2013-11-17 LAB — LIPID PANEL
Cholesterol: 178 mg/dL (ref 0–200)
HDL: 32.7 mg/dL — ABNORMAL LOW (ref >39.00)
LDL Cholesterol: 127 mg/dL — ABNORMAL HIGH (ref 0–99)
Total CHOL/HDL Ratio: 5
Triglycerides: 92 mg/dL (ref 0.0–149.0)

## 2013-11-17 MED ORDER — IBUPROFEN 400 MG PO TABS: 400.0000 mg | ORAL_TABLET | Freq: Four times a day (QID) | ORAL | Status: DC | PRN

## 2013-11-17 NOTE — Progress Notes (Signed)
HPI CP 3 to 4 wk  since November. Occurred first with sitting.  QUestion initally GI  Tried sodas, baking soda WIll last up to 4 hours Not associated with activity Can have pain in R jaw.  Pain in R arm  When not having pain trying to get stuff done she feels OK  No CP  No SOB   Rare dizziness.  No syncope   Hydrocodone didn't help Allergies  Allergen Reactions  . Bee Venom Anaphylaxis  . Diclofenac Swelling  . Toradol [Ketorolac Tromethamine] Other (See Comments)    Very sensitive, jittery, patient report uncontrolled jerks"  . Metoclopramide Anxiety and Rash    "never give to me again"  . Tramadol Anxiety, Rash and Other (See Comments)    Reaction: "felt like I was about to lose my mind"     Current Outpatient Prescriptions  Medication Sig Dispense Refill  . albuterol (PROVENTIL HFA;VENTOLIN HFA) 108 (90 BASE) MCG/ACT inhaler Inhale 2 puffs into the lungs every 6 (six) hours as needed. For shortness of breath      . aspirin 325 MG tablet Take 325 mg by mouth daily.      Marland Kitchen LORazepam (ATIVAN) 1 MG tablet Take 1 tablet (1 mg total) by mouth 3 (three) times daily as needed for anxiety.  15 tablet  0  . Ascorbic Acid (VITAMIN C ER PO) Take 1 tablet by mouth daily.      Marland Kitchen BIOTIN PO Take 1 tablet by mouth daily.       No current facility-administered medications for this visit.    Past Medical History  Diagnosis Date  . Asthma     diagnosed as  a young adult   . Rheumatoid arthritis(714.0)     Past Surgical History  Procedure Laterality Date  . Hernia repair    . Abdominal hysterectomy    . Cystectomy    . Cholecystectomy    . Tubal ligation    . Breast reduction surgery    . Appendectomy      Family History  Problem Relation Age of Onset  . Breast cancer Father     died  . Lupus Maternal Aunt   . Rheum arthritis Mother   . Rheum arthritis Father   . Rheum arthritis Maternal Grandmother   . Rheum arthritis Maternal Grandfather   . Rheum arthritis Paternal  Grandfather   . Rheum arthritis Paternal Grandfather   . Rheum arthritis Paternal Grandmother     History   Social History  . Marital Status: Divorced    Spouse Name: N/A    Number of Children: N/A  . Years of Education: N/A   Occupational History  . Not on file.   Social History Main Topics  . Smoking status: Never Smoker   . Smokeless tobacco: Never Used  . Alcohol Use: No  . Drug Use: No  . Sexual Activity: No   Other Topics Concern  . Not on file   Social History Narrative   Her father is 74 and has had a history of bleeding ulcers.    Her mother is 49 and has uterine fibroids and had some endometrial    dysplasia.  She has a 72 year old brother and a 53 year old sister who are    healthy.  There is no known family history of colon, breast, or ovarian    cancer.  Her paternal grandmother has heart disease.  There is history of    diabetes in several members on both sides of  the family.      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  They are negative to the above problem except as previously stated.  Vital Signs: BP 132/76  Pulse 84  Ht 5\' 5"  (1.651 m)  Wt 266 lb (120.657 kg)  BMI 44.26 kg/m2  Physical Exam Patient is a morbidly obese 37 yo in NAD HEENT:  Normocephalic, atraumatic. EOMI, PERRLA.  Neck: JVP is normal.  No bruits.  Lungs: clear to auscultation. No rales no wheezes.  Heart: Regular rate and rhythm. Normal S1, S2. No S3.   No significant murmurs. PMI not displaced. Chest  Mild tenderness  Abdomen:  Supple, nontender. Normal bowel sounds. No masses. No hepatomegaly.  Extremities:   Good distal pulses throughout. No lower extremity edema.  Musculoskeletal :moving all extremities.  Neuro:   alert and oriented x3.  CN II-XII grossly intact.  ekg SR 84 bpm  Incomp RBBB= Assessment and Plan:  1.  CP  Atypical  I do not think it is cardiac   Question GI  She has tried some nexium  Would continue  QUestion musculoskel: ASA  2.  HCM  Will check lipids  Encouraged patient to increase her activity  She needs to lose wt.  Discussed diet.    F/U prn

## 2013-11-17 NOTE — Patient Instructions (Addendum)
Your physician recommends that you have lab work today  Your physician recommends that you schedule a follow-up appointment in: as needed  ENTERIC COATED ASPIRIN 325 MG ( TAKE TWO IN AM & TWO IN PM ) FOR ONE WEEK THE SECOND WEEK DECREASE ENTERIC COATED ASPIRIN 325 MG TO ONE IN AM & ONE IN PM   WHEN YOU START TAKING ENTERIC ASPIRIN BEGIN TAKING PEPCID DAILY

## 2013-11-21 ENCOUNTER — Encounter: Payer: Self-pay | Admitting: Internal Medicine

## 2014-03-03 ENCOUNTER — Encounter (HOSPITAL_COMMUNITY): Payer: Self-pay | Admitting: Emergency Medicine

## 2014-03-03 ENCOUNTER — Emergency Department (HOSPITAL_COMMUNITY)
Admission: EM | Admit: 2014-03-03 | Discharge: 2014-03-03 | Disposition: A | Discharge status: Home / Self Care | Payer: BC Managed Care – PPO | Attending: Emergency Medicine | Admitting: Emergency Medicine

## 2014-03-03 DIAGNOSIS — Z9089 Acquired absence of other organs: Secondary | ICD-10-CM | POA: Insufficient documentation

## 2014-03-03 DIAGNOSIS — R197 Diarrhea, unspecified: Secondary | ICD-10-CM | POA: Insufficient documentation

## 2014-03-03 DIAGNOSIS — R112 Nausea with vomiting, unspecified: Secondary | ICD-10-CM

## 2014-03-03 DIAGNOSIS — R109 Unspecified abdominal pain: Secondary | ICD-10-CM

## 2014-03-03 DIAGNOSIS — Z79899 Other long term (current) drug therapy: Secondary | ICD-10-CM | POA: Insufficient documentation

## 2014-03-03 DIAGNOSIS — M069 Rheumatoid arthritis, unspecified: Secondary | ICD-10-CM | POA: Insufficient documentation

## 2014-03-03 DIAGNOSIS — Z9851 Tubal ligation status: Secondary | ICD-10-CM | POA: Insufficient documentation

## 2014-03-03 DIAGNOSIS — J45909 Unspecified asthma, uncomplicated: Secondary | ICD-10-CM | POA: Insufficient documentation

## 2014-03-03 DIAGNOSIS — IMO0002 Reserved for concepts with insufficient information to code with codable children: Secondary | ICD-10-CM | POA: Insufficient documentation

## 2014-03-03 DIAGNOSIS — Z90710 Acquired absence of both cervix and uterus: Secondary | ICD-10-CM | POA: Insufficient documentation

## 2014-03-03 LAB — COMPREHENSIVE METABOLIC PANEL
ALBUMIN: 3.7 g/dL (ref 3.5–5.2)
ALT: 12 U/L (ref 0–35)
AST: 14 U/L (ref 0–37)
Alkaline Phosphatase: 104 U/L (ref 39–117)
BUN: 9 mg/dL (ref 6–23)
CHLORIDE: 98 meq/L (ref 96–112)
CO2: 24 meq/L (ref 19–32)
CREATININE: 0.62 mg/dL (ref 0.50–1.10)
Calcium: 9.8 mg/dL (ref 8.4–10.5)
GFR calc Af Amer: >90 mL/min (ref >90)
Glucose, Bld: 92 mg/dL (ref 70–99)
POTASSIUM: 4.3 meq/L (ref 3.7–5.3)
Sodium: 137 mEq/L (ref 137–147)
Total Bilirubin: 0.6 mg/dL (ref 0.3–1.2)
Total Protein: 8.2 g/dL (ref 6.0–8.3)

## 2014-03-03 LAB — CBC WITH DIFFERENTIAL/PLATELET
BASOS ABS: 0 10*3/uL (ref 0.0–0.1)
Basophils Relative: 0 % (ref 0–1)
Eosinophils Absolute: 0.1 10*3/uL (ref 0.0–0.7)
Eosinophils Relative: 2 % (ref 0–5)
HCT: 39.5 % (ref 36.0–46.0)
HEMOGLOBIN: 13 g/dL (ref 12.0–15.0)
LYMPHS PCT: 23 % (ref 12–46)
Lymphs Abs: 1.4 10*3/uL (ref 0.7–4.0)
MCH: 29.2 pg (ref 26.0–34.0)
MCHC: 32.9 g/dL (ref 30.0–36.0)
MCV: 88.8 fL (ref 78.0–100.0)
MONO ABS: 0.3 10*3/uL (ref 0.1–1.0)
Monocytes Relative: 5 % (ref 3–12)
NEUTROS ABS: 4.1 10*3/uL (ref 1.7–7.7)
NEUTROS PCT: 69 % (ref 43–77)
Platelets: 243 10*3/uL (ref 150–400)
RBC: 4.45 MIL/uL (ref 3.87–5.11)
RDW: 13.2 % (ref 11.5–15.5)
WBC: 6 10*3/uL (ref 4.0–10.5)

## 2014-03-03 LAB — URINALYSIS, ROUTINE W REFLEX MICROSCOPIC
Bilirubin Urine: NEGATIVE
GLUCOSE, UA: NEGATIVE mg/dL
Hgb urine dipstick: NEGATIVE
KETONES UR: NEGATIVE mg/dL
LEUKOCYTES UA: NEGATIVE
Nitrite: NEGATIVE
PROTEIN: NEGATIVE mg/dL
Specific Gravity, Urine: 1.034 — ABNORMAL HIGH (ref 1.005–1.030)
Urobilinogen, UA: 1 mg/dL (ref 0.0–1.0)
pH: 5.5 (ref 5.0–8.0)

## 2014-03-03 LAB — WET PREP, GENITAL
Clue Cells Wet Prep HPF POC: NONE SEEN
Trich, Wet Prep: NONE SEEN
Yeast Wet Prep HPF POC: NONE SEEN

## 2014-03-03 LAB — LIPASE, BLOOD: Lipase: 14 U/L (ref 11–59)

## 2014-03-03 MED ORDER — DICYCLOMINE HCL 10 MG/ML IM SOLN
20.0000 mg | Freq: Once | INTRAMUSCULAR | Status: AC
  Administered 2014-03-03: 20 mg via INTRAMUSCULAR
  Filled 2014-03-03: qty 2

## 2014-03-03 MED ORDER — PROMETHAZINE HCL 25 MG PO TABS: 25.0000 mg | ORAL_TABLET | Freq: Four times a day (QID) | ORAL | Status: DC | PRN

## 2014-03-03 MED ORDER — SODIUM CHLORIDE 0.9 % IV BOLUS (SEPSIS)
1000.0000 mL | INTRAVENOUS | Status: AC
  Administered 2014-03-03: 1000 mL via INTRAVENOUS

## 2014-03-03 MED ORDER — ACETAMINOPHEN 500 MG PO TABS: 1000.0000 mg | ORAL_TABLET | Freq: Once | ORAL | Status: DC

## 2014-03-03 MED ORDER — ONDANSETRON 4 MG PO TBDP
4.0000 mg | ORAL_TABLET | Freq: Once | ORAL | Status: AC
  Administered 2014-03-03: 4 mg via ORAL
  Filled 2014-03-03: qty 1

## 2014-03-03 MED ORDER — PANTOPRAZOLE SODIUM 40 MG IV SOLR
40.0000 mg | INTRAVENOUS | Status: AC
  Administered 2014-03-03: 40 mg via INTRAVENOUS
  Filled 2014-03-03: qty 40

## 2014-03-03 NOTE — ED Provider Notes (Signed)
CSN: [id number]     Arrival date & time 03/03/14  0520 History   First MD Initiated Contact with Patient 03/03/14 [phone]     Chief Complaint  Patient presents with  . Abdominal Pain     (Consider location/radiation/quality/duration/timing/severity/associated sxs/prior Treatment) HPI Pt is a 38yo female with hx of RA presenting to ED c/o abdominal pain associated with n/v/d.  Pt states she normally has some abdominal pain due to multiple abdominal surgeries including appendectomy, cholecystectomy, total hysterectomy and hernia repair, however she developed LLQ pain that is sharp in nature 1 week ago. Pain is 8/10, waxes and wanes, worse with walking.  Pt has tried norco which she has for her RA and imodium w/o relief of symptoms. Reports having about 10 episodes of NBNB emesis and about 10 episodes of diarrhea w/o blood or mucous yesterday. Last episode of emesis and diarrhea was last night around 10pm.  Denies fevers. Denies sick contacts or recent travel.   Past Medical History  Diagnosis Date  . Asthma     diagnosed as  a young adult   . Rheumatoid arthritis(714.0)    Past Surgical History  Procedure Laterality Date  . Hernia repair    . Abdominal hysterectomy    . Cystectomy    . Cholecystectomy    . Tubal ligation    . Breast reduction surgery    . Appendectomy     Family History  Problem Relation Age of Onset  . Rheum arthritis Father   . Breast cancer Father     died  . Lupus Maternal Aunt   . Rheum arthritis Mother   . Rheum arthritis Maternal Grandmother   . Rheum arthritis Maternal Grandfather   . Rheum arthritis Paternal Grandfather   . Rheum arthritis Paternal Grandmother    History  Substance Use Topics  . Smoking status: Never Smoker   . Smokeless tobacco: Never Used  . Alcohol Use: No   OB History   Grav Para Term Preterm Abortions TAB SAB Ect Mult Living                 Review of Systems  Constitutional: Positive for appetite change. Negative for  fever, chills and unexpected weight change.  Respiratory: Negative for shortness of breath.   Cardiovascular: Negative for chest pain.  Gastrointestinal: Positive for nausea, vomiting, abdominal pain and diarrhea. Negative for blood in stool.  Genitourinary: Negative for dysuria, frequency, hematuria, flank pain, decreased urine volume, vaginal bleeding, vaginal discharge, vaginal pain, menstrual problem and pelvic pain.  Musculoskeletal: Negative for back pain.  All other systems reviewed and are negative.      Allergies  Bee venom; Diclofenac; Toradol; Metoclopramide; and Tramadol  Home Medications   Current Outpatient Rx  Name  Route  Sig  Dispense  Refill  . albuterol (PROVENTIL HFA;VENTOLIN HFA) 108 (90 BASE) MCG/ACT inhaler   Inhalation   Inhale 2 puffs into the lungs every 6 (six) hours as needed. For shortness of breath         . diazepam (VALIUM) 10 MG tablet   Oral   Take 20 mg by mouth every 6 (six) hours as needed (spasms).         Marland Kitchen HYDROcodone-acetaminophen (NORCO) 10-325 MG per tablet   Oral   Take 2 tablets by mouth every 12 (twelve) hours.         Marland Kitchen OVER THE COUNTER MEDICATION   Oral   Take 1 packet by mouth as needed (when feels like  catching a cold). Vitamin C  Packets         . OVER THE COUNTER MEDICATION   Oral   Take 1 capsule by mouth 2 (two) times daily. Lipozene    Diet pill         . predniSONE (DELTASONE) 20 MG tablet   Oral   Take 20 mg by mouth daily with breakfast.         . promethazine (PHENERGAN) 25 MG tablet   Oral   Take 1 tablet (25 mg total) by mouth every 6 (six) hours as needed for nausea or vomiting.   12 tablet   0    BP 116/78  Pulse 74  Temp(Src) 97.1 F (36.2 C) (Oral)  Resp 19  Ht 5\' 5"  (1.651 m)  Wt 260 lb (117.935 kg)  BMI 43.27 kg/m2  SpO2 99% Physical Exam  Nursing note and vitals reviewed. Constitutional: She appears well-developed and well-nourished. No distress.  Appears well, non-toxic, NAD.  Resting comfortably in exam bed.  HENT:  Head: Normocephalic and atraumatic.  Eyes: Conjunctivae are normal. No scleral icterus.  Neck: Normal range of motion.  Cardiovascular: Normal rate, regular rhythm and normal heart sounds.   Pulmonary/Chest: Effort normal and breath sounds normal. No respiratory distress. She has no wheezes. She has no rales. She exhibits no tenderness.  Abdominal: Soft. Bowel sounds are normal. She exhibits no distension and no mass. There is tenderness. There is no rebound and no guarding.  Obese abdomen, soft, diffuse tenderness, worse in LLQ. No masses, rebound or guarding.  Genitourinary: Uterus normal. No labial fusion. There is no rash, tenderness, lesion or injury on the right labia. There is no rash, tenderness, lesion or injury on the left labia. Cervix exhibits no motion tenderness, no discharge and no friability. Right adnexum displays no mass, no tenderness and no fullness. Left adnexum displays no mass, no tenderness and no fullness. No erythema, tenderness or bleeding around the vagina. No foreign body around the vagina. No signs of injury around the vagina. No vaginal discharge found.  Chaperoned exam. Normal external exam. Internal exam: no vaginal discharge. No CMT, adnexal tenderness or masses.  Musculoskeletal: Normal range of motion.  Neurological: She is alert.  Skin: Skin is warm and dry. She is not diaphoretic.    ED Course  Procedures (including critical care time) Labs Review Labs Reviewed  WET PREP, GENITAL - Abnormal; Notable for the following:    WBC, Wet Prep HPF POC RARE (*)    All other components within normal limits  URINALYSIS, ROUTINE W REFLEX MICROSCOPIC - Abnormal; Notable for the following:    Color, Urine AMBER (*)    Specific Gravity, Urine 1.034 (*)    All other components within normal limits  GC/CHLAMYDIA PROBE AMP  CBC WITH DIFFERENTIAL  COMPREHENSIVE METABOLIC PANEL  LIPASE, BLOOD   Imaging Review No results  found.   EKG Interpretation None      MDM   Final diagnoses:  Abdominal pain  Nausea vomiting and diarrhea    Pt is a 38yo female presenting with n/v/d and abdominal pain.  Pt has had multiple abdominal surgeries including cholecystectomy, appendectomy, and total hysterectomy.  Vitals: WNL. On exam, pt is afebrile, resting comfortably in exam bed, NAD. No gagging or vomiting in ED.  Abd: obese, soft, diffuse tenderness, worse in LLQ. No masses, rebound, or guarding. No CVAT.  Pelvic exam: unremarkable. No vaginal discharge. No CMT, adnexal tenderness or masses. Not concerned for surgical abdomen.  UA: unremarkable CBC, CMP, and Lipase: unremarkable.   Wet prep: unremarkable.  Symptoms likely due to gastroenteritis. Tx in ED: protonix, bentyl, zofran, and IV fluids.  No vomiting in ED. Pt able to keep down several ounces of fluids. Pt states she still has abdominal pain, but abdominal exam is benign.  Pt also c/o HA.  Offered pt acetaminophen for headache, pt declined. Advised pt she needs to rest and keep well hydrated. Will tx symptomatically. Rx: phenergan. Advised to f/u with PCP and GI specialist as needed for continued symptoms. Return precautions provided. Pt verbalized understanding and agreement with tx plan.    Noland Fordyce, PA-C 03/03/14 1002

## 2014-03-03 NOTE — ED Provider Notes (Signed)
Medical screening examination/treatment/procedure(s) were performed by non-physician practitioner and as supervising physician I was immediately available for consultation/collaboration.   EKG Interpretation None        Blanchie Dessert, MD 03/03/14 1004

## 2014-03-03 NOTE — ED Notes (Signed)
She is unhappy, and is a bit grim in her demeanor.  She states "I just don't understand why I don't feel better-and why you let people go home that don't feel better".  I re-iterate that all lab work was excellent, as are her v.s.  In an odd move, she suddenly takes out her own IV; which I then help her cleanse and dress.  She leaves ambulatory with her son.  I thank her for letting us care for her, and she, in turn thanks me.

## 2014-03-03 NOTE — ED Notes (Signed)
Pt unable to urinate at this time.  

## 2014-03-03 NOTE — ED Notes (Signed)
PA at bedside.

## 2014-03-03 NOTE — ED Notes (Signed)
She states she feels "the same-no better".  Junie Panning, our P.A. Has a lengthy conversation with her in which she tells her that she feels it is a virus, and moreover, the virus is most likely to leave her system very soon, as she has had no vomiting or diarrhea x >10 hours.  Pt. Has the demeanor that she is not pleased, however, she is in no distress; and has no trouble completing her ADL's.

## 2014-03-03 NOTE — ED Notes (Signed)
She is in no distress.  I have just medicated her and our plan is to check in ~15 min. To see if she feels better.  An adult female is with her.

## 2014-03-03 NOTE — ED Notes (Addendum)
Pt c/o LLQ abd pain with n/v/d x 2 days. Pt denies GYN s/s, pt denies urinary s/s

## 2014-03-05 LAB — GC/CHLAMYDIA PROBE AMP
CT Probe RNA: NEGATIVE
GC Probe RNA: NEGATIVE

## 2014-04-04 ENCOUNTER — Emergency Department (HOSPITAL_COMMUNITY)
Admission: EM | Admit: 2014-04-04 | Discharge: 2014-04-04 | Disposition: A | Discharge status: Home / Self Care | Payer: BC Managed Care – PPO | Attending: Emergency Medicine | Admitting: Emergency Medicine

## 2014-04-04 ENCOUNTER — Encounter (HOSPITAL_COMMUNITY): Payer: Self-pay | Admitting: Emergency Medicine

## 2014-04-04 DIAGNOSIS — J45909 Unspecified asthma, uncomplicated: Secondary | ICD-10-CM | POA: Insufficient documentation

## 2014-04-04 DIAGNOSIS — R609 Edema, unspecified: Secondary | ICD-10-CM | POA: Insufficient documentation

## 2014-04-04 DIAGNOSIS — Z79899 Other long term (current) drug therapy: Secondary | ICD-10-CM | POA: Insufficient documentation

## 2014-04-04 DIAGNOSIS — M069 Rheumatoid arthritis, unspecified: Secondary | ICD-10-CM | POA: Insufficient documentation

## 2014-04-04 DIAGNOSIS — Z8719 Personal history of other diseases of the digestive system: Secondary | ICD-10-CM | POA: Insufficient documentation

## 2014-04-04 DIAGNOSIS — Z3202 Encounter for pregnancy test, result negative: Secondary | ICD-10-CM | POA: Insufficient documentation

## 2014-04-04 HISTORY — DX: Irritable bowel syndrome without diarrhea: K58.9

## 2014-04-04 LAB — URINALYSIS, ROUTINE W REFLEX MICROSCOPIC
Bilirubin Urine: NEGATIVE
Glucose, UA: NEGATIVE mg/dL
Hgb urine dipstick: NEGATIVE
Ketones, ur: NEGATIVE mg/dL
Leukocytes, UA: NEGATIVE
Nitrite: NEGATIVE
Protein, ur: NEGATIVE mg/dL
Specific Gravity, Urine: 1.025 (ref 1.005–1.030)
Urobilinogen, UA: 1 mg/dL (ref 0.0–1.0)
pH: 6 (ref 5.0–8.0)

## 2014-04-04 LAB — CBC WITH DIFFERENTIAL/PLATELET
Basophils Absolute: 0 10*3/uL (ref 0.0–0.1)
Basophils Relative: 0 % (ref 0–1)
Eosinophils Absolute: 0.2 10*3/uL (ref 0.0–0.7)
Eosinophils Relative: 3 % (ref 0–5)
HCT: 36.2 % (ref 36.0–46.0)
Hemoglobin: 11.8 g/dL — ABNORMAL LOW (ref 12.0–15.0)
Lymphocytes Relative: 26 % (ref 12–46)
Lymphs Abs: 1.6 10*3/uL (ref 0.7–4.0)
MCH: 29 pg (ref 26.0–34.0)
MCHC: 32.6 g/dL (ref 30.0–36.0)
MCV: 88.9 fL (ref 78.0–100.0)
Monocytes Absolute: 0.3 10*3/uL (ref 0.1–1.0)
Monocytes Relative: 5 % (ref 3–12)
Neutro Abs: 4 10*3/uL (ref 1.7–7.7)
Neutrophils Relative %: 66 % (ref 43–77)
Platelets: 303 10*3/uL (ref 150–400)
RBC: 4.07 MIL/uL (ref 3.87–5.11)
RDW: 13.3 % (ref 11.5–15.5)
WBC: 6.1 10*3/uL (ref 4.0–10.5)

## 2014-04-04 LAB — COMPREHENSIVE METABOLIC PANEL
ALT: 13 U/L (ref 0–35)
AST: 12 U/L (ref 0–37)
Albumin: 3.6 g/dL (ref 3.5–5.2)
Alkaline Phosphatase: 96 U/L (ref 39–117)
BUN: 13 mg/dL (ref 6–23)
CO2: 26 mEq/L (ref 19–32)
Calcium: 9.8 mg/dL (ref 8.4–10.5)
Chloride: 102 mEq/L (ref 96–112)
Creatinine, Ser: 0.75 mg/dL (ref 0.50–1.10)
GFR calc Af Amer: >90 mL/min (ref >90)
GFR calc non Af Amer: >90 mL/min (ref >90)
Glucose, Bld: 101 mg/dL — ABNORMAL HIGH (ref 70–99)
Potassium: 4.5 mEq/L (ref 3.7–5.3)
Sodium: 139 mEq/L (ref 137–147)
Total Bilirubin: 0.3 mg/dL (ref 0.3–1.2)
Total Protein: 7.5 g/dL (ref 6.0–8.3)

## 2014-04-04 LAB — PREGNANCY, URINE: Preg Test, Ur: NEGATIVE

## 2014-04-04 MED ORDER — POTASSIUM CHLORIDE CRYS ER 20 MEQ PO TBCR
20.0000 meq | EXTENDED_RELEASE_TABLET | Freq: Once | ORAL | Status: AC
  Administered 2014-04-04: 20 meq via ORAL
  Filled 2014-04-04: qty 1

## 2014-04-04 MED ORDER — OXYCODONE-ACETAMINOPHEN 5-325 MG PO TABS
2.0000 | ORAL_TABLET | Freq: Once | ORAL | Status: AC
  Administered 2014-04-04: 2 via ORAL
  Filled 2014-04-04: qty 2

## 2014-04-04 MED ORDER — FUROSEMIDE 20 MG PO TABS: 20.0000 mg | ORAL_TABLET | Freq: Every day | ORAL | Status: DC

## 2014-04-04 MED ORDER — FUROSEMIDE 40 MG PO TABS
40.0000 mg | ORAL_TABLET | Freq: Once | ORAL | Status: AC
  Administered 2014-04-04: 40 mg via ORAL
  Filled 2014-04-04: qty 1

## 2014-04-04 NOTE — ED Notes (Signed)
Pt alert and oriented x4. Respirations even and unlabored, bilateral symmetrical rise and fall of chest. Skin warm and dry. In no acute distress. Denies needs.   

## 2014-04-04 NOTE — Discharge Instructions (Signed)

## 2014-04-04 NOTE — ED Notes (Signed)
Pt c/o bilateral leg swelling x 2 days. Pt states she has swelling behind her knees down to her feet. Pt also states she has swelling and bloating to her abdomen area. Pt denies n/v/d. Pt ambulatory to exam room with steady gait. No acute distress. Skin warm and dry.

## 2014-04-10 NOTE — ED Provider Notes (Signed)
CSN: [id number]     Arrival date & time 04/04/14  1735 History   First MD Initiated Contact with Patient 04/04/14 1810     Chief Complaint  Patient presents with  . Bloated  . Leg Swelling     (Consider location/radiation/quality/duration/timing/severity/associated sxs/prior Treatment) HPI  38 year old female with lower extremity swelling. Onset about 2-3 days ago and she has progressively been worsening. She's had these symptoms in the past intermittently. She has no respiratory complaints. She feels that he's been urinating normally. No fevers or chills. No dizziness or lightheadedness. He should changes. No history of DVT/PE. No intervention prior to arrival.  Past Medical History  Diagnosis Date  . Asthma     diagnosed as  a young adult   . Rheumatoid arthritis(714.0)   . IBS (irritable bowel syndrome)    Past Surgical History  Procedure Laterality Date  . Hernia repair    . Abdominal hysterectomy    . Cystectomy    . Cholecystectomy    . Tubal ligation    . Breast reduction surgery    . Appendectomy     Family History  Problem Relation Age of Onset  . Rheum arthritis Father   . Breast cancer Father     died  . Lupus Maternal Aunt   . Rheum arthritis Mother   . Rheum arthritis Maternal Grandmother   . Rheum arthritis Maternal Grandfather   . Rheum arthritis Paternal Grandfather   . Rheum arthritis Paternal Grandmother    History  Substance Use Topics  . Smoking status: Never Smoker   . Smokeless tobacco: Never Used  . Alcohol Use: No   OB History   Grav Para Term Preterm Abortions TAB SAB Ect Mult Living                 Review of Systems  All systems reviewed and negative, other than as noted in HPI.   Allergies  Bee venom; Diclofenac; Toradol; Metoclopramide; and Tramadol  Home Medications   Prior to Admission medications   Medication Sig Start Date End Date Taking? Authorizing Provider  albuterol (PROVENTIL HFA;VENTOLIN HFA) 108 (90 BASE)  MCG/ACT inhaler Inhale 2 puffs into the lungs every 6 (six) hours as needed. For shortness of breath   Yes Historical Provider, MD  diazepam (VALIUM) 10 MG tablet Take 20 mg by mouth every 6 (six) hours as needed (spasms).   Yes Historical Provider, MD  HYDROcodone-acetaminophen (NORCO) 10-325 MG per tablet Take 2 tablets by mouth every 12 (twelve) hours.   Yes Historical Provider, MD  furosemide (LASIX) 20 MG tablet Take 1 tablet (20 mg total) by mouth daily. 04/04/14   Virgel Manifold, MD   BP 136/58  Pulse 77  Temp(Src) 98.4 F (36.9 C) (Oral)  Resp 16  SpO2 100% Physical Exam  Nursing note and vitals reviewed. Constitutional: No distress.  Laying in bed. No acute distress. Obese.  HENT:  Head: Normocephalic and atraumatic.  Eyes: Conjunctivae are normal. Right eye exhibits no discharge. Left eye exhibits no discharge.  Neck: Neck supple.  Cardiovascular: Normal rate, regular rhythm and normal heart sounds.  Exam reveals no gallop and no friction rub.   No murmur heard. Pulmonary/Chest: Effort normal and breath sounds normal. No respiratory distress.  Abdominal: Soft. She exhibits no distension. There is no tenderness.  Musculoskeletal: She exhibits edema. She exhibits no tenderness.  Lower extremities symmetric as compared to each other. No calf tenderness. Negative Homan's. No palpable cords.   Neurological: She is  alert.  Skin: Skin is warm and dry.  Psychiatric: She has a normal mood and affect. Her behavior is normal. Thought content normal.    ED Course  Procedures (including critical care time) Labs Review Labs Reviewed  COMPREHENSIVE METABOLIC PANEL - Abnormal; Notable for the following:    Glucose, Bld 101 (*)    All other components within normal limits  CBC WITH DIFFERENTIAL - Abnormal; Notable for the following:    Hemoglobin 11.8 (*)    All other components within normal limits  URINALYSIS, ROUTINE W REFLEX MICROSCOPIC  PREGNANCY, URINE    Imaging Review No  results found.   EKG Interpretation None      MDM   Final diagnoses:  Peripheral edema    38 year old female with some peripheral edema. No respiratory complaints. Her renal function is fine. No history of hepatic dysfunction and in CMP is unremarkable. We'll give a short course of diuretic. Low suspicion for venous thrombosis or other potential emergent etiology of her symptoms. Return precautions were discussed. Outpatient followup otherwise.   Virgel Manifold, MD 04/10/14 1407

## 2014-06-11 ENCOUNTER — Emergency Department (HOSPITAL_COMMUNITY)
Admission: EM | Admit: 2014-06-11 | Discharge: 2014-06-11 | Disposition: A | Discharge status: Home / Self Care | Payer: BC Managed Care – PPO | Attending: Emergency Medicine | Admitting: Emergency Medicine

## 2014-06-11 ENCOUNTER — Encounter (HOSPITAL_COMMUNITY): Payer: Self-pay | Admitting: Emergency Medicine

## 2014-06-11 ENCOUNTER — Emergency Department (HOSPITAL_COMMUNITY): Payer: BC Managed Care – PPO

## 2014-06-11 DIAGNOSIS — Z8719 Personal history of other diseases of the digestive system: Secondary | ICD-10-CM | POA: Insufficient documentation

## 2014-06-11 DIAGNOSIS — G8929 Other chronic pain: Secondary | ICD-10-CM

## 2014-06-11 DIAGNOSIS — Z9851 Tubal ligation status: Secondary | ICD-10-CM | POA: Insufficient documentation

## 2014-06-11 DIAGNOSIS — Z90710 Acquired absence of both cervix and uterus: Secondary | ICD-10-CM | POA: Insufficient documentation

## 2014-06-11 DIAGNOSIS — R197 Diarrhea, unspecified: Secondary | ICD-10-CM | POA: Insufficient documentation

## 2014-06-11 DIAGNOSIS — Z79899 Other long term (current) drug therapy: Secondary | ICD-10-CM | POA: Insufficient documentation

## 2014-06-11 DIAGNOSIS — M069 Rheumatoid arthritis, unspecified: Secondary | ICD-10-CM | POA: Insufficient documentation

## 2014-06-11 DIAGNOSIS — R112 Nausea with vomiting, unspecified: Secondary | ICD-10-CM

## 2014-06-11 DIAGNOSIS — Z9089 Acquired absence of other organs: Secondary | ICD-10-CM | POA: Insufficient documentation

## 2014-06-11 DIAGNOSIS — J45909 Unspecified asthma, uncomplicated: Secondary | ICD-10-CM | POA: Insufficient documentation

## 2014-06-11 DIAGNOSIS — R109 Unspecified abdominal pain: Secondary | ICD-10-CM

## 2014-06-11 DIAGNOSIS — R1084 Generalized abdominal pain: Secondary | ICD-10-CM | POA: Insufficient documentation

## 2014-06-11 LAB — URINALYSIS, ROUTINE W REFLEX MICROSCOPIC
BILIRUBIN URINE: NEGATIVE
Glucose, UA: NEGATIVE mg/dL
Hgb urine dipstick: NEGATIVE
Ketones, ur: NEGATIVE mg/dL
Leukocytes, UA: NEGATIVE
Nitrite: NEGATIVE
PROTEIN: NEGATIVE mg/dL
SPECIFIC GRAVITY, URINE: 1.024 (ref 1.005–1.030)
UROBILINOGEN UA: 1 mg/dL (ref 0.0–1.0)
pH: 5.5 (ref 5.0–8.0)

## 2014-06-11 LAB — CBC WITH DIFFERENTIAL/PLATELET
BASOS ABS: 0 10*3/uL (ref 0.0–0.1)
BASOS PCT: 0 % (ref 0–1)
EOS ABS: 0.3 10*3/uL (ref 0.0–0.7)
Eosinophils Relative: 5 % (ref 0–5)
HCT: 38.5 % (ref 36.0–46.0)
Hemoglobin: 12.6 g/dL (ref 12.0–15.0)
Lymphocytes Relative: 37 % (ref 12–46)
Lymphs Abs: 2.1 10*3/uL (ref 0.7–4.0)
MCH: 29.2 pg (ref 26.0–34.0)
MCHC: 32.7 g/dL (ref 30.0–36.0)
MCV: 89.1 fL (ref 78.0–100.0)
MONOS PCT: 4 % (ref 3–12)
Monocytes Absolute: 0.3 10*3/uL (ref 0.1–1.0)
NEUTROS ABS: 3.1 10*3/uL (ref 1.7–7.7)
NEUTROS PCT: 54 % (ref 43–77)
PLATELETS: 294 10*3/uL (ref 150–400)
RBC: 4.32 MIL/uL (ref 3.87–5.11)
RDW: 13.6 % (ref 11.5–15.5)
WBC: 5.7 10*3/uL (ref 4.0–10.5)

## 2014-06-11 LAB — BASIC METABOLIC PANEL
BUN: 9 mg/dL (ref 6–23)
CALCIUM: 9.6 mg/dL (ref 8.4–10.5)
CO2: 27 mEq/L (ref 19–32)
Chloride: 98 mEq/L (ref 96–112)
Creatinine, Ser: 0.67 mg/dL (ref 0.50–1.10)
Glucose, Bld: 93 mg/dL (ref 70–99)
Potassium: 4.1 mEq/L (ref 3.7–5.3)
SODIUM: 136 meq/L — AB (ref 137–147)

## 2014-06-11 MED ORDER — DICYCLOMINE HCL 20 MG PO TABS
20.0000 mg | ORAL_TABLET | Freq: Once | ORAL | Status: AC
  Administered 2014-06-11: 20 mg via ORAL
  Filled 2014-06-11: qty 1

## 2014-06-11 MED ORDER — ONDANSETRON 4 MG PO TBDP: ORAL_TABLET | ORAL | Status: DC

## 2014-06-11 MED ORDER — DICYCLOMINE HCL 20 MG PO TABS: 20.0000 mg | ORAL_TABLET | Freq: Two times a day (BID) | ORAL | Status: DC

## 2014-06-11 MED ORDER — OXYCODONE-ACETAMINOPHEN 5-325 MG PO TABS
2.0000 | ORAL_TABLET | Freq: Once | ORAL | Status: AC
  Administered 2014-06-11: 2 via ORAL
  Filled 2014-06-11: qty 2

## 2014-06-11 MED ORDER — HYDROCODONE-ACETAMINOPHEN 5-325 MG PO TABS: 1.0000 | ORAL_TABLET | ORAL | Status: DC | PRN

## 2014-06-11 MED ORDER — ONDANSETRON 8 MG PO TBDP
8.0000 mg | ORAL_TABLET | Freq: Once | ORAL | Status: AC
  Administered 2014-06-11: 8 mg via ORAL
  Filled 2014-06-11: qty 1

## 2014-06-11 NOTE — ED Provider Notes (Signed)
Medical screening examination/treatment/procedure(s) were performed by non-physician practitioner and as supervising physician I was immediately available for consultation/collaboration.   EKG Interpretation None       Kalman Drape, MD 06/11/14 [phone]

## 2014-06-11 NOTE — Discharge Instructions (Signed)
Your laboratory testing and x-rays have not shown any signs for an emergent condition. Please followup with a primary care provider, general surgeon or GI specialist for continued evaluation and treatment of your ongoing symptoms.    Abdominal Pain Many things can cause abdominal pain. Usually, abdominal pain is not caused by a disease and will improve without treatment. It can often be observed and treated at home. Your health care provider will do a physical exam and possibly order blood tests and X-rays to help determine the seriousness of your pain. However, in many cases, more time must pass before a clear cause of the pain can be found. Before that point, your health care provider may not know if you need more testing or further treatment. HOME CARE INSTRUCTIONS  Monitor your abdominal pain for any changes. The following actions may help to alleviate any discomfort you are experiencing:  Only take over-the-counter or prescription medicines as directed by your health care provider.  Do not take laxatives unless directed to do so by your health care provider.  Try a clear liquid diet (broth, tea, or water) as directed by your health care provider. Slowly move to a bland diet as tolerated. SEEK MEDICAL CARE IF:  You have unexplained abdominal pain.  You have abdominal pain associated with nausea or diarrhea.  You have pain when you urinate or have a bowel movement.  You experience abdominal pain that wakes you in the night.  You have abdominal pain that is worsened or improved by eating food.  You have abdominal pain that is worsened with eating fatty foods.  You have a fever. SEEK IMMEDIATE MEDICAL CARE IF:   Your pain does not go away within 2 hours.  You keep throwing up (vomiting).  Your pain is felt only in portions of the abdomen, such as the right side or the left lower portion of the abdomen.  You pass bloody or black tarry stools. MAKE SURE YOU:  Understand these  instructions.   Will watch your condition.   Will get help right away if you are not doing well or get worse.  Document Released: 09/09/2005 Document Revised: 12/05/2013 Document Reviewed: 08/09/2013 [facility] Patient Information 2015 [place], [place]. This information is not intended to replace advice given to you by your health care provider. Make sure you discuss any questions you have with your health care provider.    Chronic Diarrhea Diarrhea is frequent loose and watery bowel movements. It can cause you to feel weak and dehydrated. Dehydration can cause you to become tired and thirsty and to have a dry mouth, decreased urination, and dark yellow urine. Diarrhea is a sign of another problem, most often an infection that will not last long. In most cases, diarrhea lasts 2-3 days. Diarrhea that lasts longer than 4 weeks is called long-lasting (chronic) diarrhea. It is important to treat your diarrhea as directed by your health care provider to lessen or prevent future episodes of diarrhea.  CAUSES  There are many causes of chronic diarrhea. The following are some possible causes:   Gastrointestinal infections caused by viruses, bacteria, or parasites.   Food poisoning or food allergies.   Certain medicines, such as antibiotics, chemotherapy, and laxatives.   Artificial sweeteners and fructose.   Digestive disorders, such as celiac disease and inflammatory bowel diseases.   Irritable bowel syndrome.  Some disorders of the pancreas.  Disorders of the thyroid.  Reduced blood flow to the intestines.  Cancer. Sometimes the cause of chronic diarrhea  is unknown. RISK FACTORS  Having a severely weakened immune system, such as from HIV or AIDS.   Taking certain types of cancer-fighting drugs (such as with chemotherapy) or other medicines.   Having had a recent organ transplant.   Having a portion of the stomach or small bowel removed.   Traveling to countries where  food and water supplies are often contaminated.  SYMPTOMS  In addition to frequent, loose stools, diarrhea may cause:   Cramping.   Abdominal pain.   Nausea.   Fever.  Fatigue.  Urgent need to use the bathroom.  Loss of bowel control. DIAGNOSIS  Your health care provider must take a careful history and perform a physical exam. Tests given are based on your symptoms and history. Tests may include:   Blood or stool tests. Three or more stool samples may be examined. Stool cultures may be used to test for bacteria or parasites.   X-rays.   A procedure in which a thin tube is inserted into the mouth or rectum (endoscopy). This allows the health care provider to look inside the intestine.  TREATMENT   Treatment is aimed at correcting the cause of the diarrhea when possible.  Diarrhea caused by an infection can often be treated with antibiotic medicines.  Diarrhea not caused by an infection may require you to take long-term medicine or have surgery. Specific treatment should be discussed with your health care provider.  If the cause cannot be determined, treatment aims to relieve symptoms and prevent dehydration. Serious health problems can occur if you do not maintain proper fluid levels. Treatment may include:  Taking an oral rehydration solution (ORS).  Not drinking beverages that contain caffeine (such as tea, coffee, and soft drinks).  Not drinking alcohol.  Maintaining well-balanced nutrition to help you recover faster. HOME CARE INSTRUCTIONS   Drink enough fluids to keep urine clear or pale yellow. Drink 1 cup (8 oz) of fluid for each diarrhea episode. Avoid fluids that contain simple sugars, fruit juices, whole milk products, and sodas. Hydrate with an ORS. You may purchase the ORS or prepare it at home by mixing the following ingredients together:   - tsp (1.7-3  mL) table salt.   tsp (3  mL) baking soda.   tsp (1.7 mL) salt substitute containing  potassium chloride.  1 tbsp (20 mL) sugar.  4.2 c (1 L) of water.   Certain foods and beverages may increase the speed at which food moves through the gastrointestinal (GI) tract. These foods and beverages should be avoided. They include:  Caffeinated and alcoholic beverages.  High-fiber foods, such as raw fruits and vegetables, nuts, seeds, and whole grain breads and cereals.  Foods and beverages sweetened with sugar alcohols, such as xylitol, sorbitol, and mannitol.   Some foods may be well tolerated and may help thicken stool. These include:  Starchy foods, such as rice, toast, pasta, low-sugar cereal, oatmeal, grits, baked potatoes, crackers, and bagels.  Bananas.  Applesauce.  Add probiotic-rich foods to help increase healthy bacteria in the GI tract. These include yogurt and fermented milk products.  Wash your hands well after each diarrhea episode.  Only take over-the-counter or prescription medicines as directed by your health care provider.  Take a warm bath to relieve any burning or pain from frequent diarrhea episodes. SEEK MEDICAL CARE IF:   You are not urinating as often.  Your urine is a dark color.  You become very tired or dizzy.  You have severe pain in  the abdomen or rectum.  Your have blood or pus in your stools.  Your stools look black and tarry. SEEK IMMEDIATE MEDICAL CARE IF:   You are unable to keep fluids down.  You have persistent vomiting.  You have blood in your stool.  Your stools are black and tarry.  You do not urinate in 6-8 hours, or there is only a small amount of very dark urine.  You have abdominal pain that increases or localizes.  You have weakness, dizziness, confusion, or lightheadedness.  You have a severe headache.  Your diarrhea gets worse or does not get better.  You have a fever or persistent symptoms for more than 2-3 days.  You have a fever and your symptoms suddenly get worse. MAKE SURE YOU:    Understand these instructions.  Will watch your condition.  Will get help right away if you are not doing well or get worse. Document Released: 02/20/2004 Document Revised: 12/05/2013 Document Reviewed: 05/25/2013 [facility] Patient Information 2015 [place], [place]. This information is not intended to replace advice given to you by your health care provider. Make sure you discuss any questions you have with your health care provider.

## 2014-06-11 NOTE — ED Provider Notes (Signed)
CSN: [id number]     Arrival date & time 06/11/14  0140 History   First MD Initiated Contact with Patient 06/11/14 0501     Chief Complaint  Patient presents with  . Abdominal Pain   HPI  History provided by patient. Patient is a 38 year old female with history of asthma, rheumatoid arthritis, girdle bowel syndrome, abdominal hysterectomy, cholecystectomy, appendectomy and hernia repair who presents with complaints of chronic abdominal pain, nausea, vomiting and diarrhea. Patient states she's had problems with abdominal pains and cramps for the past several years. This is occasionally accompanied with nausea, vomiting and diarrhea. She reports being worked up for these symptoms in the past by a Education officer, environmental, GI specialist and primary care providers. She states she was told this may be adhesions from surgeries and was planning to have first adhesions removed at one point but lost her insurance. Over the past week symptoms have been especially bad. She states she is not been able to eat or drink much without either vomiting or having diarrhea. She usually has abdominal cramps in the right lower side or in the left middle or upper side. She has taken Tylenol with some temporary relief of symptoms. No associated fever, chills or sweats.    Past Medical History  Diagnosis Date  . Asthma     diagnosed as  a young adult   . Rheumatoid arthritis(714.0)   . IBS (irritable bowel syndrome)    Past Surgical History  Procedure Laterality Date  . Hernia repair    . Abdominal hysterectomy    . Cystectomy    . Cholecystectomy    . Tubal ligation    . Breast reduction surgery    . Appendectomy     Family History  Problem Relation Age of Onset  . Rheum arthritis Father   . Breast cancer Father     died  . Lupus Maternal Aunt   . Rheum arthritis Mother   . Rheum arthritis Maternal Grandmother   . Rheum arthritis Maternal Grandfather   . Rheum arthritis Paternal Grandfather   . Rheum arthritis  Paternal Grandmother    History  Substance Use Topics  . Smoking status: Never Smoker   . Smokeless tobacco: Never Used  . Alcohol Use: No   OB History   Grav Para Term Preterm Abortions TAB SAB Ect Mult Living                 Review of Systems  Constitutional: Negative for fever, chills and diaphoresis.  Respiratory: Negative for shortness of breath.   Cardiovascular: Negative for chest pain.  Gastrointestinal: Positive for nausea, vomiting, abdominal pain and diarrhea. Negative for constipation and blood in stool.  Genitourinary: Negative for dysuria, frequency, hematuria, flank pain, vaginal bleeding and vaginal discharge.  All other systems reviewed and are negative.     Allergies  Bee venom; Diclofenac; Toradol; Metoclopramide; and Tramadol  Home Medications   Prior to Admission medications   Medication Sig Start Date End Date Taking? Authorizing Provider  acetaminophen (TYLENOL) 500 MG tablet Take 1,000 mg by mouth every 6 (six) hours as needed (for pain.).   Yes Historical Provider, MD  albuterol (PROVENTIL HFA;VENTOLIN HFA) 108 (90 BASE) MCG/ACT inhaler Inhale 2 puffs into the lungs every 6 (six) hours as needed. For shortness of breath   Yes Historical Provider, MD  Multiple Vitamins-Minerals (EMERGEN-C IMMUNE PLUS) PACK Take 1 packet by mouth once.   Yes Historical Provider, MD   BP 133/75  Pulse 73  Temp(Src) 98.5 F (36.9 C) (Oral)  Resp 20  Ht 5\' 5"  (1.651 m)  Wt 260 lb (117.935 kg)  BMI 43.27 kg/m2  SpO2 100% Physical Exam  Nursing note and vitals reviewed. Constitutional: She is oriented to person, place, and time. She appears well-developed and well-nourished. No distress.  HENT:  Head: Normocephalic.  Cardiovascular: Normal rate and regular rhythm.   Pulmonary/Chest: Effort normal and breath sounds normal. No respiratory distress. She has no wheezes. She has no rales.  Abdominal: Soft. She exhibits no distension. There is tenderness. There is no  rebound and no guarding.  Mild diffuse tenderness. No significant focal pain. No peritoneal signs.  Neurological: She is alert and oriented to person, place, and time.  Skin: Skin is warm and dry. No rash noted.  Psychiatric: She has a normal mood and affect. Her behavior is normal.    ED Course  Procedures   COORDINATION OF CARE:  Nursing notes reviewed. Vital signs reviewed. Initial pt interview and examination performed.   Filed Vitals:   06/11/14 0150  BP: 133/75  Pulse: 73  Temp: 98.5 F (36.9 C)  TempSrc: Oral  Resp: 20  Height: 5\' 5"  (1.651 m)  Weight: 260 lb (117.935 kg)  SpO2: 100%    5:10 AM-patient seen and evaluated. Patient appears well does not appear in significant pain or discomfort. No significant abdominal distention. No significant clinical findings for small bowel obstruction. Normal unremarkable laboratory testing. At this time there does not appear to be any emergent cause for her symptoms. Will order acute abdominal series to further rule out SBO. If normal patient will be given referrals for general surgery, GI and PCP.  X-ray unremarkable. At this time patient may return home and followup with specialists for continued evaluation of her chronic symptoms.     Treatment plan initiated: Medications  oxyCODONE-acetaminophen (PERCOCET/ROXICET) 5-325 MG per tablet 2 tablet (not administered)  ondansetron (ZOFRAN-ODT) disintegrating tablet 8 mg (not administered)      Results for orders placed during the hospital encounter of 06/11/14  URINALYSIS, ROUTINE W REFLEX MICROSCOPIC      Result Value Ref Range   Color, Urine YELLOW  YELLOW   APPearance CLEAR  CLEAR   Specific Gravity, Urine 1.024  1.005 - 1.030   pH 5.5  5.0 - 8.0   Glucose, UA NEGATIVE  NEGATIVE mg/dL   Hgb urine dipstick NEGATIVE  NEGATIVE   Bilirubin Urine NEGATIVE  NEGATIVE   Ketones, ur NEGATIVE  NEGATIVE mg/dL   Protein, ur NEGATIVE  NEGATIVE mg/dL   Urobilinogen, UA 1.0  0.0 -  1.0 mg/dL   Nitrite NEGATIVE  NEGATIVE   Leukocytes, UA NEGATIVE  NEGATIVE  BASIC METABOLIC PANEL      Result Value Ref Range   Sodium 136 (*) 137 - 147 mEq/L   Potassium 4.1  3.7 - 5.3 mEq/L   Chloride 98  96 - 112 mEq/L   CO2 27  19 - 32 mEq/L   Glucose, Bld 93  70 - 99 mg/dL   BUN 9  6 - 23 mg/dL   Creatinine, Ser 0.67  0.50 - 1.10 mg/dL   Calcium 9.6  8.4 - 10.5 mg/dL   GFR calc non Af Amer >90  >90 mL/min   GFR calc Af Amer >90  >90 mL/min  CBC WITH DIFFERENTIAL      Result Value Ref Range   WBC 5.7  4.0 - 10.5 K/uL   RBC 4.32  3.87 - 5.11 MIL/uL  Hemoglobin 12.6  12.0 - 15.0 g/dL   HCT 38.5  36.0 - 46.0 %   MCV 89.1  78.0 - 100.0 fL   MCH 29.2  26.0 - 34.0 pg   MCHC 32.7  30.0 - 36.0 g/dL   RDW 13.6  11.5 - 15.5 %   Platelets 294  150 - 400 K/uL   Neutrophils Relative % 54  43 - 77 %   Neutro Abs 3.1  1.7 - 7.7 K/uL   Lymphocytes Relative 37  12 - 46 %   Lymphs Abs 2.1  0.7 - 4.0 K/uL   Monocytes Relative 4  3 - 12 %   Monocytes Absolute 0.3  0.1 - 1.0 K/uL   Eosinophils Relative 5  0 - 5 %   Eosinophils Absolute 0.3  0.0 - 0.7 K/uL   Basophils Relative 0  0 - 1 %   Basophils Absolute 0.0  0.0 - 0.1 K/uL      Imaging Review Dg Abd Acute W/chest  06/11/2014   CLINICAL DATA:  Rule out small bowel obstruction.  EXAM: ACUTE ABDOMEN SERIES (ABDOMEN 2 VIEW & CHEST 1 VIEW)  COMPARISON:  11/09/2013  FINDINGS: Moderate stool volume but no definitive constipation. There is no evidence of dilated bowel loops or free intraperitoneal air. No radiopaque calculi or other significant radiographic abnormality is seen. Cholecystectomy clips are noted. Heart size and mediastinal contours are within normal limits. Both lungs are clear.  IMPRESSION: Negative abdominal radiographs.  No acute cardiopulmonary disease.   Electronically Signed   By: Jorje Guild M.D.   On: 06/11/2014 05:51     MDM   Final diagnoses:  Chronic abdominal pain  Nausea vomiting and diarrhea        Martie Lee, PA-C 06/11/14 [id number]

## 2014-06-11 NOTE — ED Notes (Signed)
Pt complains of abd pain for one week, vomiting and diarrhea

## 2014-09-10 ENCOUNTER — Emergency Department (INDEPENDENT_AMBULATORY_CARE_PROVIDER_SITE_OTHER)
Admission: EM | Admit: 2014-09-10 | Discharge: 2014-09-10 | Disposition: A | Discharge status: Home / Self Care | Payer: BC Managed Care – PPO | Source: Home / Self Care | Attending: Family Medicine | Admitting: Family Medicine

## 2014-09-10 ENCOUNTER — Encounter (HOSPITAL_COMMUNITY): Payer: Self-pay | Admitting: Emergency Medicine

## 2014-09-10 DIAGNOSIS — R51 Headache: Secondary | ICD-10-CM

## 2014-09-10 DIAGNOSIS — R519 Headache, unspecified: Secondary | ICD-10-CM

## 2014-09-10 MED ORDER — TRAZODONE HCL 100 MG PO TABS: 100.0000 mg | ORAL_TABLET | Freq: Every evening | ORAL | Status: DC | PRN

## 2014-09-10 NOTE — Discharge Instructions (Signed)
Use medicine as prescribed, see your doctor for recheck.

## 2014-09-10 NOTE — ED Provider Notes (Addendum)
CSN: [id number]     Arrival date & time 09/10/14  1842 History   None    Chief Complaint  Patient presents with  . Headache   (Consider location/radiation/quality/duration/timing/severity/associated sxs/prior Treatment) Patient is a 38 y.o. female presenting with headaches. The history is provided by the patient.  Headache Pain location:  Occipital Quality:  Dull Radiates to:  Does not radiate Onset quality:  Gradual Duration:  3 weeks Progression:  Unchanged Chronicity:  New Similar to prior headaches: no   Context: emotional stress   Context: not activity, not exposure to bright light, not caffeine, not coughing, not defecating, not eating and not exposure to cold air   Relieved by:  None tried Worsened by:  Nothing tried Ineffective treatments:  None tried Associated symptoms: no back pain, no blurred vision, no dizziness, no fever, no nausea, no numbness, no photophobia, no sinus pressure and no weakness   Risk factors: insomnia     Past Medical History  Diagnosis Date  . Asthma     diagnosed as  a young adult   . Rheumatoid arthritis(714.0)   . IBS (irritable bowel syndrome)    Past Surgical History  Procedure Laterality Date  . Hernia repair    . Abdominal hysterectomy    . Cystectomy    . Cholecystectomy    . Tubal ligation    . Breast reduction surgery    . Appendectomy     Family History  Problem Relation Age of Onset  . Rheum arthritis Father   . Breast cancer Father     died  . Lupus Maternal Aunt   . Rheum arthritis Mother   . Rheum arthritis Maternal Grandmother   . Rheum arthritis Maternal Grandfather   . Rheum arthritis Paternal Grandfather   . Rheum arthritis Paternal Grandmother    History  Substance Use Topics  . Smoking status: Never Smoker   . Smokeless tobacco: Never Used  . Alcohol Use: No   OB History   Grav Para Term Preterm Abortions TAB SAB Ect Mult Living                 Review of Systems  Constitutional: Negative.   Negative for fever.  HENT: Negative for sinus pressure.   Eyes: Negative.  Negative for blurred vision and photophobia.  Gastrointestinal: Negative for nausea.  Genitourinary: Negative.   Musculoskeletal: Negative for back pain.  Neurological: Positive for headaches. Negative for dizziness and numbness.    Allergies  Bee venom; Diclofenac; Toradol; Metoclopramide; and Tramadol  Home Medications   Prior to Admission medications   Medication Sig Start Date End Date Taking? Authorizing Provider  acetaminophen (TYLENOL) 500 MG tablet Take 1,000 mg by mouth every 6 (six) hours as needed (for pain.).    Historical Provider, MD  albuterol (PROVENTIL HFA;VENTOLIN HFA) 108 (90 BASE) MCG/ACT inhaler Inhale 2 puffs into the lungs every 6 (six) hours as needed. For shortness of breath    Historical Provider, MD  dicyclomine (BENTYL) 20 MG tablet Take 1 tablet (20 mg total) by mouth 2 (two) times daily. 06/11/14   Martie Lee, PA-C  HYDROcodone-acetaminophen (NORCO/VICODIN) 5-325 MG per tablet Take 1 tablet by mouth every 4 (four) hours as needed for moderate pain. 06/11/14   Martie Lee, PA-C  Multiple Vitamins-Minerals (EMERGEN-C IMMUNE PLUS) PACK Take 1 packet by mouth once.    Historical Provider, MD  ondansetron (ZOFRAN ODT) 4 MG disintegrating tablet 4mg  ODT q4 hours prn nausea/vomit 06/11/14   Collier Salina  S Dammen, PA-C  traZODone (DESYREL) 100 MG tablet Take 1 tablet (100 mg total) by mouth at bedtime as needed for sleep. 09/10/14   Billy Fischer, MD   BP 129/83  Pulse 71  Temp(Src) 98.5 F (36.9 C) (Oral)  Resp 16  SpO2 100% Physical Exam  Nursing note and vitals reviewed. Constitutional: She is oriented to person, place, and time. She appears well-developed and well-nourished. No distress.  HENT:  Head: Normocephalic.  Right Ear: External ear normal.  Left Ear: External ear normal.  Mouth/Throat: Oropharynx is clear and moist.  Eyes: Conjunctivae and EOM are normal. Pupils are equal,  round, and reactive to light.  Neck: Normal range of motion. Neck supple.  Cardiovascular: Normal heart sounds.   Pulmonary/Chest: Breath sounds normal.  Musculoskeletal: Normal range of motion.  Lymphadenopathy:    She has no cervical adenopathy.  Neurological: She is alert and oriented to person, place, and time. No cranial nerve deficit. Coordination normal.  Skin: Skin is warm and dry.    ED Course  Procedures (including critical care time) Labs Review Labs Reviewed - No data to display  Imaging Review No results found.   MDM   1. Headache in back of head        Billy Fischer, MD 09/10/14 9024  Billy Fischer, MD 09/10/14 2007

## 2014-09-10 NOTE — ED Notes (Addendum)
C/o HA x 3 week, no relief w Excedrin. . C/o head feels swollen. NAD, calm, conversant, moves in free and fluid gait

## 2014-10-03 IMAGING — CR DG CHEST 2V
2 series · 2 of 2 positions shown · non-contrast
Comparison: CT PE study 10/28/2013.  Chest 10/28/2013.

CLINICAL DATA: Chest pain and shortness of breath for 3 weeks.

EXAM:
CHEST  2 VIEW

[w chest pa]
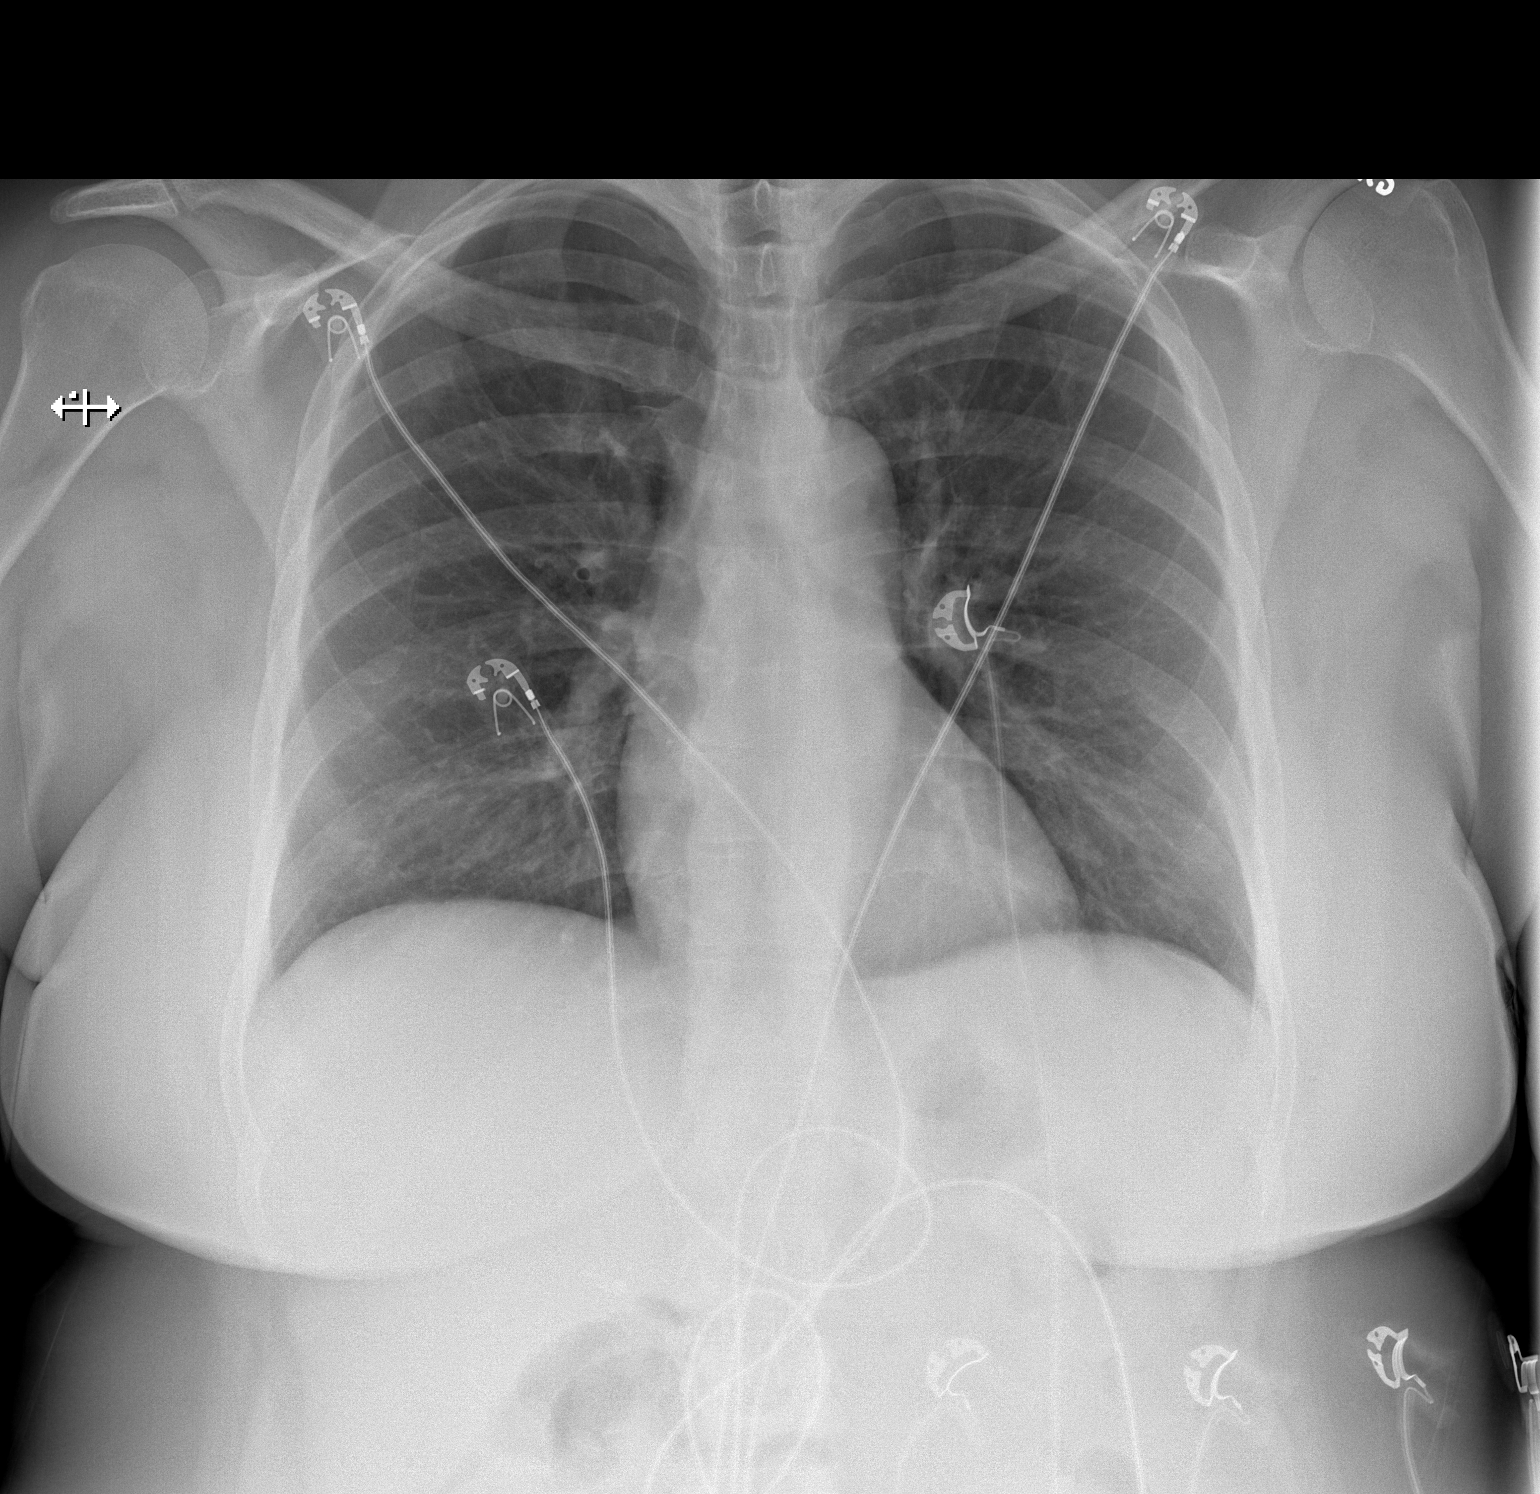

[w chest lat]
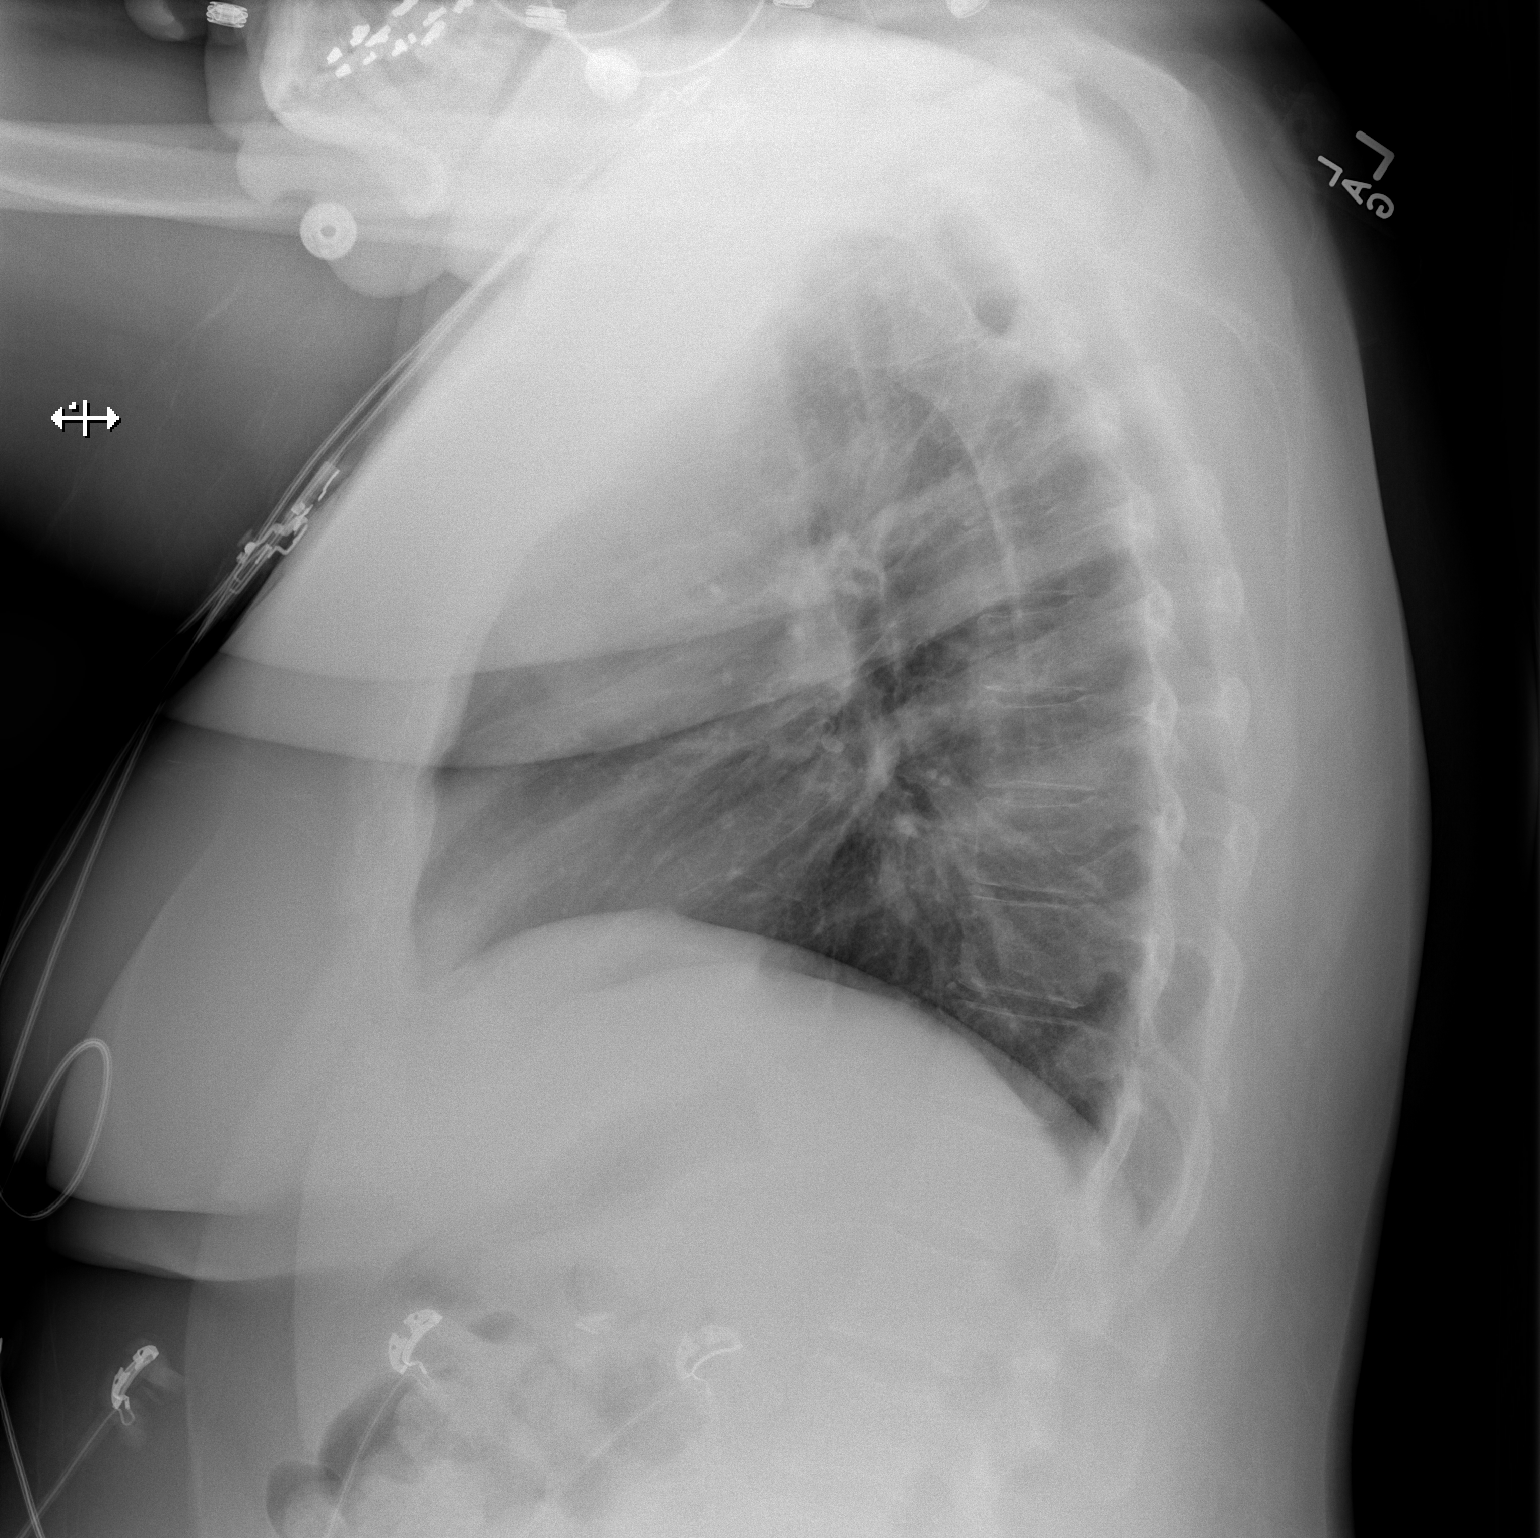

[2 of 2 positions shown; findings below may reference images not displayed]

FINDINGS: The heart size and mediastinal contours are within normal limits.
Both lungs are clear. The visualized skeletal structures are
unremarkable.
IMPRESSION: No active cardiopulmonary disease.

## 2015-01-19 ENCOUNTER — Encounter (HOSPITAL_COMMUNITY): Payer: Self-pay | Admitting: Emergency Medicine

## 2015-01-19 ENCOUNTER — Emergency Department (INDEPENDENT_AMBULATORY_CARE_PROVIDER_SITE_OTHER)
Admission: EM | Admit: 2015-01-19 | Discharge: 2015-01-19 | Disposition: A | Discharge status: Home / Self Care | Payer: Self-pay | Source: Home / Self Care | Attending: Emergency Medicine | Admitting: Emergency Medicine

## 2015-01-19 ENCOUNTER — Emergency Department (INDEPENDENT_AMBULATORY_CARE_PROVIDER_SITE_OTHER): Payer: BC Managed Care – PPO

## 2015-01-19 DIAGNOSIS — R51 Headache: Secondary | ICD-10-CM

## 2015-01-19 DIAGNOSIS — M546 Pain in thoracic spine: Secondary | ICD-10-CM

## 2015-01-19 DIAGNOSIS — M549 Dorsalgia, unspecified: Secondary | ICD-10-CM

## 2015-01-19 DIAGNOSIS — F418 Other specified anxiety disorders: Secondary | ICD-10-CM

## 2015-01-19 DIAGNOSIS — R519 Headache, unspecified: Secondary | ICD-10-CM

## 2015-01-19 MED ORDER — ALPRAZOLAM 1 MG PO TABS: 1.0000 mg | ORAL_TABLET | Freq: Three times a day (TID) | ORAL | Status: DC | PRN
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
## 2015-02-19 ENCOUNTER — Encounter (HOSPITAL_COMMUNITY): Payer: Self-pay | Admitting: Emergency Medicine

## 2015-02-19 ENCOUNTER — Emergency Department (HOSPITAL_COMMUNITY)
Admission: EM | Admit: 2015-02-19 | Discharge: 2015-02-19 | Disposition: A | Discharge status: Home / Self Care | Payer: BC Managed Care – PPO | Attending: Emergency Medicine | Admitting: Emergency Medicine

## 2015-02-19 DIAGNOSIS — Z8719 Personal history of other diseases of the digestive system: Secondary | ICD-10-CM | POA: Diagnosis not present

## 2015-02-19 DIAGNOSIS — W57XXXA Bitten or stung by nonvenomous insect and other nonvenomous arthropods, initial encounter: Secondary | ICD-10-CM | POA: Insufficient documentation

## 2015-02-19 DIAGNOSIS — Y929 Unspecified place or not applicable: Secondary | ICD-10-CM | POA: Diagnosis not present

## 2015-02-19 DIAGNOSIS — S40862A Insect bite (nonvenomous) of left upper arm, initial encounter: Secondary | ICD-10-CM | POA: Diagnosis not present

## 2015-02-19 DIAGNOSIS — Z79899 Other long term (current) drug therapy: Secondary | ICD-10-CM | POA: Diagnosis not present

## 2015-02-19 DIAGNOSIS — J45909 Unspecified asthma, uncomplicated: Secondary | ICD-10-CM | POA: Insufficient documentation

## 2015-02-19 DIAGNOSIS — Y990 Civilian activity done for income or pay: Secondary | ICD-10-CM | POA: Diagnosis not present

## 2015-02-19 DIAGNOSIS — Y939 Activity, unspecified: Secondary | ICD-10-CM | POA: Insufficient documentation

## 2015-02-19 DIAGNOSIS — M069 Rheumatoid arthritis, unspecified: Secondary | ICD-10-CM | POA: Insufficient documentation

## 2015-02-19 MED ORDER — PREDNISONE 50 MG PO TABS: ORAL_TABLET | ORAL | Status: DC

## 2015-02-19 NOTE — ED Provider Notes (Signed)
CSN: [id number]     Arrival date & time 02/19/15  1723 History  This chart was scribed for non-physician practitioner, Hollace Kinnier. Threasa Alpha, PA-C working with Charlesetta Shanks, MD by Tula Nakayama, ED scribe. This patient was seen in room TR07C/TR07C and the patient's care was started at 6:22 PM   Chief Complaint  Patient presents with  . Insect Bite   The history is provided by the patient. No language interpreter was used.   HPI Comments: [name] is a 39 y.o. female who presents to the Emergency Department complaining of 3 large, red insect bites to her left arm that occurred yesterday. She states warmth, swelling and pain with extension as associated symptoms. Pt did not see any insects, but she did feel a bug and swatted at her arm. She denies prior adverse reactions to mosquito bites. 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   Past Medical History  Diagnosis Date  . Asthma     diagnosed as  a young adult   . Rheumatoid arthritis(714.0)   . IBS (irritable bowel syndrome)    Past Surgical History  Procedure Laterality Date  . Hernia repair    . Abdominal hysterectomy    . Cystectomy    . Cholecystectomy    . Tubal ligation    . Breast reduction surgery    . Appendectomy     Family History  Problem Relation Age of Onset  . Rheum arthritis Father   . Breast cancer Father     died  . Lupus Maternal Aunt   . Rheum arthritis Mother   . Rheum arthritis Maternal Grandmother   . Rheum arthritis Maternal Grandfather   . Rheum arthritis Paternal Grandfather   . Rheum arthritis Paternal Grandmother    History  Substance Use Topics  . Smoking status: Never Smoker   . Smokeless tobacco: Never Used  . Alcohol Use: No   OB History    No data available     Review of Systems  Skin: Positive for wound.  All other systems reviewed and are negative.     Allergies  Bee venom; Diclofenac; Toradol; Metoclopramide; and Tramadol  Home Medications    Prior to Admission medications   Medication Sig Start Date End Date Taking? Authorizing Provider  albuterol (PROVENTIL HFA;VENTOLIN HFA) 108 (90 BASE) MCG/ACT inhaler Inhale 2 puffs into the lungs every 6 (six) hours as needed. For shortness of breath   Yes Historical Provider, MD  ALPRAZolam (XANAX) 1 MG tablet Take 1 tablet (1 mg total) by mouth 3 (three) times daily as needed for anxiety. 01/19/15  Yes Liam Graham, PA-C  diazepam (VALIUM) 10 MG tablet Take 10 mg by mouth every 6 (six) hours as needed for anxiety. For hand pain   Yes Historical Provider, MD  HYDROcodone-acetaminophen (NORCO/VICODIN) 5-325 MG per tablet Take 1 tablet by mouth every 4 (four) hours as needed for moderate pain. 06/11/14  Yes Peter Dammen, PA-C  Propylene Glycol (SYSTANE BALANCE OP) Place 1 drop into both eyes daily as needed. For dry eyes   Yes Historical Provider, MD  acetaminophen (TYLENOL) 500 MG tablet Take 1,000 mg by mouth every 6 (six) hours as needed (for pain.).    Historical Provider, MD  dicyclomine (BENTYL) 20 MG tablet Take 1 tablet (20 mg total) by mouth 2 (two) times daily. Patient not taking: Reported on 02/19/2015 06/11/14   Hazel Sams, PA-C  DOXYCYCLINE HYCLATE PO Take by mouth.  Historical Provider, MD  Multiple Vitamins-Minerals (EMERGEN-C IMMUNE PLUS) PACK Take 1 packet by mouth once.    Historical Provider, MD  ondansetron (ZOFRAN ODT) 4 MG disintegrating tablet 4mg  ODT q4 hours prn nausea/vomit Patient not taking: Reported on 02/19/2015 06/11/14   Hazel Sams, PA-C  traZODone (DESYREL) 100 MG tablet Take 1 tablet (100 mg total) by mouth at bedtime as needed for sleep. Patient not taking: Reported on 02/19/2015 09/10/14   Billy Fischer, MD   BP 129/82 mmHg  Pulse 84  Temp(Src) 98.6 F (37 C) (Oral)  Resp 18  Ht 5\' 5"  (1.651 m)  Wt 275 lb (124.739 kg)  BMI 45.76 kg/m2  SpO2 97% Physical Exam  Constitutional: She appears well-developed and well-nourished. No distress.  HENT:  Head:  Normocephalic and atraumatic.  Eyes: Conjunctivae and EOM are normal.  Neck: Neck supple. No tracheal deviation present.  Cardiovascular: Normal rate.   Pulmonary/Chest: Effort normal. No respiratory distress.  Skin: Skin is warm and dry.  3 areas red, raised with indurated center left arm  Psychiatric: She has a normal mood and affect. Her behavior is normal.  Nursing note and vitals reviewed.   ED Course  Procedures  DIAGNOSTIC STUDIES: Oxygen Saturation is 97% on RA, normal by my interpretation.    COORDINATION OF CARE: 6:29 PM Discussed treatment plan with pt at bedside and pt agreed to plan.   Labs Review Labs Reviewed - No data to display  Imaging Review No results found.   EKG Interpretation None      MDM   Final diagnoses:  Insect bite    rx for prednisone 50 mg x 3 days.  Benadryl Return if any problems.   Hollace Kinnier Pritchett, PA-C 02/19/15 [place], MD 02/22/15 [phone]

## 2015-02-19 NOTE — Discharge Instructions (Signed)

## 2015-02-19 NOTE — ED Notes (Signed)
Pt st's she was bitten by unknow insect yesterday at work   Pt c/o 3 areas on right arm that are red and painful

## 2015-05-17 ENCOUNTER — Encounter (HOSPITAL_COMMUNITY): Payer: Self-pay | Admitting: *Deleted

## 2015-05-17 DIAGNOSIS — R110 Nausea: Secondary | ICD-10-CM | POA: Diagnosis not present

## 2015-05-17 DIAGNOSIS — Z9049 Acquired absence of other specified parts of digestive tract: Secondary | ICD-10-CM | POA: Insufficient documentation

## 2015-05-17 DIAGNOSIS — J45909 Unspecified asthma, uncomplicated: Secondary | ICD-10-CM | POA: Diagnosis not present

## 2015-05-17 DIAGNOSIS — R109 Unspecified abdominal pain: Secondary | ICD-10-CM | POA: Insufficient documentation

## 2015-05-17 DIAGNOSIS — K5900 Constipation, unspecified: Secondary | ICD-10-CM | POA: Insufficient documentation

## 2015-05-17 DIAGNOSIS — Z8739 Personal history of other diseases of the musculoskeletal system and connective tissue: Secondary | ICD-10-CM | POA: Insufficient documentation

## 2015-05-17 DIAGNOSIS — R1030 Lower abdominal pain, unspecified: Secondary | ICD-10-CM | POA: Diagnosis present

## 2015-05-17 DIAGNOSIS — R269 Unspecified abnormalities of gait and mobility: Secondary | ICD-10-CM | POA: Diagnosis not present

## 2015-05-17 LAB — URINALYSIS, ROUTINE W REFLEX MICROSCOPIC
Bilirubin Urine: NEGATIVE
Glucose, UA: NEGATIVE mg/dL
HGB URINE DIPSTICK: NEGATIVE
KETONES UR: NEGATIVE mg/dL
Leukocytes, UA: NEGATIVE
Nitrite: NEGATIVE
Protein, ur: NEGATIVE mg/dL
Specific Gravity, Urine: 1.034 — ABNORMAL HIGH (ref 1.005–1.030)
UROBILINOGEN UA: 0.2 mg/dL (ref 0.0–1.0)
pH: 5.5 (ref 5.0–8.0)

## 2015-05-17 LAB — COMPREHENSIVE METABOLIC PANEL
ALT: 13 U/L — AB (ref 14–54)
ANION GAP: 6 (ref 5–15)
AST: 25 U/L (ref 15–41)
Albumin: 3.2 g/dL — ABNORMAL LOW (ref 3.5–5.0)
Alkaline Phosphatase: 85 U/L (ref 38–126)
BUN: 11 mg/dL (ref 6–20)
CALCIUM: 9.1 mg/dL (ref 8.9–10.3)
CO2: 28 mmol/L (ref 22–32)
Chloride: 107 mmol/L (ref 101–111)
Creatinine, Ser: 0.71 mg/dL (ref 0.44–1.00)
GFR calc non Af Amer: >60 mL/min (ref >60)
Glucose, Bld: 100 mg/dL — ABNORMAL HIGH (ref 65–99)
Potassium: 4.6 mmol/L (ref 3.5–5.1)
SODIUM: 141 mmol/L (ref 135–145)
Total Bilirubin: 1 mg/dL (ref 0.3–1.2)
Total Protein: 6.5 g/dL (ref 6.5–8.1)

## 2015-05-17 LAB — CBC WITH DIFFERENTIAL/PLATELET
BASOS ABS: 0 10*3/uL (ref 0.0–0.1)
BASOS PCT: 0 % (ref 0–1)
EOS ABS: 0.2 10*3/uL (ref 0.0–0.7)
Eosinophils Relative: 3 % (ref 0–5)
HEMATOCRIT: 35.2 % — AB (ref 36.0–46.0)
Hemoglobin: 11.2 g/dL — ABNORMAL LOW (ref 12.0–15.0)
LYMPHS ABS: 1.7 10*3/uL (ref 0.7–4.0)
Lymphocytes Relative: 29 % (ref 12–46)
MCH: 28.4 pg (ref 26.0–34.0)
MCHC: 31.8 g/dL (ref 30.0–36.0)
MCV: 89.3 fL (ref 78.0–100.0)
Monocytes Absolute: 0.3 10*3/uL (ref 0.1–1.0)
Monocytes Relative: 5 % (ref 3–12)
Neutro Abs: 3.7 10*3/uL (ref 1.7–7.7)
Neutrophils Relative %: 63 % (ref 43–77)
Platelets: 268 10*3/uL (ref 150–400)
RBC: 3.94 MIL/uL (ref 3.87–5.11)
RDW: 13.5 % (ref 11.5–15.5)
WBC: 5.8 10*3/uL (ref 4.0–10.5)

## 2015-05-17 LAB — LIPASE, BLOOD: Lipase: 19 U/L — ABNORMAL LOW (ref 22–51)

## 2015-05-17 NOTE — ED Notes (Signed)
The pt is c/o abd pain and bloating for 3-4 days.  Weight gain.  Nauseated.  lmp nione

## 2015-05-18 ENCOUNTER — Emergency Department (HOSPITAL_COMMUNITY)
Admission: EM | Admit: 2015-05-18 | Discharge: 2015-05-18 | Disposition: A | Discharge status: Home / Self Care | Payer: BC Managed Care – PPO | Attending: Emergency Medicine | Admitting: Emergency Medicine

## 2015-05-18 ENCOUNTER — Emergency Department (HOSPITAL_COMMUNITY): Payer: BC Managed Care – PPO

## 2015-05-18 DIAGNOSIS — R109 Unspecified abdominal pain: Secondary | ICD-10-CM

## 2015-05-18 MED ORDER — MORPHINE SULFATE 4 MG/ML IJ SOLN
4.0000 mg | Freq: Once | INTRAMUSCULAR | Status: AC
  Administered 2015-05-18: 4 mg via INTRAMUSCULAR
  Filled 2015-05-18: qty 1

## 2015-05-18 MED ORDER — OXYCODONE-ACETAMINOPHEN 5-325 MG PO TABS
2.0000 | ORAL_TABLET | Freq: Once | ORAL | Status: AC
  Administered 2015-05-18: 2 via ORAL
  Filled 2015-05-18: qty 2

## 2015-05-18 MED ORDER — ONDANSETRON 4 MG PO TBDP
8.0000 mg | ORAL_TABLET | Freq: Once | ORAL | Status: AC
  Administered 2015-05-18: 8 mg via ORAL
  Filled 2015-05-18: qty 2

## 2015-05-18 NOTE — ED Provider Notes (Signed)
CSN: [id number]     Arrival date & time 05/17/15  2023 History  This chart was scribed for Ripley Fraise, MD by Chester Holstein, ED Scribe. This patient was seen in room B14C/B14C and the patient's care was started at 12:18 AM.    Chief Complaint  Patient presents with  . Abdominal Pain     Patient is a 39 y.o. female presenting with abdominal pain. The history is provided by the patient.  Abdominal Pain Pain location:  Suprapubic Pain quality: pressure   Pain radiation: bilateral hips. Pain severity:  Moderate Onset quality:  Gradual Duration:  3 days Timing:  Constant Progression:  Unchanged Chronicity:  New Relieved by:  None tried Worsened by:  Nothing tried Ineffective treatments:  None tried Associated symptoms: constipation and nausea   Associated symptoms: no diarrhea, no dysuria, no fever, no hematochezia and no vomiting    HPI Comments: REED EIFERT is a 39 y.o. female with PMHx of rheumatoid arthritis and IBS who presents to the Emergency Department complaining of constant lower abdominal pain with onset 3 days ago. Pt describes pain as a pressure as if "someone has put a weight on me". She states she has never experienced this sort of pain before. Pt reports intermittent sharp lower abdominal for several days. She notes pain radiates to her hips, causing her to ambulate with a limp. Pt notes associated abdominal bloating, nausea, and mild constipation which has resolved. Pt's last normal BM was this morning. Pt's abdominal PSHx includes cholecystectomy, tubal ligation, appendectomy, and abdominal hysterectomy. She has not seen a GI specialist in several years. She denies recent heavy lifting. Pt denies fever, vomiting, hematochezia, new back pain, and dysuria.  Past Medical History  Diagnosis Date  . Asthma     diagnosed as  a young adult   . Rheumatoid arthritis(714.0)   . IBS (irritable bowel syndrome)    Past Surgical History  Procedure Laterality Date  .  Hernia repair    . Abdominal hysterectomy    . Cystectomy    . Cholecystectomy    . Tubal ligation    . Breast reduction surgery    . Appendectomy     Family History  Problem Relation Age of Onset  . Rheum arthritis Father   . Breast cancer Father     died  . Lupus Maternal Aunt   . Rheum arthritis Mother   . Rheum arthritis Maternal Grandmother   . Rheum arthritis Maternal Grandfather   . Rheum arthritis Paternal Grandfather   . Rheum arthritis Paternal Grandmother    History  Substance Use Topics  . Smoking status: Never Smoker   . Smokeless tobacco: Never Used  . Alcohol Use: No   OB History    No data available     Review of Systems  Constitutional: Negative for fever.  Gastrointestinal: Positive for nausea, abdominal pain and constipation. Negative for vomiting, diarrhea, blood in stool and hematochezia.  Genitourinary: Negative for dysuria.  Musculoskeletal: Positive for arthralgias and gait problem (limp from pain). Negative for back pain.  All other systems reviewed and are negative.    Allergies  Bee venom; Diclofenac; Toradol; Metoclopramide; and Tramadol  Home Medications   Prior to Admission medications   Medication Sig Start Date End Date Taking? Authorizing Provider  acetaminophen (TYLENOL) 500 MG tablet Take 1,000 mg by mouth every 6 (six) hours as needed (for pain.).   Yes Historical Provider, MD  albuterol (PROVENTIL HFA;VENTOLIN HFA) 108 (90 BASE) MCG/ACT inhaler  Inhale 2 puffs into the lungs every 6 (six) hours as needed. For shortness of breath   Yes Historical Provider, MD  ALPRAZolam (XANAX) 1 MG tablet Take 1 tablet (1 mg total) by mouth 3 (three) times daily as needed for anxiety. 01/19/15  Yes Freeman Caldron Baker, PA-C  Propylene Glycol (SYSTANE BALANCE OP) Place 1 drop into both eyes daily as needed. For dry eyes   Yes Historical Provider, MD  DOXYCYCLINE HYCLATE PO Take by mouth.    Historical Provider, MD   BP 125/85 mmHg  Pulse 78   Temp(Src) 98.7 F (37.1 C) (Oral)  Resp 18  Wt 279 lb 9.6 oz (126.826 kg)  SpO2 100% Physical Exam  Nursing note and vitals reviewed. CONSTITUTIONAL: Well developed/well nourished HEAD: Normocephalic/atraumatic EYES: EOMI/PERRL ENMT: Mucous membranes moist NECK: supple no meningeal signs SPINE/BACK:entire spine nontender CV: S1/S2 noted, no murmurs/rubs/gallops noted LUNGS: Lungs are clear to auscultation bilaterally, no apparent distress ABDOMEN: soft, nontender, no rebound or guarding, bowel sounds noted throughout abdomen GU:no cva tenderness NEURO: Pt is awake/alert/appropriate, moves all extremitiesx4.  No facial droop.  Pt ambulates without difficulty, no limp noted EXTREMITIES: pulses normal/equal, full ROM SKIN: warm, color normal PSYCH: no abnormalities of mood noted, alert and oriented to situation   ED Course  Procedures DIAGNOSTIC STUDIES: Oxygen Saturation is 100% on room air, normal by my interpretation.    COORDINATION OF CARE: 12:29 AM Discussed treatment plan with patient at beside, the patient agrees with the plan and has no further questions at this time.    Imaging/labs negative Pt without vomiting No clinical signs of bowel obstruction She has no signs of acute abdominal emergency Advised follow-up Further workup deferred for now  Labs Review Labs Reviewed  CBC WITH DIFFERENTIAL/PLATELET - Abnormal; Notable for the following:    Hemoglobin 11.2 (*)    HCT 35.2 (*)    All other components within normal limits  COMPREHENSIVE METABOLIC PANEL - Abnormal; Notable for the following:    Glucose, Bld 100 (*)    Albumin 3.2 (*)    ALT 13 (*)    All other components within normal limits  LIPASE, BLOOD - Abnormal; Notable for the following:    Lipase 19 (*)    All other components within normal limits  URINALYSIS, ROUTINE W REFLEX MICROSCOPIC (NOT AT [facility]) - Abnormal; Notable for the following:    Specific Gravity, Urine 1.034 (*)    All other  components within normal limits    Imaging Review Dg Abd Acute W/chest  05/18/2015   CLINICAL DATA:  Lower abdominal pain for 3 days.  EXAM: DG ABDOMEN ACUTE W/ 1V CHEST  COMPARISON:  06/11/2014  FINDINGS: The cardiomediastinal contours are normal. The lungs are clear. There is no free intra-abdominal air. No dilated bowel loops to suggest obstruction. Moderate volume of stool throughout the colon. Clips in the right upper quadrant the abdomen from cholecystectomy. No radiopaque calculi. No acute osseous abnormalities are seen.  IMPRESSION: Negative abdominal radiographs.  No acute pulmonary process.   Electronically Signed   By: Jeb Levering M.D.   On: 05/18/2015 02:36    Medications  oxyCODONE-acetaminophen (PERCOCET/ROXICET) 5-325 MG per tablet 2 tablet (2 tablets Oral Given 05/18/15 0045)  ondansetron (ZOFRAN-ODT) disintegrating tablet 8 mg (8 mg Oral Given 05/18/15 0044)  morphine 4 MG/ML injection 4 mg (4 mg Intramuscular Given 05/18/15 0209)     MDM   Final diagnoses:  Abdominal pain, unspecified abdominal location    Nursing notes including  past medical history and social history reviewed and considered in documentation Labs/vital reviewed myself and considered during evaluation xrays/imaging reviewed by myself and considered during evaluation  I personally performed the services described in this documentation, which was scribed in my presence. The recorded information has been reviewed and is accurate.        Ripley Fraise, MD 05/18/15 [phone]

## 2015-05-18 NOTE — ED Notes (Signed)
Patient returned from xray.

## 2015-05-18 NOTE — Discharge Instructions (Signed)
Abdominal (belly) pain can be caused by many things. any cases can be observed and treated at home after initial evaluation in the emergency department. Even though you are being discharged home, abdominal pain can be unpredictable. Therefore, you need a repeated exam if your pain does not resolve, returns, or worsens. Most patients with abdominal pain don't have to be admitted to the hospital or have surgery, but serious problems like appendicitis and gallbladder attacks can start out as nonspecific pain. Many abdominal conditions cannot be diagnosed in one visit, so follow-up evaluations are very important. SEEK IMMEDIATE MEDICAL ATTENTION IF: The pain does not go away or becomes severe, particularly over the next 8-12 hours.  A temperature above 100.78F develops.  Repeated vomiting occurs (multiple episodes).   In an adult, the left lower portion of the abdomen could be colitis or diverticulitis.  Blood is being passed in stools or vomit (bright red or black tarry stools).  Return also if you develop chest pain, difficulty breathing, dizziness or fainting, or become confused, poorly responsive, or inconsolable.

## 2015-07-27 ENCOUNTER — Encounter (HOSPITAL_COMMUNITY): Payer: Self-pay

## 2015-07-27 DIAGNOSIS — Z79899 Other long term (current) drug therapy: Secondary | ICD-10-CM | POA: Insufficient documentation

## 2015-07-27 DIAGNOSIS — R110 Nausea: Secondary | ICD-10-CM | POA: Insufficient documentation

## 2015-07-27 DIAGNOSIS — R197 Diarrhea, unspecified: Secondary | ICD-10-CM | POA: Diagnosis not present

## 2015-07-27 DIAGNOSIS — R1084 Generalized abdominal pain: Secondary | ICD-10-CM | POA: Diagnosis not present

## 2015-07-27 DIAGNOSIS — R1011 Right upper quadrant pain: Secondary | ICD-10-CM | POA: Diagnosis present

## 2015-07-27 DIAGNOSIS — J45909 Unspecified asthma, uncomplicated: Secondary | ICD-10-CM | POA: Insufficient documentation

## 2015-07-27 LAB — COMPREHENSIVE METABOLIC PANEL
ALBUMIN: 3.5 g/dL (ref 3.5–5.0)
ALT: 14 U/L (ref 14–54)
AST: 17 U/L (ref 15–41)
Alkaline Phosphatase: 86 U/L (ref 38–126)
Anion gap: 11 (ref 5–15)
BILIRUBIN TOTAL: 0.5 mg/dL (ref 0.3–1.2)
BUN: 8 mg/dL (ref 6–20)
CHLORIDE: 105 mmol/L (ref 101–111)
CO2: 25 mmol/L (ref 22–32)
CREATININE: 0.85 mg/dL (ref 0.44–1.00)
Calcium: 9.4 mg/dL (ref 8.9–10.3)
GFR calc Af Amer: >60 mL/min (ref >60)
GFR calc non Af Amer: >60 mL/min (ref >60)
GLUCOSE: 93 mg/dL (ref 65–99)
Potassium: 3.8 mmol/L (ref 3.5–5.1)
Sodium: 141 mmol/L (ref 135–145)
TOTAL PROTEIN: 7 g/dL (ref 6.5–8.1)

## 2015-07-27 LAB — URINALYSIS, ROUTINE W REFLEX MICROSCOPIC
Bilirubin Urine: NEGATIVE
Glucose, UA: NEGATIVE mg/dL
Hgb urine dipstick: NEGATIVE
KETONES UR: NEGATIVE mg/dL
Leukocytes, UA: NEGATIVE
NITRITE: NEGATIVE
Protein, ur: NEGATIVE mg/dL
UROBILINOGEN UA: 0.2 mg/dL (ref 0.0–1.0)
pH: 5 (ref 5.0–8.0)

## 2015-07-27 LAB — CBC
HEMATOCRIT: 36.7 % (ref 36.0–46.0)
Hemoglobin: 12.1 g/dL (ref 12.0–15.0)
MCH: 29.5 pg (ref 26.0–34.0)
MCHC: 33 g/dL (ref 30.0–36.0)
MCV: 89.5 fL (ref 78.0–100.0)
PLATELETS: 293 10*3/uL (ref 150–400)
RBC: 4.1 MIL/uL (ref 3.87–5.11)
RDW: 13.8 % (ref 11.5–15.5)
WBC: 6 10*3/uL (ref 4.0–10.5)

## 2015-07-27 LAB — LIPASE, BLOOD: Lipase: 21 U/L — ABNORMAL LOW (ref 22–51)

## 2015-07-27 MED ORDER — ONDANSETRON 4 MG PO TBDP
4.0000 mg | ORAL_TABLET | Freq: Once | ORAL | Status: AC | PRN
  Administered 2015-07-27: 4 mg via ORAL

## 2015-07-27 MED ORDER — ONDANSETRON 4 MG PO TBDP
ORAL_TABLET | ORAL | Status: AC
  Filled 2015-07-27: qty 1

## 2015-07-27 NOTE — ED Notes (Signed)
Pt reports onset 2-3 days right upper lateral abd pain, feels like something is pushing out, lasts few minutes and occurs several times an hour and stomach is swollen.  Pt reports stomach has swollen in the past when she would over eat but that is not the case with this episode.  Pt c/o nausea.  Last BM today x 3, watery - which is normal bowel pattern for pt.

## 2015-07-28 ENCOUNTER — Emergency Department (HOSPITAL_COMMUNITY)
Admission: EM | Admit: 2015-07-28 | Discharge: 2015-07-28 | Disposition: A | Discharge status: Home / Self Care | Payer: BLUE CROSS/BLUE SHIELD | Attending: Emergency Medicine | Admitting: Emergency Medicine

## 2015-07-28 ENCOUNTER — Emergency Department (HOSPITAL_COMMUNITY): Payer: BLUE CROSS/BLUE SHIELD

## 2015-07-28 DIAGNOSIS — R109 Unspecified abdominal pain: Secondary | ICD-10-CM

## 2015-07-28 MED ORDER — OXYCODONE-ACETAMINOPHEN 5-325 MG PO TABS
1.0000 | ORAL_TABLET | Freq: Once | ORAL | Status: AC
  Administered 2015-07-28: 1 via ORAL

## 2015-07-28 MED ORDER — ACETAMINOPHEN 325 MG PO TABS
650.0000 mg | ORAL_TABLET | Freq: Once | ORAL | Status: DC
  Filled 2015-07-28: qty 2

## 2015-07-28 MED ORDER — ONDANSETRON 4 MG PO TBDP
4.0000 mg | ORAL_TABLET | Freq: Once | ORAL | Status: AC | PRN
  Administered 2015-07-28: 4 mg via ORAL

## 2015-07-28 MED ORDER — DICYCLOMINE HCL 20 MG PO TABS: 20.0000 mg | ORAL_TABLET | Freq: Four times a day (QID) | ORAL | Status: DC | PRN

## 2015-07-28 MED ORDER — MORPHINE SULFATE 4 MG/ML IJ SOLN
4.0000 mg | Freq: Once | INTRAMUSCULAR | Status: AC
  Administered 2015-07-28: 4 mg via INTRAVENOUS
  Filled 2015-07-28: qty 1

## 2015-07-28 MED ORDER — OXYCODONE-ACETAMINOPHEN 5-325 MG PO TABS
ORAL_TABLET | ORAL | Status: DC
Start: 2015-07-28 — End: 2015-07-28
  Filled 2015-07-28: qty 1

## 2015-07-28 MED ORDER — ONDANSETRON 4 MG PO TBDP
ORAL_TABLET | ORAL | Status: AC
  Filled 2015-07-28: qty 1

## 2015-07-28 NOTE — ED Notes (Signed)
Pt verbalized understanding of d/c instructions and has no further questions. Pt discharged with son driving. stablen and NAD.

## 2015-07-28 NOTE — ED Notes (Signed)
Pt offered 650mg  of tylenol and refused. Pt states "tylenol is not going to help if percocet didn't help"

## 2015-07-28 NOTE — ED Provider Notes (Signed)
CSN: [id number]     Arrival date & time 07/27/15  2150 History  This chart was scribed for Delora Fuel, MD by Ludger Nutting, ED Scribe. This patient was seen in room A05C/A05C and the patient's care was started 2:43 AM.    Chief Complaint  Patient presents with  . Abdominal Pain    The history is provided by the patient. No language interpreter was used.     HPI Comments: [name] is a 39 y.o. female who presents to the Emergency Department complaining of a few days of intermittent, unchanged, right upper lateral abdominal pain with associated abdominal swelling. She also complains of a sensation that something is protruding from the right abdomen. She states the pain lasts for a few seconds at a time and occurs several times in an hour. She has associated nausea along with diarrhea which is she has at baseline due to IBS. She denies vomiting, dysuria, hematuria.   Past Medical History  Diagnosis Date  . Asthma     diagnosed as  a young adult   . Rheumatoid arthritis(714.0)   . IBS (irritable bowel syndrome)    Past Surgical History  Procedure Laterality Date  . Hernia repair    . Abdominal hysterectomy    . Cystectomy    . Cholecystectomy    . Tubal ligation    . Breast reduction surgery    . Appendectomy     Family History  Problem Relation Age of Onset  . Rheum arthritis Father   . Breast cancer Father     died  . Lupus Maternal Aunt   . Rheum arthritis Mother   . Rheum arthritis Maternal Grandmother   . Rheum arthritis Maternal Grandfather   . Rheum arthritis Paternal Grandfather   . Rheum arthritis Paternal Grandmother    Social History  Substance Use Topics  . Smoking status: Never Smoker   . Smokeless tobacco: Never Used  . Alcohol Use: No   OB History    No data available     Review of Systems  Gastrointestinal: Positive for nausea, abdominal pain, diarrhea and abdominal distention. Negative for vomiting.  Genitourinary: Negative for dysuria and  hematuria.  All other systems reviewed and are negative.     Allergies  Bee venom; Diclofenac; Toradol; Metoclopramide; and Tramadol  Home Medications   Prior to Admission medications   Medication Sig Start Date End Date Taking? Authorizing Provider  acetaminophen (TYLENOL) 500 MG tablet Take 1,000 mg by mouth every 6 (six) hours as needed (for pain.).    Historical Provider, MD  albuterol (PROVENTIL HFA;VENTOLIN HFA) 108 (90 BASE) MCG/ACT inhaler Inhale 2 puffs into the lungs every 6 (six) hours as needed. For shortness of breath    Historical Provider, MD  ALPRAZolam (XANAX) 1 MG tablet Take 1 tablet (1 mg total) by mouth 3 (three) times daily as needed for anxiety. 01/19/15   Liam Graham, PA-C  DOXYCYCLINE HYCLATE PO Take by mouth.    Historical Provider, MD  Propylene Glycol (SYSTANE BALANCE OP) Place 1 drop into both eyes daily as needed. For dry eyes    Historical Provider, MD   BP 123/75 mmHg  Pulse 66  Temp(Src) 97.5 F (36.4 C) (Oral)  Resp 18  Ht 5\' 5"  (1.651 m)  Wt 273 lb 8 oz (124.059 kg)  BMI 45.51 kg/m2  SpO2 100% Physical Exam  Constitutional: She is oriented to person, place, and time. She appears well-developed and well-nourished.  HENT:  Head:  Normocephalic and atraumatic.  Eyes: EOM are normal. Pupils are equal, round, and reactive to light.  Neck: Normal range of motion. Neck supple. No JVD present.  Cardiovascular: Normal rate, regular rhythm and normal heart sounds.   No murmur heard. Pulmonary/Chest: Effort normal and breath sounds normal. She has no wheezes. She has no rales. She exhibits no tenderness.  Abdominal: Soft. Bowel sounds are normal. She exhibits no mass. There is no tenderness.  Tenderness diffusely. No rebound or guarding. Bowel sounds active.   Musculoskeletal: Normal range of motion. She exhibits edema ( 1+ pitting edema bilaterally).  Neurological: She is alert and oriented to person, place, and time. No cranial nerve deficit. She  exhibits normal muscle tone. Coordination normal.  Skin: Skin is warm and dry. No rash noted.  Psychiatric: She has a normal mood and affect. Thought content normal.  Nursing note and vitals reviewed.   ED Course  Procedures (including critical care time)  DIAGNOSTIC STUDIES: Oxygen Saturation is 100% on RA, normal by my interpretation.    COORDINATION OF CARE: 2:50 AM Discussed treatment plan with pt at bedside and pt agreed to plan.   Labs Review Results for orders placed or performed during the hospital encounter of 07/28/15  Lipase, blood  Result Value Ref Range   Lipase 21 (L) 22 - 51 U/L  Comprehensive metabolic panel  Result Value Ref Range   Sodium 141 135 - 145 mmol/L   Potassium 3.8 3.5 - 5.1 mmol/L   Chloride 105 101 - 111 mmol/L   CO2 25 22 - 32 mmol/L   Glucose, Bld 93 65 - 99 mg/dL   BUN 8 6 - 20 mg/dL   Creatinine, Ser 0.85 0.44 - 1.00 mg/dL   Calcium 9.4 8.9 - 10.3 mg/dL   Total Protein 7.0 6.5 - 8.1 g/dL   Albumin 3.5 3.5 - 5.0 g/dL   AST 17 15 - 41 U/L   ALT 14 14 - 54 U/L   Alkaline Phosphatase 86 38 - 126 U/L   Total Bilirubin 0.5 0.3 - 1.2 mg/dL   GFR calc non Af Amer >60 >60 mL/min   GFR calc Af Amer >60 >60 mL/min   Anion gap 11 5 - 15  CBC  Result Value Ref Range   WBC 6.0 4.0 - 10.5 K/uL   RBC 4.10 3.87 - 5.11 MIL/uL   Hemoglobin 12.1 12.0 - 15.0 g/dL   HCT 36.7 36.0 - 46.0 %   MCV 89.5 78.0 - 100.0 fL   MCH 29.5 26.0 - 34.0 pg   MCHC 33.0 30.0 - 36.0 g/dL   RDW 13.8 11.5 - 15.5 %   Platelets 293 150 - 400 K/uL  Urinalysis, Routine w reflex microscopic (not at [facility])  Result Value Ref Range   Color, Urine YELLOW YELLOW   APPearance CLOUDY (A) CLEAR   Specific Gravity, Urine >1.030 (H) 1.005 - 1.030   pH 5.0 5.0 - 8.0   Glucose, UA NEGATIVE NEGATIVE mg/dL   Hgb urine dipstick NEGATIVE NEGATIVE   Bilirubin Urine NEGATIVE NEGATIVE   Ketones, ur NEGATIVE NEGATIVE mg/dL   Protein, ur NEGATIVE NEGATIVE mg/dL   Urobilinogen, UA 0.2 0.0 -  1.0 mg/dL   Nitrite NEGATIVE NEGATIVE   Leukocytes, UA NEGATIVE NEGATIVE   Imaging Review Dg Abd Acute W/chest  07/28/2015   CLINICAL DATA:  Subacute onset of generalized abdominal pain, nausea and vomiting. Initial encounter.  EXAM: DG ABDOMEN ACUTE W/ 1V CHEST  COMPARISON:  Chest and abdominal radiographs  performed 05/18/2015  FINDINGS: The lungs are well-aerated and clear. There is no evidence of focal opacification, pleural effusion or pneumothorax. The cardiomediastinal silhouette is within normal limits.  The visualized bowel gas pattern is unremarkable. Scattered stool and air are seen within the colon; there is no evidence of small bowel dilatation to suggest obstruction. No free intra-abdominal air is identified on the provided upright view. Clips are noted within the right upper quadrant, reflecting prior cholecystectomy.  No acute osseous abnormalities are seen; the sacroiliac joints are unremarkable in appearance.  IMPRESSION: 1. Unremarkable bowel gas pattern; no free intra-abdominal air seen. Small to moderate amount of stool noted in the colon. 2. No acute cardiopulmonary process seen.   Electronically Signed   By: Garald Balding M.D.   On: 07/28/2015 52:48   I, Delora Fuel, MD, personally reviewed and evaluated these images and lab results as part of my medical decision-making.   MDM   Final diagnoses:  Abdominal pain, unspecified abdominal location    Upper abdominal pain with a benign exam. Laboratory workup is unremarkable. WBC is normal and liver enzymes are all normal. X-rays are also normal. Although patient describes feeling that something is bulging, I cannot appreciate that. With benign exam and normal labs, I do not see an indication for advanced imaging and this was explained to the patient. She is generally upset with inability to give a diagnosis. She is given a prescription for dicyclomine for pain, and is referred to gastroenterology for outpatient workup. Advised to  return should symptoms worsen. I have discussed with her probable diagnosis of irritable bowel syndrome which patient states she has been diagnosed with in the past.  I personally performed the services described in this documentation, which was scribed in my presence. The recorded information has been reviewed and is accurate.     Delora Fuel, MD 18/59/09 [id number]

## 2015-07-28 NOTE — Discharge Instructions (Signed)
Please follow up with [facility]. Return to the ED if your symptoms are getting worse.   Abdominal Pain Many things can cause abdominal pain. Usually, abdominal pain is not caused by a disease and will improve without treatment. It can often be observed and treated at home. Your health care provider will do a physical exam and possibly order blood tests and X-rays to help determine the seriousness of your pain. However, in many cases, more time must pass before a clear cause of the pain can be found. Before that point, your health care provider may not know if you need more testing or further treatment. HOME CARE INSTRUCTIONS  Monitor your abdominal pain for any changes. The following actions may help to alleviate any discomfort you are experiencing:  Only take over-the-counter or prescription medicines as directed by your health care provider.  Do not take laxatives unless directed to do so by your health care provider.  Try a clear liquid diet (broth, tea, or water) as directed by your health care provider. Slowly move to a bland diet as tolerated. SEEK MEDICAL CARE IF:  You have unexplained abdominal pain.  You have abdominal pain associated with nausea or diarrhea.  You have pain when you urinate or have a bowel movement.  You experience abdominal pain that wakes you in the night.  You have abdominal pain that is worsened or improved by eating food.  You have abdominal pain that is worsened with eating fatty foods.  You have a fever. SEEK IMMEDIATE MEDICAL CARE IF:   Your pain does not go away within 2 hours.  You keep throwing up (vomiting).  Your pain is felt only in portions of the abdomen, such as the right side or the left lower portion of the abdomen.  You pass bloody or black tarry stools. MAKE SURE YOU:  Understand these instructions.   Will watch your condition.   Will get help right away if you are not doing well or get worse.  Document  Released: 09/09/2005 Document Revised: 12/05/2013 Document Reviewed: 08/09/2013 [facility] Patient Information 2015 [place], [place]. This information is not intended to replace advice given to you by your health care provider. Make sure you discuss any questions you have with your health care provider.  Irritable Bowel Syndrome Irritable bowel syndrome (IBS) is caused by a disturbance of normal bowel function and is a common digestive disorder. You may also hear this condition called spastic colon, mucous colitis, and irritable colon. There is no cure for IBS. However, symptoms often gradually improve or disappear with a good diet, stress management, and medicine. This condition usually appears in late adolescence or early adulthood. Women develop it twice as often as men. CAUSES  After food has been digested and absorbed in the small intestine, waste material is moved into the large intestine, or colon. In the colon, water and salts are absorbed from the undigested products coming from the small intestine. The remaining residue, or fecal material, is held for elimination. Under normal circumstances, gentle, rhythmic contractions of the bowel walls push the fecal material along the colon toward the rectum. In IBS, however, these contractions are irregular and poorly coordinated. The fecal material is either retained too long, resulting in constipation, or expelled too soon, producing diarrhea. SIGNS AND SYMPTOMS  The most common symptom of IBS is abdominal pain. It is often in the lower left side of the abdomen, but it may occur anywhere in the abdomen. The pain comes from spasms of the  bowel muscles happening too much and from the buildup of gas and fecal material in the colon. This pain:  Can range from sharp abdominal cramps to a dull, continuous ache.  Often worsens soon after eating.  Is often relieved by having a bowel movement or passing gas. Abdominal pain is usually accompanied by constipation,  but it may also produce diarrhea. The diarrhea often occurs right after a meal or upon waking up in the morning. The stools are often soft, watery, and flecked with mucus. Other symptoms of IBS include:  Bloating.  Loss of appetite.  Heartburn.  Backache.  Dull pain in the arms or shoulders.  Nausea.  Burping.  Vomiting.  Gas. IBS may also cause symptoms that are unrelated to the digestive system, such as:  Fatigue.  Headaches.  Anxiety.  Shortness of breath.  Trouble concentrating.  Dizziness. These symptoms tend to come and go. DIAGNOSIS  The symptoms of IBS may seem like symptoms of other, more serious digestive disorders. Your health care provider may want to perform tests to exclude these disorders.  TREATMENT Many medicines are available to help correct bowel function or relieve bowel spasms and abdominal pain. Among the medicines available are:  Laxatives for severe constipation and to help restore normal bowel habits.  Specific antidiarrheal medicines to treat severe or lasting diarrhea.  Antispasmodic agents to relieve intestinal cramps. Your health care provider may also decide to treat you with a mild tranquilizer or sedative during unusually stressful periods in your life. Your health care provider may also prescribe antidepressant medicine. The use of this medicine has been shown to reduce pain and other symptoms of IBS. Remember that if any medicine is prescribed for you, you should take it exactly as directed. Make sure your health care provider knows how well it worked for you. HOME CARE INSTRUCTIONS   Take all medicines as directed by your health care provider.  Avoid foods that are high in fat or oils, such as heavy cream, butter, frankfurters, sausage, and other fatty meats.  Avoid foods that make you go to the bathroom, such as fruit, fruit juice, and dairy products.  Cut out carbonated drinks, chewing gum, and "gassy" foods such as beans and  cabbage. This may help relieve bloating and burping.  Eat foods with bran, and drink plenty of liquids with the bran foods. This helps relieve constipation.  Keep track of what foods seem to bring on your symptoms.  Avoid emotionally charged situations or circumstances that produce anxiety.  Start or continue exercising.  Get plenty of rest and sleep. Document Released: 11/30/2005 Document Revised: 12/05/2013 Document Reviewed: 07/20/2008 [facility] Patient Information 2015 [place], [place]. This information is not intended to replace advice given to you by your health care provider. Make sure you discuss any questions you have with your health care provider.  Dicyclomine tablets or capsules What is this medicine? DICYCLOMINE (dye SYE kloe meen) is used to treat bowel problems including irritable bowel syndrome. This medicine may be used for other purposes; ask your health care provider or pharmacist if you have questions. COMMON BRAND NAME(S): Bentyl What should I tell my health care provider before I take this medicine? They need to know if you have any of these conditions: -difficulty passing urine -esophagus problems or heartburn -glaucoma -heart disease, or previous heart attack -myasthenia gravis -prostate trouble -stomach infection, or obstruction -ulcerative colitis -an unusual or allergic reaction to dicyclomine, other medicines, foods, dyes, or preservatives -pregnant or trying to get pregnant -  breast-feeding How should I use this medicine? Take this medicine by mouth with a glass of water. Follow the directions on the prescription label. It is best to take this medicine on an empty stomach, 30 minutes to 1 hour before meals. Take your medicine at regular intervals. Do not take your medicine more often than directed. Talk to your pediatrician regarding the use of this medicine in children. Special care may be needed. While this drug may be prescribed for children as young as  51 months of age for selected conditions, precautions do apply. Patients over 84 years old may have a stronger reaction and need a smaller dose. Overdosage: If you think you have taken too much of this medicine contact a poison control center or emergency room at once. NOTE: This medicine is only for you. Do not share this medicine with others. What if I miss a dose? If you miss a dose, take it as soon as you can. If it is almost time for your next dose, take only that dose. Do not take double or extra doses. What may interact with this medicine? -amantadine -antacids -benztropine -digoxin -disopyramide -medicines for allergies, colds and breathing difficulties -medicines for alzheimer's disease -medicines for anxiety or sleeping problems -medicines for depression or psychotic disturbances -medicines for diarrhea -medicines for pain -metoclopramide -tegaserod This list may not describe all possible interactions. Give your health care provider a list of all the medicines, herbs, non-prescription drugs, or dietary supplements you use. Also tell them if you smoke, drink alcohol, or use illegal drugs. Some items may interact with your medicine. What should I watch for while using this medicine? You may get drowsy, dizzy, or have blurred vision. Do not drive, use machinery, or do anything that needs mental alertness until you know how this medicine affects you. To reduce the risk of dizzy or fainting spells, do not sit or stand up quickly, especially if you are an older patient. Alcohol can make you more drowsy, avoid alcoholic drinks. Stay out of bright light and wear sunglasses if this medicine makes your eyes more sensitive to light. Avoid extreme heat (hot tubs, saunas). This medicine can cause you to sweat less than normal. Your body temperature could increase to dangerous levels, which may lead to heat stroke. Antacids can stop this medicine from working. If you get an upset stomach and want  to take an antacid, make sure there is an interval of at least 1 to 2 hours before or after you take this medicine. Your mouth may get dry. Chewing sugarless gum or sucking hard candy, and drinking plenty of water may help. Contact your doctor if the problem does not go away or is severe. What side effects may I notice from receiving this medicine? Side effects that you should report to your doctor or health care professional as soon as possible: -agitation, nervousness, confusion -difficulty swallowing -dizziness, drowsiness -fast or slow heartbeat -hallucinations -pain or difficulty passing urine Side effects that usually do not require medical attention (report to your doctor or health care professional if they continue or are bothersome): -constipation -headache -nausea or vomiting -sexual difficulty This list may not describe all possible side effects. Call your doctor for medical advice about side effects. You may report side effects to FDA at 1-800-FDA-1088. Where should I keep my medicine? Keep out of the reach of children. Store at room temperature below 30 degrees C (86 degrees F). Protect from light. Throw away any unused medicine after the expiration date.  NOTE: This sheet is a summary. It may not cover all possible information. If you have questions about this medicine, talk to your doctor, pharmacist, or health care provider.  2015, [employer]. (2008-03-20 17:12:34)

## 2015-09-25 ENCOUNTER — Emergency Department (HOSPITAL_COMMUNITY)
Admission: EM | Admit: 2015-09-25 | Discharge: 2015-09-26 | Disposition: A | Discharge status: Home / Self Care | Payer: BC Managed Care – PPO | Attending: Emergency Medicine | Admitting: Emergency Medicine

## 2015-09-25 ENCOUNTER — Encounter (HOSPITAL_COMMUNITY): Payer: Self-pay | Admitting: Emergency Medicine

## 2015-09-25 DIAGNOSIS — M5442 Lumbago with sciatica, left side: Secondary | ICD-10-CM | POA: Diagnosis not present

## 2015-09-25 DIAGNOSIS — K589 Irritable bowel syndrome without diarrhea: Secondary | ICD-10-CM | POA: Diagnosis not present

## 2015-09-25 DIAGNOSIS — Z79899 Other long term (current) drug therapy: Secondary | ICD-10-CM | POA: Diagnosis not present

## 2015-09-25 DIAGNOSIS — M069 Rheumatoid arthritis, unspecified: Secondary | ICD-10-CM | POA: Diagnosis not present

## 2015-09-25 DIAGNOSIS — J45909 Unspecified asthma, uncomplicated: Secondary | ICD-10-CM | POA: Insufficient documentation

## 2015-09-25 DIAGNOSIS — M545 Low back pain: Secondary | ICD-10-CM | POA: Diagnosis present

## 2015-09-25 DIAGNOSIS — M546 Pain in thoracic spine: Secondary | ICD-10-CM | POA: Insufficient documentation

## 2015-09-25 MED ORDER — HYDROMORPHONE HCL 1 MG/ML IJ SOLN
1.0000 mg | Freq: Once | INTRAMUSCULAR | Status: AC
  Administered 2015-09-26: 1 mg via INTRAMUSCULAR
  Filled 2015-09-25: qty 1

## 2015-09-25 MED ORDER — DIAZEPAM 5 MG PO TABS
5.0000 mg | ORAL_TABLET | Freq: Once | ORAL | Status: AC
  Administered 2015-09-26: 5 mg via ORAL
  Filled 2015-09-25: qty 1

## 2015-09-25 NOTE — ED Provider Notes (Signed)
CSN: [id number]     Arrival date & time 09/25/15  2242 History  By signing my name below, I, Helane Gunther, attest that this documentation has been prepared under the direction and in the presence of [employer], PA-C. Electronically Signed: Helane Gunther, ED Scribe. 09/25/2015. 11:55 PM.    Chief Complaint  Patient presents with  . Back Pain   The history is provided by the patient. No language interpreter was used.   HPI Comments: [name] is a 39 y.o. female with a PMHx of rheumtoid arthritis who presents to the Emergency Department complaining of mid lower back pain onset just PTA. Pt states she bent over to close up a folding chair when she felt a sharp pain in her back and could not move, after which she sat on the couch and felt pain shooting up her spine and radiating otuward. She states she then called her son to come and take her to the ED. She reports associated shooting pains down the left leg. She also notes increased urgency at baseline for the past few months, and states she feels as though she has to void immediately. She reports a PMHx of back pain, but not with the shooting pains down the leg. She notes her PCP prescribes diazepam and percocet for her rheumatoid arthritis pains. She states she has not taken anything for pain PTA. She denies recent trauma or injury. Pt denies numbness in her extremities, dysuria, and loss of bowel or bladder control.  Past Medical History  Diagnosis Date  . Asthma     diagnosed as  a young adult   . Rheumatoid arthritis(714.0)   . IBS (irritable bowel syndrome)    Past Surgical History  Procedure Laterality Date  . Hernia repair    . Abdominal hysterectomy    . Cystectomy    . Cholecystectomy    . Tubal ligation    . Breast reduction surgery    . Appendectomy     Family History  Problem Relation Age of Onset  . Rheum arthritis Father   . Breast cancer Father     died  . Lupus Maternal Aunt   . Rheum arthritis  Mother   . Rheum arthritis Maternal Grandmother   . Rheum arthritis Maternal Grandfather   . Rheum arthritis Paternal Grandfather   . Rheum arthritis Paternal Grandmother    Social History  Substance Use Topics  . Smoking status: Never Smoker   . Smokeless tobacco: Never Used  . Alcohol Use: No   OB History    No data available     Review of Systems  Genitourinary: Positive for urgency. Negative for dysuria.  Musculoskeletal: Positive for back pain.  Neurological: Negative for numbness.    Allergies  Bee venom; Diclofenac; Toradol; Metoclopramide; and Tramadol  Home Medications   Prior to Admission medications   Medication Sig Start Date End Date Taking? Authorizing Provider  acetaminophen (TYLENOL) 500 MG tablet Take 1,000 mg by mouth every 6 (six) hours as needed (for pain.).    Historical Provider, MD  albuterol (PROVENTIL HFA;VENTOLIN HFA) 108 (90 BASE) MCG/ACT inhaler Inhale 2 puffs into the lungs every 6 (six) hours as needed. For shortness of breath    Historical Provider, MD  ALPRAZolam (XANAX) 1 MG tablet Take 1 tablet (1 mg total) by mouth 3 (three) times daily as needed for anxiety. 01/19/15   Liam Graham, PA-C  dicyclomine (BENTYL) 20 MG tablet Take 1 tablet (20 mg total) by mouth  4 (four) times daily as needed for spasms. 1/61/09   Delora Fuel, MD  Propylene Glycol (SYSTANE BALANCE OP) Place 1 drop into both eyes daily as needed. For dry eyes    Historical Provider, MD   BP 123/72 mmHg  Pulse 92  Temp(Src) 98.9 F (37.2 C) (Oral)  Resp 18  SpO2 100% Physical Exam  Constitutional: She is oriented to person, place, and time. She appears well-developed and well-nourished.  HENT:  Head: Normocephalic.  Eyes: EOM are normal.  Neck: Normal range of motion.  Cardiovascular: Normal rate, regular rhythm and normal heart sounds.   Pulmonary/Chest: Effort normal and breath sounds normal. No respiratory distress. She has no wheezes.  Abdominal: She exhibits no  distension. There is no tenderness.  No CVA tenderness bilaterally  Musculoskeletal: Normal range of motion.  Midline thoracic and lumbar spine tenderness. Bilateral perivertebral tenderness. No pain with bilateral straight leg raise  Neurological: She is alert and oriented to person, place, and time.  5/5 and equal lower extremity strength. 2+ and equal patellar reflexes bilaterally. Pt able to dorsiflex bilateral toes and feet with good strength against resistance. Equal sensation bilaterally over thighs and lower legs.   Psychiatric: She has a normal mood and affect.  Nursing note and vitals reviewed.   ED Course  Procedures  DIAGNOSTIC STUDIES: Oxygen Saturation is 100% on RA, normal by my interpretation.    COORDINATION OF CARE: 11:53 PM - Discussed probable muscle spasms. Discussed plans to order urinalysis and pain medication. Pt advised of plan for treatment and pt agrees.  Labs Review Labs Reviewed - No data to display  Imaging Review No results found. I have personally reviewed and evaluated these images and lab results as part of my medical decision-making.   EKG Interpretation None      MDM   Final diagnoses:  Bilateral low back pain with left-sided sciatica   patient with acute onset of lower back pain that she states "shoots up and down entire back." Also reports some urinary frequency, will check urinalysis. Patient's exam shows midline and paraspinal tenderness over lumbar spine. Some tenderness of thoracic spine noted as well. Patient did not have any injuries, other than leaning forward, doubt fractures. Most likely muscle spasms versus disc herniation with nerve impingement. At this time no evidence of cauda equina or signs of cord compression. No emergent imaging indicated this time. Patient received 1 mg of Dilaudid IM, Percocet, Valium 5 mg by mouth. Patient had very little relief of her symptoms but she is able to ambulate and she does not appear to be in any  distress at this time. Platelet discharge home, Percocet, Valium, follow with primary care doctor. Urinalysis unremarkable. Vital signs are normal, she is afebrile. Stable for discharge home.  Filed Vitals:   09/25/15 2249 09/26/15 0100  BP: 123/72 124/70  Pulse: 92 90  Temp: 98.9 F (37.2 C) 98.5 F (36.9 C)  TempSrc: Oral   Resp: 18 19  SpO2: 100% 99%   I personally performed the services described in this documentation, which was scribed in my presence. The recorded information has been reviewed and is accurate.   Jeannett Senior, PA-C 09/26/15 0138  Jeannett Senior, PA-C 09/26/15 0138  Tanna Furry, MD 09/27/15 [phone]

## 2015-09-25 NOTE — ED Notes (Signed)
C/o thoracic back pain that started tonight when she slightly bent over to fold a lounge chair.  States a doctor told her approx 20 years ago that something was wrong with her spine but she hasn't had problems since then.

## 2015-09-26 LAB — URINALYSIS, ROUTINE W REFLEX MICROSCOPIC
Glucose, UA: NEGATIVE mg/dL
Hgb urine dipstick: NEGATIVE
KETONES UR: 15 mg/dL — AB
Leukocytes, UA: NEGATIVE
NITRITE: NEGATIVE
Protein, ur: NEGATIVE mg/dL
Specific Gravity, Urine: 1.035 — ABNORMAL HIGH (ref 1.005–1.030)
UROBILINOGEN UA: 1 mg/dL (ref 0.0–1.0)
pH: 5.5 (ref 5.0–8.0)

## 2015-09-26 MED ORDER — DIAZEPAM 5 MG PO TABS: 5.0000 mg | ORAL_TABLET | Freq: Four times a day (QID) | ORAL | Status: DC | PRN

## 2015-09-26 MED ORDER — OXYCODONE-ACETAMINOPHEN 5-325 MG PO TABS
1.0000 | ORAL_TABLET | Freq: Once | ORAL | Status: AC
  Administered 2015-09-26: 1 via ORAL
  Filled 2015-09-26: qty 1

## 2015-09-26 MED ORDER — OXYCODONE-ACETAMINOPHEN 5-325 MG PO TABS
1.0000 | ORAL_TABLET | ORAL | Status: DC | PRN
Start: 2015-09-26 — End: 2017-01-18

## 2015-09-26 NOTE — Discharge Instructions (Signed)
Percocet as prescribed as needed for pain. Valium for spasms. Try stretches. Start back exercises. Follow up with primary care doctor as soon as able. Return if worsening symptoms.    Back Pain, Adult Back pain is very common in adults.The cause of back pain is rarely dangerous and the pain often gets better over time.The cause of your back pain may not be known. Some common causes of back pain include: 1. Strain of the muscles or ligaments supporting the spine. 2. Wear and tear (degeneration) of the spinal disks. 3. Arthritis. 4. Direct injury to the back. For many people, back pain may return. Since back pain is rarely dangerous, most people can learn to manage this condition on their own. HOME CARE INSTRUCTIONS Watch your back pain for any changes. The following actions may help to lessen any discomfort you are feeling: 1. Remain active. It is stressful on your back to sit or stand in one place for long periods of time. Do not sit, drive, or stand in one place for more than 30 minutes at a time. Take short walks on even surfaces as soon as you are able.Try to increase the length of time you walk each day. 2. Exercise regularly as directed by your health care provider. Exercise helps your back heal faster. It also helps avoid future injury by keeping your muscles strong and flexible. 3. Do not stay in bed.Resting more than 1-2 days can delay your recovery. 4. Pay attention to your body when you bend and lift. The most comfortable positions are those that put less stress on your recovering back. Always use proper lifting techniques, including: 1. Bending your knees. 2. Keeping the load close to your body. 3. Avoiding twisting. 5. Find a comfortable position to sleep. Use a firm mattress and lie on your side with your knees slightly bent. If you lie on your back, put a pillow under your knees. 6. Avoid feeling anxious or stressed.Stress increases muscle tension and can worsen back pain.It  is important to recognize when you are anxious or stressed and learn ways to manage it, such as with exercise. 7. Take medicines only as directed by your health care provider. Over-the-counter medicines to reduce pain and inflammation are often the most helpful.Your health care provider may prescribe muscle relaxant drugs.These medicines help dull your pain so you can more quickly return to your normal activities and healthy exercise. 8. Apply ice to the injured area: 1. Put ice in a plastic bag. 2. Place a towel between your skin and the bag. 3. Leave the ice on for 20 minutes, 2-3 times a day for the first 2-3 days. After that, ice and heat may be alternated to reduce pain and spasms. 9. Maintain a healthy weight. Excess weight puts extra stress on your back and makes it difficult to maintain good posture. SEEK MEDICAL CARE IF: 1. You have pain that is not relieved with rest or medicine. 2. You have increasing pain going down into the legs or buttocks. 3. You have pain that does not improve in one week. 4. You have night pain. 5. You lose weight. 6. You have a fever or chills. SEEK IMMEDIATE MEDICAL CARE IF:  1. You develop new bowel or bladder control problems. 2. You have unusual weakness or numbness in your arms or legs. 3. You develop nausea or vomiting. 4. You develop abdominal pain. 5. You feel faint.   This information is not intended to replace advice given to you by your health  care provider. Make sure you discuss any questions you have with your health care provider.   Document Released: 11/30/2005 Document Revised: 12/21/2014 Document Reviewed: 04/03/2014 Elsevier Interactive Patient Education 2016 [employer].  Back Exercises If you have pain in your back, do these exercises 2-3 times each day or as told by your doctor. When the pain goes away, do the exercises once each day, but repeat the steps more times for each exercise (do more repetitions). If you do not have pain  in your back, do these exercises once each day or as told by your doctor. EXERCISES Single Knee to Chest Do these steps 3-5 times in a row for each leg: 5. Lie on your back on a firm bed or the floor with your legs stretched out. 6. Bring one knee to your chest. 7. Hold your knee to your chest by grabbing your knee or thigh. 8. Pull on your knee until you feel a gentle stretch in your lower back. 9. Keep doing the stretch for 10-30 seconds. 10. Slowly let go of your leg and straighten it. Pelvic Tilt Do these steps 5-10 times in a row: 10. Lie on your back on a firm bed or the floor with your legs stretched out. Walker your knees so they point up to the ceiling. Your feet should be flat on the floor. 12. Tighten your lower belly (abdomen) muscles to press your lower back against the floor. This will make your tailbone point up to the ceiling instead of pointing down to your feet or the floor. 13. Stay in this position for 5-10 seconds while you gently tighten your muscles and breathe evenly. Cat-Cow Do these steps until your lower back bends more easily: 7. Get on your hands and knees on a firm surface. Keep your hands under your shoulders, and keep your knees under your hips. You may put padding under your knees. 8. Let your head hang down, and make your tailbone point down to the floor so your lower back is round like the back of a cat. 9. Stay in this position for 5 seconds. 10. Slowly lift your head and make your tailbone point up to the ceiling so your back hangs low (sags) like the back of a cow. 11. Stay in this position for 5 seconds. Press-Ups Do these steps 5-10 times in a row: 6. Lie on your belly (face-down) on the floor. 7. Place your hands near your head, about shoulder-width apart. 8. While you keep your back relaxed and keep your hips on the floor, slowly straighten your arms to raise the top half of your body and lift your shoulders. Do not use your back muscles. To make  yourself more comfortable, you may change where you place your hands. 9. Stay in this position for 5 seconds. 10. Slowly return to lying flat on the floor. Bridges Do these steps 10 times in a row: 1. Lie on your back on a firm surface. 2. Bend your knees so they point up to the ceiling. Your feet should be flat on the floor. 3. Tighten your butt muscles and lift your butt off of the floor until your waist is almost as high as your knees. If you do not feel the muscles working in your butt and the back of your thighs, slide your feet 1-2 inches farther away from your butt. 4. Stay in this position for 3-5 seconds. 5. Slowly lower your butt to the floor, and let your butt muscles relax.  If this exercise is too easy, try doing it with your arms crossed over your chest. Belly Crunches Do these steps 5-10 times in a row: 1. Lie on your back on a firm bed or the floor with your legs stretched out. 2. Bend your knees so they point up to the ceiling. Your feet should be flat on the floor. 3. Cross your arms over your chest. 4. Tip your chin a little bit toward your chest but do not bend your neck. 5. Tighten your belly muscles and slowly raise your chest just enough to lift your shoulder blades a tiny bit off of the floor. 6. Slowly lower your chest and your head to the floor. Back Lifts Do these steps 5-10 times in a row: 1. Lie on your belly (face-down) with your arms at your sides, and rest your forehead on the floor. 2. Tighten the muscles in your legs and your butt. 3. Slowly lift your chest off of the floor while you keep your hips on the floor. Keep the back of your head in line with the curve in your back. Look at the floor while you do this. 4. Stay in this position for 3-5 seconds. 5. Slowly lower your chest and your face to the floor. GET HELP IF:  Your back pain gets a lot worse when you do an exercise.  Your back pain does not lessen 2 hours after you exercise. If you have any of  these problems, stop doing the exercises. Do not do them again unless your doctor says it is okay. GET HELP RIGHT AWAY IF:  You have sudden, very bad back pain. If this happens, stop doing the exercises. Do not do them again unless your doctor says it is okay.   This information is not intended to replace advice given to you by your health care provider. Make sure you discuss any questions you have with your health care provider.   Document Released: 01/02/2011 Document Revised: 08/21/2015 Document Reviewed: 01/24/2015 Elsevier Interactive Patient Education [employer].

## 2015-10-17 ENCOUNTER — Emergency Department (INDEPENDENT_AMBULATORY_CARE_PROVIDER_SITE_OTHER)
Admission: EM | Admit: 2015-10-17 | Discharge: 2015-10-17 | Disposition: A | Discharge status: Home / Self Care | Payer: BC Managed Care – PPO | Source: Home / Self Care

## 2015-10-17 ENCOUNTER — Encounter (HOSPITAL_COMMUNITY): Payer: Self-pay | Admitting: Emergency Medicine

## 2015-10-17 DIAGNOSIS — L03119 Cellulitis of unspecified part of limb: Secondary | ICD-10-CM | POA: Diagnosis not present

## 2015-10-17 MED ORDER — HYDROXYZINE HCL 25 MG PO TABS: 25.0000 mg | ORAL_TABLET | Freq: Four times a day (QID) | ORAL | Status: DC

## 2015-10-17 MED ORDER — SULFAMETHOXAZOLE-TRIMETHOPRIM 800-160 MG PO TABS: 1.0000 | ORAL_TABLET | Freq: Two times a day (BID) | ORAL | Status: AC

## 2015-10-17 NOTE — ED Notes (Signed)
Pt has swelling on her right forearm and on her right shoulder.  She thinks it may be an insect bite but she is not sure.  The pain in her right arm is progressing and so is the swelling from 3 days ago.

## 2015-10-17 NOTE — ED Provider Notes (Signed)
CSN: [id number]     Arrival date & time 10/17/15  1628 History   None    Chief Complaint  Patient presents with  . Insect Bite   (Consider location/radiation/quality/duration/timing/severity/associated sxs/prior Treatment) The history is provided by the patient.    Past Medical History  Diagnosis Date  . Asthma     diagnosed as  a young adult   . Rheumatoid arthritis(714.0)   . IBS (irritable bowel syndrome)    Past Surgical History  Procedure Laterality Date  . Hernia repair    . Abdominal hysterectomy    . Cystectomy    . Cholecystectomy    . Tubal ligation    . Breast reduction surgery    . Appendectomy     Family History  Problem Relation Age of Onset  . Rheum arthritis Father   . Breast cancer Father     died  . Lupus Maternal Aunt   . Rheum arthritis Mother   . Rheum arthritis Maternal Grandmother   . Rheum arthritis Maternal Grandfather   . Rheum arthritis Paternal Grandfather   . Rheum arthritis Paternal Grandmother    Social History  Substance Use Topics  . Smoking status: Never Smoker   . Smokeless tobacco: Never Used  . Alcohol Use: No   OB History    No data available     Review of Systems  Constitutional: Negative.   HENT: Negative.   Eyes: Negative.   Respiratory: Negative.   Cardiovascular: Negative.   Endocrine: Negative.   Genitourinary: Negative.   Musculoskeletal: Negative.   Skin: Positive for rash.       Possible bite from unknown insect/spider.  Allergic/Immunologic: Negative.   Neurological: Negative.   Hematological: Negative.   Psychiatric/Behavioral: Negative.     Allergies  Bee venom; Diclofenac; Toradol; Metoclopramide; and Tramadol  Home Medications   Prior to Admission medications   Medication Sig Start Date End Date Taking? Authorizing Provider  acetaminophen (TYLENOL) 500 MG tablet Take 1,000 mg by mouth every 6 (six) hours as needed (for pain.).   Yes Historical Provider, MD  Propylene Glycol (SYSTANE BALANCE  OP) Place 1 drop into both eyes daily as needed. For dry eyes   Yes Historical Provider, MD  albuterol (PROVENTIL HFA;VENTOLIN HFA) 108 (90 BASE) MCG/ACT inhaler Inhale 2 puffs into the lungs every 6 (six) hours as needed. For shortness of breath    Historical Provider, MD  ALPRAZolam (XANAX) 1 MG tablet Take 1 tablet (1 mg total) by mouth 3 (three) times daily as needed for anxiety. 01/19/15   Liam Graham, PA-C  diazepam (VALIUM) 5 MG tablet Take 1 tablet (5 mg total) by mouth every 6 (six) hours as needed for anxiety or muscle spasms. 09/26/15   Tatyana Kirichenko, PA-C  dicyclomine (BENTYL) 20 MG tablet Take 1 tablet (20 mg total) by mouth 4 (four) times daily as needed for spasms. 06/24/44   Delora Fuel, MD  hydrOXYzine (ATARAX/VISTARIL) 25 MG tablet Take 1 tablet (25 mg total) by mouth every 6 (six) hours. 10/17/15   Lysbeth Penner, FNP  oxyCODONE-acetaminophen (PERCOCET) 5-325 MG tablet Take 1 tablet by mouth every 4 (four) hours as needed for severe pain. 09/26/15   Tatyana Kirichenko, PA-C  sulfamethoxazole-trimethoprim (BACTRIM DS,SEPTRA DS) 800-160 MG tablet Take 1 tablet by mouth 2 (two) times daily. 10/17/15 10/24/15  Lysbeth Penner, FNP   Meds Ordered and Administered this Visit  Medications - No data to display  BP 147/89 mmHg  Pulse 71  Temp(Src) 98.3 F (36.8 C) (Oral)  Resp 16  SpO2 100% No data found.   Physical Exam  Constitutional: She appears well-developed and well-nourished.  HENT:  Head: Normocephalic and atraumatic.  Right Ear: External ear normal.  Left Ear: External ear normal.  Mouth/Throat: Oropharynx is clear and moist.  Eyes: Conjunctivae and EOM are normal. Pupils are equal, round, and reactive to light.  Neck: Normal range of motion. Neck supple.  Cardiovascular: Normal rate, regular rhythm and normal heart sounds.   Pulmonary/Chest: Effort normal and breath sounds normal.  Abdominal: Soft. Bowel sounds are normal.  Skin: Rash noted. There is  erythema.  Right wrist ventral side with erythema and swelling approximately 8 cm x 6 cm and with central puncture site w/o discharge.    ED Course  Procedures (including critical care time)  Labs Review Labs Reviewed - No data to display  Imaging Review No results found.   Visual Acuity Review  Right Eye Distance:   Left Eye Distance:   Bilateral Distance:    Right Eye Near:   Left Eye Near:    Bilateral Near:         MDM   1. Cellulitis of wrist   Area of wrist is marked and patient instructed that if the cellulitis does not decrease then follow up.  If the cellulitis increases and discomfort increases then recommend follow up at the ED promptly. Recommend tylenol and motrin otc as directed.  Bactrim DS one po bid x 10 days #20 Atarax 25mg  one po q 6 hours prn itch #12  Orson Ape Johncarlo Maal[place]f [place], FNP 10/17/15 [phone]

## 2015-12-26 ENCOUNTER — Emergency Department (HOSPITAL_COMMUNITY)
Admission: EM | Admit: 2015-12-26 | Discharge: 2015-12-27 | Disposition: A | Discharge status: Home / Self Care | Payer: BC Managed Care – PPO | Attending: Emergency Medicine | Admitting: Emergency Medicine

## 2015-12-26 ENCOUNTER — Emergency Department (HOSPITAL_COMMUNITY): Payer: BC Managed Care – PPO

## 2015-12-26 ENCOUNTER — Encounter (HOSPITAL_COMMUNITY): Payer: Self-pay | Admitting: Nurse Practitioner

## 2015-12-26 DIAGNOSIS — Z79899 Other long term (current) drug therapy: Secondary | ICD-10-CM | POA: Insufficient documentation

## 2015-12-26 DIAGNOSIS — R234 Changes in skin texture: Secondary | ICD-10-CM | POA: Insufficient documentation

## 2015-12-26 DIAGNOSIS — Y92410 Unspecified street and highway as the place of occurrence of the external cause: Secondary | ICD-10-CM | POA: Diagnosis not present

## 2015-12-26 DIAGNOSIS — S3992XA Unspecified injury of lower back, initial encounter: Secondary | ICD-10-CM | POA: Diagnosis not present

## 2015-12-26 DIAGNOSIS — K589 Irritable bowel syndrome without diarrhea: Secondary | ICD-10-CM | POA: Diagnosis not present

## 2015-12-26 DIAGNOSIS — S299XXA Unspecified injury of thorax, initial encounter: Secondary | ICD-10-CM | POA: Diagnosis present

## 2015-12-26 DIAGNOSIS — Y9389 Activity, other specified: Secondary | ICD-10-CM | POA: Insufficient documentation

## 2015-12-26 DIAGNOSIS — Z8739 Personal history of other diseases of the musculoskeletal system and connective tissue: Secondary | ICD-10-CM | POA: Diagnosis not present

## 2015-12-26 DIAGNOSIS — Y998 Other external cause status: Secondary | ICD-10-CM | POA: Diagnosis not present

## 2015-12-26 DIAGNOSIS — J45901 Unspecified asthma with (acute) exacerbation: Secondary | ICD-10-CM | POA: Insufficient documentation

## 2015-12-26 DIAGNOSIS — M62830 Muscle spasm of back: Secondary | ICD-10-CM | POA: Diagnosis not present

## 2015-12-26 MED ORDER — ALBUTEROL SULFATE (2.5 MG/3ML) 0.083% IN NEBU
5.0000 mg | INHALATION_SOLUTION | Freq: Once | RESPIRATORY_TRACT | Status: AC
  Administered 2015-12-26: 5 mg via RESPIRATORY_TRACT

## 2015-12-26 MED ORDER — OXYCODONE-ACETAMINOPHEN 5-325 MG PO TABS
1.0000 | ORAL_TABLET | Freq: Once | ORAL | Status: AC
  Administered 2015-12-26: 1 via ORAL

## 2015-12-26 MED ORDER — OXYCODONE-ACETAMINOPHEN 5-325 MG PO TABS
ORAL_TABLET | ORAL | Status: AC
  Filled 2015-12-26: qty 1

## 2015-12-26 MED ORDER — ALBUTEROL SULFATE (2.5 MG/3ML) 0.083% IN NEBU
INHALATION_SOLUTION | RESPIRATORY_TRACT | Status: AC
  Filled 2015-12-26: qty 6
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
## 2015-12-27 ENCOUNTER — Encounter (HOSPITAL_COMMUNITY): Payer: Self-pay | Admitting: Emergency Medicine

## 2015-12-27 ENCOUNTER — Emergency Department (HOSPITAL_COMMUNITY): Payer: BC Managed Care – PPO

## 2015-12-27 DIAGNOSIS — S299XXA Unspecified injury of thorax, initial encounter: Secondary | ICD-10-CM | POA: Diagnosis not present

## 2015-12-27 LAB — CBC WITH DIFFERENTIAL/PLATELET
Basophils Absolute: 0 10*3/uL (ref 0.0–0.1)
Basophils Relative: 0 %
EOS PCT: 0 %
Eosinophils Absolute: 0 10*3/uL (ref 0.0–0.7)
HCT: 37.6 % (ref 36.0–46.0)
Hemoglobin: 12.3 g/dL (ref 12.0–15.0)
LYMPHS ABS: 2.1 10*3/uL (ref 0.7–4.0)
LYMPHS PCT: 37 %
MCH: 29.6 pg (ref 26.0–34.0)
MCHC: 32.7 g/dL (ref 30.0–36.0)
MCV: 90.6 fL (ref 78.0–100.0)
MONO ABS: 0.5 10*3/uL (ref 0.1–1.0)
Monocytes Relative: 8 %
NEUTROS ABS: 3.1 10*3/uL (ref 1.7–7.7)
Neutrophils Relative %: 55 %
PLATELETS: 276 10*3/uL (ref 150–400)
RBC: 4.15 MIL/uL (ref 3.87–5.11)
RDW: 13.3 % (ref 11.5–15.5)
WBC: 5.6 10*3/uL (ref 4.0–10.5)

## 2015-12-27 LAB — I-STAT CHEM 8, ED
BUN: 7 mg/dL (ref 6–20)
CALCIUM ION: 1.11 mmol/L — AB (ref 1.12–1.23)
CHLORIDE: 100 mmol/L — AB (ref 101–111)
Creatinine, Ser: 0.7 mg/dL (ref 0.44–1.00)
GLUCOSE: 113 mg/dL — AB (ref 65–99)
HCT: 41 % (ref 36.0–46.0)
HEMOGLOBIN: 13.9 g/dL (ref 12.0–15.0)
POTASSIUM: 3.7 mmol/L (ref 3.5–5.1)
SODIUM: 137 mmol/L (ref 135–145)
TCO2: 25 mmol/L (ref 0–100)

## 2015-12-27 LAB — I-STAT TROPONIN, ED: TROPONIN I, POC: 0 ng/mL (ref 0.00–0.08)

## 2015-12-27 MED ORDER — METHOCARBAMOL 500 MG PO TABS
1000.0000 mg | ORAL_TABLET | Freq: Once | ORAL | Status: AC
  Administered 2015-12-27: 1000 mg via ORAL
  Filled 2015-12-27: qty 2

## 2015-12-27 MED ORDER — METHOCARBAMOL 500 MG PO TABS: 500.0000 mg | ORAL_TABLET | Freq: Two times a day (BID) | ORAL | Status: DC

## 2015-12-27 MED ORDER — PREDNISONE 20 MG PO TABS: ORAL_TABLET | ORAL | Status: DC

## 2015-12-27 MED ORDER — ALBUTEROL SULFATE HFA 108 (90 BASE) MCG/ACT IN AERS: 1.0000 | INHALATION_SPRAY | Freq: Four times a day (QID) | RESPIRATORY_TRACT | Status: DC | PRN

## 2015-12-27 NOTE — Discharge Instructions (Signed)

## 2016-09-12 ENCOUNTER — Encounter (HOSPITAL_COMMUNITY): Payer: Self-pay | Admitting: *Deleted

## 2016-09-12 ENCOUNTER — Ambulatory Visit (HOSPITAL_COMMUNITY)
Admission: EM | Admit: 2016-09-12 | Discharge: 2016-09-12 | Disposition: A | Discharge status: Home / Self Care | Payer: BC Managed Care – PPO | Attending: Family Medicine | Admitting: Family Medicine

## 2016-09-12 DIAGNOSIS — K0889 Other specified disorders of teeth and supporting structures: Secondary | ICD-10-CM

## 2016-09-12 DIAGNOSIS — J069 Acute upper respiratory infection, unspecified: Secondary | ICD-10-CM | POA: Diagnosis not present

## 2016-09-12 MED ORDER — AMOXICILLIN 500 MG PO CAPS: 500.0000 mg | ORAL_CAPSULE | Freq: Three times a day (TID) | ORAL | 0 refills | Status: DC

## 2016-09-12 MED ORDER — IPRATROPIUM BROMIDE 0.06 % NA SOLN: 2.0000 | Freq: Four times a day (QID) | NASAL | 1 refills | Status: DC

## 2016-09-12 NOTE — ED Triage Notes (Signed)
Pt  Reports      Symptoms  Of     Chest   Congestion      Cough   Headache      sorethroat    And  Fever           Symptoms  Started   Yesterday     Pt    Reports   A  History  Of  Asthma      Pt  Reports  Abdominal  Pain  As   Well

## 2016-09-12 NOTE — ED Provider Notes (Signed)
[place]    CSN: [id number] Arrival date & time: 09/12/16  1509     History   Chief Complaint Chief Complaint  Patient presents with  . URI    HPI [name] is a 40 y.o. female.   The history is provided by the patient.  URI  Presenting symptoms: congestion, cough, rhinorrhea and sore throat   Presenting symptoms: no fever   Severity:  Mild Onset quality:  Gradual Duration:  2 days Progression:  Unchanged Chronicity:  New Relieved by:  None tried Ineffective treatments:  None tried Risk factors: sick contacts     Past Medical History:  Diagnosis Date  . Asthma    diagnosed as  a young adult   . IBS (irritable bowel syndrome)   . Rheumatoid arthritis(714.0)     Patient Active Problem List   Diagnosis Date Noted  . Systemic viral illness 11/13/2012  . Morbid obesity with BMI of 40.0-44.9, adult ([place]) 11/13/2012  . Tachycardia 11/13/2012  . Sinusitis, acute frontal 11/13/2012  . Rheumatoid arthritis(714.0)   . Asthma     Past Surgical History:  Procedure Laterality Date  . ABDOMINAL HYSTERECTOMY    . APPENDECTOMY    . BREAST REDUCTION SURGERY    . CHOLECYSTECTOMY    . CYSTECTOMY    . HERNIA REPAIR    . TUBAL LIGATION      OB History    No data available       Home Medications    Prior to Admission medications   Medication Sig Start Date End Date Taking? Authorizing Provider  albuterol (PROVENTIL HFA;VENTOLIN HFA) 108 (90 BASE) MCG/ACT inhaler Inhale 2 puffs into the lungs every 6 (six) hours as needed. For shortness of breath    Historical Provider, MD  albuterol (PROVENTIL HFA;VENTOLIN HFA) 108 (90 Base) MCG/ACT inhaler Inhale 1-2 puffs into the lungs every 6 (six) hours as needed for wheezing or shortness of breath. 12/27/15   April Palumbo, MD  ALPRAZolam Duanne Moron) 1 MG tablet Take 1 tablet (1 mg total) by mouth 3 (three) times daily as needed for anxiety. Patient not taking: Reported on 12/27/2015 01/19/15   Liam Graham,  PA-C  diazepam (VALIUM) 5 MG tablet Take 1 tablet (5 mg total) by mouth every 6 (six) hours as needed for anxiety or muscle spasms. Patient not taking: Reported on 12/27/2015 09/26/15   Tatyana Kirichenko, PA-C  dicyclomine (BENTYL) 20 MG tablet Take 1 tablet (20 mg total) by mouth 4 (four) times daily as needed for spasms. Patient not taking: Reported on 12/27/2015 [id number]   Delora Fuel, MD  hydrOXYzine (ATARAX/VISTARIL) 25 MG tablet Take 1 tablet (25 mg total) by mouth every 6 (six) hours. Patient not taking: Reported on 12/27/2015 10/17/15   Lysbeth Penner, FNP  methocarbamol (ROBAXIN) 500 MG tablet Take 1 tablet (500 mg total) by mouth 2 (two) times daily. 12/27/15   April Palumbo, MD  oxyCODONE-acetaminophen (PERCOCET) 5-325 MG tablet Take 1 tablet by mouth every 4 (four) hours as needed for severe pain. Patient not taking: Reported on 12/27/2015 09/26/15   Jeannett Senior, PA-C  predniSONE (DELTASONE) 20 MG tablet 3 tabs po day one, then 2 po daily x 4 days 12/27/15   April Palumbo, MD    Family History Family History  Problem Relation Age of Onset  . Rheum arthritis Father   . Breast cancer Father     died  . Lupus Maternal Aunt   . Rheum arthritis Mother   .  Rheum arthritis Maternal Grandmother   . Rheum arthritis Maternal Grandfather   . Rheum arthritis Paternal Grandfather   . Rheum arthritis Paternal Grandmother     Social History Social History  Substance Use Topics  . Smoking status: Never Smoker  . Smokeless tobacco: Never Used  . Alcohol use No     Allergies   Bee venom; Diclofenac; Toradol [ketorolac tromethamine]; Metoclopramide; and Tramadol   Review of Systems Review of Systems  Constitutional: Negative.  Negative for fever.  HENT: Positive for congestion, dental problem, postnasal drip, rhinorrhea and sore throat.   Respiratory: Positive for cough.   Cardiovascular: Negative.   Gastrointestinal: Negative.   All other systems reviewed and are  negative.    Physical Exam Triage Vital Signs ED Triage Vitals  Enc Vitals Group     BP 09/12/16 1531 119/80     Pulse Rate 09/12/16 1531 77     Resp --      Temp 09/12/16 1531 98.5 F (36.9 C)     Temp Source 09/12/16 1531 Oral     SpO2 09/12/16 1531 97 %     Weight --      Height --      Head Circumference --      Peak Flow --      Pain Score 09/12/16 1540 5     Pain Loc --      Pain Edu? --      Excl. in [place]? --    No data found.   Updated Vital Signs BP 119/80 (BP Location: Left Arm)   Pulse 77   Temp 98.5 F (36.9 C) (Oral)   SpO2 97%   Visual Acuity Right Eye Distance:   Left Eye Distance:   Bilateral Distance:    Right Eye Near:   Left Eye Near:    Bilateral Near:     Physical Exam  Constitutional: She is oriented to person, place, and time. She appears well-developed and well-nourished.  HENT:  Right Ear: External ear normal.  Left Ear: External ear normal.  Mouth/Throat: Posterior oropharyngeal edema and posterior oropharyngeal erythema present.    Eyes: Conjunctivae and EOM are normal. Pupils are equal, round, and reactive to light.  Cardiovascular: Normal rate and regular rhythm.   Pulmonary/Chest: Effort normal.  Lymphadenopathy:    She has no cervical adenopathy.  Neurological: She is alert and oriented to person, place, and time.  Skin: Skin is warm and dry.  Nursing note and vitals reviewed.    UC Treatments / Results  Labs (all labs ordered are listed, but only abnormal results are displayed) Labs Reviewed - No data to display  EKG  EKG Interpretation None       Radiology No results found.  Procedures Procedures (including critical care time)  Medications Ordered in UC Medications - No data to display   Initial Impression / Assessment and Plan / UC Course  I have reviewed the triage vital signs and the nursing notes.  Pertinent labs & imaging results that were available during my care of the patient were reviewed by  me and considered in my medical decision making (see chart for details).  Clinical Course      Final Clinical Impressions(s) / UC Diagnoses   Final diagnoses:  None    New Prescriptions New Prescriptions   No medications on file     Billy Fischer, MD 09/12/16 [phone]

## 2016-12-03 ENCOUNTER — Encounter: Payer: Self-pay | Admitting: Family Medicine

## 2017-01-18 ENCOUNTER — Ambulatory Visit (INDEPENDENT_AMBULATORY_CARE_PROVIDER_SITE_OTHER): Payer: BC Managed Care – PPO

## 2017-01-18 ENCOUNTER — Encounter (HOSPITAL_COMMUNITY): Payer: Self-pay | Admitting: Emergency Medicine

## 2017-01-18 ENCOUNTER — Ambulatory Visit (HOSPITAL_COMMUNITY)
Admission: EM | Admit: 2017-01-18 | Discharge: 2017-01-18 | Disposition: A | Discharge status: Home / Self Care | Payer: BC Managed Care – PPO | Attending: Internal Medicine | Admitting: Internal Medicine

## 2017-01-18 DIAGNOSIS — R0789 Other chest pain: Secondary | ICD-10-CM

## 2017-01-18 DIAGNOSIS — R0609 Other forms of dyspnea: Secondary | ICD-10-CM | POA: Diagnosis not present

## 2017-01-18 DIAGNOSIS — R0982 Postnasal drip: Secondary | ICD-10-CM

## 2017-01-18 DIAGNOSIS — J069 Acute upper respiratory infection, unspecified: Secondary | ICD-10-CM

## 2017-01-18 DIAGNOSIS — R05 Cough: Secondary | ICD-10-CM

## 2017-01-18 DIAGNOSIS — R059 Cough, unspecified: Secondary | ICD-10-CM

## 2017-01-18 NOTE — ED Provider Notes (Signed)
CSN: [id number]     Arrival date & time 01/18/17  1733 History   First MD Initiated Contact with Patient 01/18/17 1932     Chief Complaint  Patient presents with  . Cough   (Consider location/radiation/quality/duration/timing/severity/associated sxs/prior Treatment) 41 year old female states she is having some trouble breathing and. She also has copious amount of PND and is an asthmatic. She has been having occasional problems with her asthma but for the most part has been under good control. She uses albuterol HFA. She has had one episode of vomiting. Denies fever or chills. She is also complaining of chest wall pain particularly to the left lower costal margin.      Past Medical History:  Diagnosis Date  . Asthma    diagnosed as  a young adult   . IBS (irritable bowel syndrome)   . Rheumatoid arthritis(714.0)    Past Surgical History:  Procedure Laterality Date  . ABDOMINAL HYSTERECTOMY    . APPENDECTOMY    . BREAST REDUCTION SURGERY    . CHOLECYSTECTOMY    . CYSTECTOMY    . HERNIA REPAIR    . TUBAL LIGATION     Family History  Problem Relation Age of Onset  . Rheum arthritis Father   . Breast cancer Father     died  . Lupus Maternal Aunt   . Rheum arthritis Mother   . Rheum arthritis Maternal Grandmother   . Rheum arthritis Maternal Grandfather   . Rheum arthritis Paternal Grandfather   . Rheum arthritis Paternal Grandmother    Social History  Substance Use Topics  . Smoking status: Never Smoker  . Smokeless tobacco: Never Used  . Alcohol use No   OB History    No data available     Review of Systems  Constitutional: Positive for activity change and fever. Negative for appetite change, chills and fatigue.  HENT: Positive for congestion and postnasal drip. Negative for facial swelling, rhinorrhea and sore throat.   Eyes: Negative.   Respiratory: Positive for cough and shortness of breath.   Cardiovascular: Negative.   Musculoskeletal: Negative for neck pain  and neck stiffness.  Skin: Negative for pallor and rash.  Neurological: Negative.   All other systems reviewed and are negative.   Allergies  Bee venom; Diclofenac; Toradol [ketorolac tromethamine]; Metoclopramide; and Tramadol  Home Medications   Prior to Admission medications   Medication Sig Start Date End Date Taking? Authorizing Provider  albuterol (PROVENTIL HFA;VENTOLIN HFA) 108 (90 BASE) MCG/ACT inhaler Inhale 2 puffs into the lungs every 6 (six) hours as needed. For shortness of breath   Yes Historical Provider, MD   Meds Ordered and Administered this Visit  Medications - No data to display  BP 125/74 (BP Location: Right Arm)   Pulse 80   Temp 98.4 F (36.9 C) (Oral)   Resp 20   SpO2 98%  No data found.   Physical Exam  Constitutional: She is oriented to person, place, and time. She appears well-developed and well-nourished.  HENT:  Head: Normocephalic and atraumatic.  Mouth/Throat: No oropharyngeal exudate.  Bilateral TMs are normal. Oropharynx with minor erythema and moderate amount of clear PND. No exudates  Eyes: EOM are normal.  Neck: Normal range of motion. Neck supple.  Cardiovascular: Normal rate, regular rhythm and normal heart sounds.   Pulmonary/Chest: Effort normal and breath sounds normal. No respiratory distress. She has no wheezes. She has no rales.  Lungs with good expansion and air movement. No wheezing auscultated. Inspiratory equals  expiratory.  Musculoskeletal: Normal range of motion. She exhibits no edema.  Lymphadenopathy:    She has no cervical adenopathy.  Neurological: She is alert and oriented to person, place, and time.  Skin: Skin is warm and dry.  Psychiatric: She has a normal mood and affect.  Nursing note and vitals reviewed.   Urgent Care Course     Procedures (including critical care time)  Labs Review Labs Reviewed - No data to display  Imaging Review Dg Chest 2 View  Result Date: 01/18/2017 CLINICAL DATA:  Dyspnea,  cough EXAM: CHEST  2 VIEW COMPARISON:  12/26/2015 FINDINGS: The heart size and mediastinal contours are within normal limits. Both lungs are clear. The visualized skeletal structures are unremarkable. IMPRESSION: No active cardiopulmonary disease. Electronically Signed   By: Kathreen Devoid   On: 01/18/2017 20:11     Visual Acuity Review  Right Eye Distance:   Left Eye Distance:   Bilateral Distance:    Right Eye Near:   Left Eye Near:    Bilateral Near:         MDM   1. Cough   2. PND (post-nasal drip)   3. Other form of dyspnea   4. Acute upper respiratory infection   5. Chest wall pain    On this visit your lungs are clear in your chest x-ray is clear. No signs of infection. He did have a lot of drainage going down the back of your throat which causes choking, cough, drying of the throat and increased phlegm. Recommend taking either Allegra or Zyrtec daily to help minimize drainage. If this is insufficient he may add a medicine called Chlor-Trimeton, comes in generic, 4 mg or breaking into half and take 2 mg at night time. Use lots of saline nasal spray he cannot overdose on this. Trach a glass of water around this time every night before going to bed and early in the morning. Continue to take sips of water during the day to keep the drainage off of your throat. You may also want to use some lozenges such as Cepacol or Hall's. If you feel that you may have wheezing certainly use your inhalers as needed. The pain in your chest is due to inflammation of the cartilage, this is not serious but aggravating. Treat this with local ice compresses and ibuprofen or Aleve can help with inflammation.    Janne Napoleon, NP 01/18/17 2042

## 2017-01-18 NOTE — Discharge Instructions (Signed)
On this visit your lungs are clear in your chest x-ray is clear. No signs of infection. He did have a lot of drainage going down the back of your throat which causes choking, cough, drying of the throat and increased phlegm. Recommend taking either Allegra or Zyrtec daily to help minimize drainage. If this is insufficient he may add a medicine called Chlor-Trimeton, comes in generic, 4 mg or breaking into half and take 2 mg at night time. Use lots of saline nasal spray he cannot overdose on this. Trach a glass of water around this time every night before going to bed and early in the morning. Continue to take sips of water during the day to keep the drainage off of your throat. You may also want to use some lozenges such as Cepacol or Hall's. If you feel that you may have wheezing certainly use your inhalers as needed. The pain in your chest is due to inflammation of the cartilage, this is not serious but aggravating. Treat this with local ice compresses and ibuprofen or Aleve can help with inflammation.

## 2017-01-18 NOTE — ED Notes (Signed)
Pt. Stated, I've been sick on my stomach, and I've taken everything under the counter. Used my inhaler.

## 2017-01-18 NOTE — ED Triage Notes (Signed)
The patient presented to the [facility] with a complaint of a cough, congestion and shortness of breath x 3 weeks. The patient reported using OTC meds.

## 2017-01-18 NOTE — ED Notes (Signed)
Cough for 3 weeks.

## 2017-07-01 ENCOUNTER — Emergency Department (HOSPITAL_COMMUNITY)
Admission: EM | Admit: 2017-07-01 | Discharge: 2017-07-01 | Disposition: A | Discharge status: Home / Self Care | Payer: BC Managed Care – PPO | Attending: Emergency Medicine | Admitting: Emergency Medicine

## 2017-07-01 ENCOUNTER — Encounter (HOSPITAL_COMMUNITY): Payer: Self-pay | Admitting: Emergency Medicine

## 2017-07-01 DIAGNOSIS — J45909 Unspecified asthma, uncomplicated: Secondary | ICD-10-CM | POA: Diagnosis not present

## 2017-07-01 DIAGNOSIS — M5410 Radiculopathy, site unspecified: Secondary | ICD-10-CM | POA: Insufficient documentation

## 2017-07-01 DIAGNOSIS — M545 Low back pain: Secondary | ICD-10-CM | POA: Diagnosis present

## 2017-07-01 DIAGNOSIS — Z9049 Acquired absence of other specified parts of digestive tract: Secondary | ICD-10-CM | POA: Insufficient documentation

## 2017-07-01 DIAGNOSIS — M5440 Lumbago with sciatica, unspecified side: Secondary | ICD-10-CM | POA: Diagnosis not present

## 2017-07-01 DIAGNOSIS — G8929 Other chronic pain: Secondary | ICD-10-CM

## 2017-07-01 MED ORDER — DEXAMETHASONE SODIUM PHOSPHATE 10 MG/ML IJ SOLN
10.0000 mg | Freq: Once | INTRAMUSCULAR | Status: AC
  Administered 2017-07-01: 10 mg via INTRAMUSCULAR
  Filled 2017-07-01: qty 1

## 2017-07-01 MED ORDER — METHOCARBAMOL 500 MG PO TABS: 500.0000 mg | ORAL_TABLET | Freq: Two times a day (BID) | ORAL | 0 refills | Status: DC

## 2017-07-01 MED ORDER — HYDROMORPHONE HCL 1 MG/ML IJ SOLN
1.0000 mg | Freq: Once | INTRAMUSCULAR | Status: AC
  Administered 2017-07-01: 1 mg via INTRAMUSCULAR
  Filled 2017-07-01: qty 1

## 2017-07-01 MED ORDER — HYDROCODONE-ACETAMINOPHEN 5-325 MG PO TABS: 1.0000 | ORAL_TABLET | Freq: Four times a day (QID) | ORAL | 0 refills | Status: DC | PRN

## 2017-07-01 MED ORDER — METHOCARBAMOL 500 MG PO TABS
500.0000 mg | ORAL_TABLET | Freq: Once | ORAL | Status: AC
  Administered 2017-07-01: 500 mg via ORAL
  Filled 2017-07-01: qty 1

## 2017-07-01 MED ORDER — PREDNISONE 20 MG PO TABS: ORAL_TABLET | ORAL | 0 refills | Status: DC

## 2017-07-01 MED ORDER — OXYCODONE-ACETAMINOPHEN 5-325 MG PO TABS
2.0000 | ORAL_TABLET | Freq: Once | ORAL | Status: AC
  Administered 2017-07-01: 2 via ORAL
  Filled 2017-07-01: qty 2

## 2017-07-01 NOTE — Discharge Instructions (Signed)
1. Medications: robaxin, prednisone, vicodin, usual home medications 2. Treatment: rest, drink plenty of fluids, gentle stretching as discussed, alternate ice and heat 3. Follow Up: Please followup with your primary doctor and neurosurgery in 3 days for discussion of your diagnoses and further evaluation after today's visit; if you do not have a primary care doctor use the resource guide provided to find one;  Return to the ER for worsening back pain, difficulty walking, loss of bowel or bladder control or other concerning symptoms

## 2017-07-01 NOTE — ED Triage Notes (Signed)
Pt presents to ED for assessment of lower back pain acute on chronic from protruding discs, degenerative bone disease, and "acute arthritis".  Pt seen by orthopedic a few months ago for same.

## 2017-07-01 NOTE — ED Provider Notes (Signed)
[place] DEPT Provider Note   CSN: [id number] Arrival date & time: 07/01/17  0037     History   Chief Complaint Chief Complaint  Patient presents with  . Back Pain    HPI [name] is a 41 y.o. female with a hx of arthritis and DDD presents to the Emergency Department complaining of gradual, persistent, progressively worsening acute on chronic back pain onset this morning.  She reports she sat in the rocking chair all day, but was able to rise on her own, dress herself and ambulate into the ED this evening.  She reports that earlier in the day she was unable to move from the rocking chair due to her pain.  She reports pain is worst in her lower back.  Denies falls or known trauma.  Pt reports no relief with ibuprofen.  Movement, especially ebnding makes the pain worse.  Pt reports radiation of the pain into the bilateral arms and bilateral legs which is normal for her. Pt is followed by Dr. Rolena Infante, Antionette Char and has had several low back procedures though she is unable to clarify.  Denies loss of bowel or bladder control.  No hx of cancer, IVDU.     The history is provided by the patient and medical records. No language interpreter was used.    Past Medical History:  Diagnosis Date  . Asthma    diagnosed as  a young adult   . IBS (irritable bowel syndrome)   . Rheumatoid arthritis(714.0)     Patient Active Problem List   Diagnosis Date Noted  . Systemic viral illness 11/13/2012  . Morbid obesity with BMI of 40.0-44.9, adult ([place]) 11/13/2012  . Tachycardia 11/13/2012  . Sinusitis, acute frontal 11/13/2012  . Rheumatoid arthritis(714.0)   . Asthma     Past Surgical History:  Procedure Laterality Date  . ABDOMINAL HYSTERECTOMY    . APPENDECTOMY    . BREAST REDUCTION SURGERY    . CHOLECYSTECTOMY    . CYSTECTOMY    . HERNIA REPAIR    . TUBAL LIGATION      OB History    No data available       Home Medications    Prior to Admission medications    Medication Sig Start Date End Date Taking? Authorizing Provider  albuterol (PROVENTIL HFA;VENTOLIN HFA) 108 (90 BASE) MCG/ACT inhaler Inhale 2 puffs into the lungs every 6 (six) hours as needed. For shortness of breath    [provider]  HYDROcodone-acetaminophen (NORCO/VICODIN) 5-325 MG tablet Take 1-2 tablets by mouth every 6 (six) hours as needed. 07/01/17   Walker Paddack, Jarrett Soho, PA-C  methocarbamol (ROBAXIN) 500 MG tablet Take 1 tablet (500 mg total) by mouth 2 (two) times daily. 07/01/17   Barton Want, Jarrett Soho, PA-C  predniSONE (DELTASONE) 20 MG tablet 3 tabs po daily x 3 days, then 2 tabs x 3 days, then 1.5 tabs x 3 days, then 1 tab x 3 days, then 0.5 tabs x 3 days 07/01/17   Yoshiaki Kreuser, Jarrett Soho, PA-C    Family History Family History  Problem Relation Age of Onset  . Rheum arthritis Father   . Breast cancer Father        died  . Lupus Maternal Aunt   . Rheum arthritis Mother   . Rheum arthritis Maternal Grandmother   . Rheum arthritis Maternal Grandfather   . Rheum arthritis Paternal Grandfather   . Rheum arthritis Paternal Grandmother     Social History Social History  Substance Use Topics  .  Smoking status: Never Smoker  . Smokeless tobacco: Never Used  . Alcohol use No     Allergies   Bee venom; Diclofenac; Toradol [ketorolac tromethamine]; Metoclopramide; and Tramadol   Review of Systems Review of Systems  Constitutional: Negative for appetite change, diaphoresis, fatigue, fever and unexpected weight change.  HENT: Negative for mouth sores.   Eyes: Negative for visual disturbance.  Respiratory: Negative for cough, chest tightness, shortness of breath and wheezing.   Cardiovascular: Negative for chest pain.  Gastrointestinal: Negative for abdominal pain, constipation, diarrhea, nausea and vomiting.  Endocrine: Negative for polydipsia, polyphagia and polyuria.  Genitourinary: Negative for dysuria, frequency, hematuria and urgency.  Musculoskeletal:  Positive for back pain and neck pain. Negative for neck stiffness.  Skin: Negative for rash.  Allergic/Immunologic: Negative for immunocompromised state.  Neurological: Negative for syncope, light-headedness and headaches.  Hematological: Does not bruise/bleed easily.  Psychiatric/Behavioral: Negative for sleep disturbance. The patient is not nervous/anxious.   All other systems reviewed and are negative.    Physical Exam Updated Vital Signs BP 127/86 (BP Location: Left Arm)   Pulse 72   Temp 98.6 F (37 C) (Oral)   Resp 18   SpO2 100%   Physical Exam  Constitutional: She appears well-developed and well-nourished. No distress.  HENT:  Head: Normocephalic and atraumatic.  Mouth/Throat: Oropharynx is clear and moist. No oropharyngeal exudate.  Eyes: Conjunctivae are normal.  Neck: Normal range of motion. Neck supple.  Full ROM with mild pain  Cardiovascular: Normal rate, regular rhythm and intact distal pulses.   Pulmonary/Chest: Effort normal and breath sounds normal. No respiratory distress. She has no wheezes.  Abdominal: Soft. She exhibits no distension. There is no tenderness.  Musculoskeletal:       Cervical back: She exhibits pain.       Thoracic back: She exhibits pain.       Lumbar back: She exhibits pain.  Full flexion of the T-spine and L-spine with pain No midline tenderness to the  T-spine or L-spine Tenderness to palpation of the bilateral paraspinous muscles of the L-spine  Lymphadenopathy:    She has no cervical adenopathy.  Neurological: She is alert.  Speech is clear and goal oriented, follows commands Normal 5/5 strength in upper and lower extremities bilaterally including dorsiflexion and plantar flexion, strong and equal grip strength Sensation normal to light and sharp touch Moves extremities without ataxia, coordination intact Normal gait Normal balance No Clonus  Skin: Skin is warm and dry. No rash noted. She is not diaphoretic. No erythema.    Psychiatric: She has a normal mood and affect. Her behavior is normal.  Nursing note and vitals reviewed.    ED Treatments / Results    Procedures Procedures (including critical care time)  Medications Ordered in ED Medications  oxyCODONE-acetaminophen (PERCOCET/ROXICET) 5-325 MG per tablet 2 tablet (2 tablets Oral Given 07/01/17 0303)  methocarbamol (ROBAXIN) tablet 500 mg (500 mg Oral Given 07/01/17 0303)  dexamethasone (DECADRON) injection 10 mg (10 mg Intramuscular Given 07/01/17 0451)  HYDROmorphone (DILAUDID) injection 1 mg (1 mg Intramuscular Given 07/01/17 0451)     Initial Impression / Assessment and Plan / ED Course  I have reviewed the triage vital signs and the nursing notes.  Pertinent labs & imaging results that were available during my care of the patient were reviewed by me and considered in my medical decision making (see chart for details).     Patient with back and radicular pain pain.  No neurological deficits and normal  neuro exam.  Patient can walk but states is painful.  No loss of bowel or bladder control.  No concern for cauda equina.  No fever, night sweats, weight loss, h/o cancer, IVDU.  RICE protocol, steroids and pain medicine indicated and discussed with patient. Patient reports she does not want to return to orthopedics for further management. Will refer to neurosurgery outpatient for management of her radiculopathy.  Discussed reasons to return immediately to the emergency department. Patient states understanding and is in agreement with the plan.   Final Clinical Impressions(s) / ED Diagnoses   Final diagnoses:  Chronic midline low back pain with sciatica, sciatica laterality unspecified  Radicular pain    New Prescriptions New Prescriptions   HYDROCODONE-ACETAMINOPHEN (NORCO/VICODIN) 5-325 MG TABLET    Take 1-2 tablets by mouth every 6 (six) hours as needed.   METHOCARBAMOL (ROBAXIN) 500 MG TABLET    Take 1 tablet (500 mg total) by mouth 2  (two) times daily.   PREDNISONE (DELTASONE) 20 MG TABLET    3 tabs po daily x 3 days, then 2 tabs x 3 days, then 1.5 tabs x 3 days, then 1 tab x 3 days, then 0.5 tabs x 3 days     Agapito Games 07/01/17 [place], Delice Bison, DO 07/01/17 0559

## 2017-07-01 NOTE — ED Notes (Addendum)
Pt complains of lower back pain that is a chronic condition for her.Pt reports a history of degenerative disk disease and a pinched nerve.  Pt states she has a procedure done at [facility] in December and her back has not been right since. Pt states she has been having pain from the tips of her fingers radiating to her lower back. Pt states she went numb yesterday and decided she needed to come to the hospital.

## 2017-09-04 ENCOUNTER — Emergency Department (HOSPITAL_COMMUNITY): Payer: BC Managed Care – PPO

## 2017-09-04 ENCOUNTER — Encounter (HOSPITAL_COMMUNITY): Payer: Self-pay

## 2017-09-04 ENCOUNTER — Emergency Department (HOSPITAL_COMMUNITY)
Admission: EM | Admit: 2017-09-04 | Discharge: 2017-09-04 | Disposition: A | Discharge status: Home / Self Care | Payer: BC Managed Care – PPO | Attending: Emergency Medicine | Admitting: Emergency Medicine

## 2017-09-04 DIAGNOSIS — M542 Cervicalgia: Secondary | ICD-10-CM

## 2017-09-04 DIAGNOSIS — Y999 Unspecified external cause status: Secondary | ICD-10-CM | POA: Insufficient documentation

## 2017-09-04 DIAGNOSIS — Y9389 Activity, other specified: Secondary | ICD-10-CM | POA: Insufficient documentation

## 2017-09-04 DIAGNOSIS — J45909 Unspecified asthma, uncomplicated: Secondary | ICD-10-CM | POA: Diagnosis not present

## 2017-09-04 DIAGNOSIS — M5442 Lumbago with sciatica, left side: Secondary | ICD-10-CM | POA: Diagnosis not present

## 2017-09-04 DIAGNOSIS — Y92410 Unspecified street and highway as the place of occurrence of the external cause: Secondary | ICD-10-CM | POA: Insufficient documentation

## 2017-09-04 MED ORDER — ONDANSETRON HCL 4 MG/2ML IJ SOLN
4.0000 mg | Freq: Once | INTRAMUSCULAR | Status: AC
  Administered 2017-09-04: 4 mg via INTRAVENOUS

## 2017-09-04 MED ORDER — METHOCARBAMOL 500 MG PO TABS
750.0000 mg | ORAL_TABLET | Freq: Once | ORAL | Status: AC
  Administered 2017-09-04: 750 mg via ORAL
  Filled 2017-09-04: qty 2

## 2017-09-04 MED ORDER — METHOCARBAMOL 500 MG PO TABS: 500.0000 mg | ORAL_TABLET | Freq: Three times a day (TID) | ORAL | 0 refills | Status: DC | PRN

## 2017-09-04 MED ORDER — ONDANSETRON HCL 4 MG/2ML IJ SOLN
4.0000 mg | Freq: Once | INTRAMUSCULAR | Status: DC
  Filled 2017-09-04: qty 2

## 2017-09-04 MED ORDER — DEXAMETHASONE SODIUM PHOSPHATE 10 MG/ML IJ SOLN
10.0000 mg | Freq: Once | INTRAMUSCULAR | Status: AC
  Administered 2017-09-04: 10 mg via INTRAVENOUS

## 2017-09-04 MED ORDER — HYDROMORPHONE HCL 1 MG/ML IJ SOLN
1.0000 mg | Freq: Once | INTRAMUSCULAR | Status: AC
  Administered 2017-09-04: 1 mg via INTRAVENOUS

## 2017-09-04 MED ORDER — HYDROMORPHONE HCL 1 MG/ML IJ SOLN
1.0000 mg | Freq: Once | INTRAMUSCULAR | Status: DC
  Filled 2017-09-04: qty 1

## 2017-09-04 MED ORDER — ONDANSETRON 4 MG PO TBDP: 4.0000 mg | ORAL_TABLET | Freq: Once | ORAL | Status: DC

## 2017-09-04 MED ORDER — HYDROMORPHONE HCL 1 MG/ML IJ SOLN: 1.0000 mg | Freq: Once | INTRAMUSCULAR | Status: DC

## 2017-09-04 MED ORDER — DEXAMETHASONE SODIUM PHOSPHATE 10 MG/ML IJ SOLN
10.0000 mg | Freq: Once | INTRAMUSCULAR | Status: DC
  Filled 2017-09-04: qty 1

## 2017-09-04 MED ORDER — DEXAMETHASONE SODIUM PHOSPHATE 10 MG/ML IJ SOLN: 10.0000 mg | Freq: Once | INTRAMUSCULAR | Status: DC
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
## 2017-09-04 NOTE — ED Triage Notes (Signed)
Called for triage no response 
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
## 2017-09-04 NOTE — Discharge Instructions (Signed)

## 2017-09-21 ENCOUNTER — Emergency Department (HOSPITAL_COMMUNITY)
Admission: EM | Admit: 2017-09-21 | Discharge: 2017-09-22 | Disposition: A | Discharge status: Home / Self Care | Payer: BC Managed Care – PPO | Attending: Emergency Medicine | Admitting: Emergency Medicine

## 2017-09-21 ENCOUNTER — Encounter (HOSPITAL_COMMUNITY): Payer: Self-pay | Admitting: Emergency Medicine

## 2017-09-21 ENCOUNTER — Emergency Department (HOSPITAL_COMMUNITY): Payer: BC Managed Care – PPO

## 2017-09-21 DIAGNOSIS — M25561 Pain in right knee: Secondary | ICD-10-CM | POA: Diagnosis not present

## 2017-09-21 DIAGNOSIS — M542 Cervicalgia: Secondary | ICD-10-CM | POA: Diagnosis present

## 2017-09-21 DIAGNOSIS — M25562 Pain in left knee: Secondary | ICD-10-CM | POA: Insufficient documentation

## 2017-09-21 DIAGNOSIS — J45909 Unspecified asthma, uncomplicated: Secondary | ICD-10-CM | POA: Insufficient documentation

## 2017-09-21 DIAGNOSIS — M545 Low back pain: Secondary | ICD-10-CM | POA: Insufficient documentation
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
## 2017-09-22 ENCOUNTER — Emergency Department (HOSPITAL_COMMUNITY): Payer: BC Managed Care – PPO

## 2017-09-22 MED ORDER — LIDOCAINE 5 % EX PTCH: 1.0000 | MEDICATED_PATCH | CUTANEOUS | 0 refills | Status: DC

## 2017-09-22 MED ORDER — METHOCARBAMOL 500 MG PO TABS: 500.0000 mg | ORAL_TABLET | Freq: Two times a day (BID) | ORAL | 0 refills | Status: DC

## 2017-09-22 MED ORDER — NAPROXEN 250 MG PO TABS
500.0000 mg | ORAL_TABLET | Freq: Once | ORAL | Status: AC
  Administered 2017-09-22: 500 mg via ORAL
  Filled 2017-09-22: qty 2

## 2017-09-22 MED ORDER — HYDROCODONE-ACETAMINOPHEN 5-325 MG PO TABS
2.0000 | ORAL_TABLET | Freq: Once | ORAL | Status: AC
  Administered 2017-09-22: 2 via ORAL
  Filled 2017-09-22: qty 2

## 2017-09-22 NOTE — Discharge Instructions (Signed)
Expect your soreness to increase over the next 2-3 days. Take it easy, but do not lay around too much as this may make any stiffness worse.  °Antiinflammatory medications: Take 600 mg of ibuprofen every 6 hours or 440 mg (over the counter dose) to 500 mg (prescription dose) of naproxen every 12 hours for the next 3 days. After this time, these medications may be used as needed for pain. Take these medications with food to avoid upset stomach. Choose only one of these medications, do not take them together.  °Tylenol: Should you continue to have additional pain while taking the ibuprofen or naproxen, you may add in tylenol as needed. Your daily total maximum amount of tylenol from all sources should be limited to 4000mg/day for persons without liver problems, or 2000mg/day for those with liver problems. °Muscle relaxer: Robaxin is a muscle relaxer and may help loosen stiff muscles. Do not take the Robaxin while driving or performing other dangerous activities.  °Lidocaine patches: These are available via either prescription or over-the-counter. The over-the-counter option may be more economical one and are likely just as effective. There are multiple over-the-counter brands, such as Salonpas. °Exercises: Be sure to perform the attached exercises starting with three times a week and working up to performing them daily. This is an essential part of preventing long term problems.  ° °Follow up with a primary care provider for any future management of these complaints. °

## 2017-09-22 NOTE — ED Notes (Signed)
Pt in XR. 
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
## 2018-08-16 ENCOUNTER — Other Ambulatory Visit: Payer: Self-pay

## 2018-08-16 ENCOUNTER — Emergency Department (HOSPITAL_BASED_OUTPATIENT_CLINIC_OR_DEPARTMENT_OTHER)
Admission: EM | Admit: 2018-08-16 | Discharge: 2018-08-17 | Disposition: A | Discharge status: Home / Self Care | Payer: BC Managed Care – PPO | Attending: Emergency Medicine | Admitting: Emergency Medicine

## 2018-08-16 ENCOUNTER — Emergency Department (HOSPITAL_BASED_OUTPATIENT_CLINIC_OR_DEPARTMENT_OTHER): Payer: BC Managed Care – PPO

## 2018-08-16 ENCOUNTER — Encounter (HOSPITAL_BASED_OUTPATIENT_CLINIC_OR_DEPARTMENT_OTHER): Payer: Self-pay | Admitting: Emergency Medicine

## 2018-08-16 DIAGNOSIS — Z79899 Other long term (current) drug therapy: Secondary | ICD-10-CM | POA: Diagnosis not present

## 2018-08-16 DIAGNOSIS — M25561 Pain in right knee: Secondary | ICD-10-CM | POA: Diagnosis present

## 2018-08-16 DIAGNOSIS — R072 Precordial pain: Secondary | ICD-10-CM | POA: Diagnosis not present

## 2018-08-16 DIAGNOSIS — Y999 Unspecified external cause status: Secondary | ICD-10-CM | POA: Diagnosis not present

## 2018-08-16 DIAGNOSIS — Y9301 Activity, walking, marching and hiking: Secondary | ICD-10-CM | POA: Diagnosis not present

## 2018-08-16 DIAGNOSIS — W0110XA Fall on same level from slipping, tripping and stumbling with subsequent striking against unspecified object, initial encounter: Secondary | ICD-10-CM | POA: Diagnosis not present

## 2018-08-16 DIAGNOSIS — Y929 Unspecified place or not applicable: Secondary | ICD-10-CM | POA: Diagnosis not present

## 2018-08-16 DIAGNOSIS — G8929 Other chronic pain: Secondary | ICD-10-CM | POA: Diagnosis not present

## 2018-08-16 DIAGNOSIS — J45909 Unspecified asthma, uncomplicated: Secondary | ICD-10-CM | POA: Insufficient documentation

## 2018-08-16 DIAGNOSIS — W19XXXA Unspecified fall, initial encounter: Secondary | ICD-10-CM

## 2018-08-16 LAB — URINALYSIS, ROUTINE W REFLEX MICROSCOPIC
Glucose, UA: NEGATIVE mg/dL
HGB URINE DIPSTICK: NEGATIVE
KETONES UR: 15 mg/dL — AB
Leukocytes, UA: NEGATIVE
Nitrite: NEGATIVE
Protein, ur: NEGATIVE mg/dL
Specific Gravity, Urine: 1.025 (ref 1.005–1.030)
pH: 6.5 (ref 5.0–8.0)

## 2018-08-16 LAB — CBC
HCT: 35.9 % — ABNORMAL LOW (ref 36.0–46.0)
Hemoglobin: 11.7 g/dL — ABNORMAL LOW (ref 12.0–15.0)
MCH: 29.8 pg (ref 26.0–34.0)
MCHC: 32.6 g/dL (ref 30.0–36.0)
MCV: 91.3 fL (ref 78.0–100.0)
PLATELETS: 268 10*3/uL (ref 150–400)
RBC: 3.93 MIL/uL (ref 3.87–5.11)
RDW: 13.5 % (ref 11.5–15.5)
WBC: 5.7 10*3/uL (ref 4.0–10.5)

## 2018-08-16 MED ORDER — ONDANSETRON 4 MG PO TBDP
4.0000 mg | ORAL_TABLET | Freq: Once | ORAL | Status: AC
  Administered 2018-08-16: 4 mg via ORAL
  Filled 2018-08-16: qty 1

## 2018-08-16 MED ORDER — HYDROCODONE-ACETAMINOPHEN 5-325 MG PO TABS
1.0000 | ORAL_TABLET | Freq: Once | ORAL | Status: AC
  Administered 2018-08-16: 1 via ORAL
  Filled 2018-08-16: qty 1

## 2018-08-16 NOTE — ED Notes (Signed)
Patient transported to X-ray 

## 2018-08-16 NOTE — ED Notes (Signed)
Pt called out requesting EDP. Informed the wait was a little longer.

## 2018-08-16 NOTE — ED Notes (Signed)
EDP at bedside  

## 2018-08-16 NOTE — ED Triage Notes (Signed)
Pt reports she slipped on markers that her special needs client had thrown in the floor today; feels like she twisted RT knee, which was already painful from a previous injury; Pt also reports RT upper rib and back pain x 2-3 days (not r/t to fall today)

## 2018-08-16 NOTE — ED Notes (Signed)
ED Provider at bedside. 

## 2018-08-17 LAB — COMPREHENSIVE METABOLIC PANEL
ALT: 13 U/L (ref 0–44)
AST: 14 U/L — AB (ref 15–41)
Albumin: 3.6 g/dL (ref 3.5–5.0)
Alkaline Phosphatase: 74 U/L (ref 38–126)
Anion gap: 8 (ref 5–15)
BUN: 12 mg/dL (ref 6–20)
CO2: 27 mmol/L (ref 22–32)
CREATININE: 0.45 mg/dL (ref 0.44–1.00)
Calcium: 8.8 mg/dL — ABNORMAL LOW (ref 8.9–10.3)
Chloride: 106 mmol/L (ref 98–111)
GFR calc Af Amer: >60 mL/min (ref >60)
GLUCOSE: 100 mg/dL — AB (ref 70–99)
Potassium: 4 mmol/L (ref 3.5–5.1)
Sodium: 141 mmol/L (ref 135–145)
Total Bilirubin: 0.6 mg/dL (ref 0.3–1.2)
Total Protein: 7.2 g/dL (ref 6.5–8.1)

## 2018-08-17 LAB — LIPASE, BLOOD: LIPASE: 24 U/L (ref 11–51)

## 2018-08-17 LAB — TROPONIN I

## 2018-08-17 MED ORDER — HYDROCODONE-ACETAMINOPHEN 5-325 MG PO TABS: 1.0000 | ORAL_TABLET | Freq: Four times a day (QID) | ORAL | 0 refills | Status: DC | PRN

## 2018-08-17 NOTE — ED Provider Notes (Addendum)
[facility] EMERGENCY DEPARTMENT Provider Note   CSN: [id number] Arrival date & time: 08/16/18  1914     History   Chief Complaint Chief Complaint  Patient presents with  . Chest Pain  . Knee Pain    HPI [name] is a 42 y.o. female.  Patient presenting with 2 separate concerns.  The first concern we will talk about is a recurrent injury to her right knee.  Patient states she slipped at home in the care of a special needs client that was acting out.  Patient feels like she twisted her right knee.  Patient has a known previous injury to the knee and was told by [facility] orthopedics there was a good chance her was a meniscal injury.  They are treating that with a knee Velcro wrap immobilizer and are planning to reevaluate her in the next month.  To see how much general healing she gets before they do further evaluation.  Patient reinjured that knee with today's events.  Prior to this event patient was having difficulty with back pain kind of in the right posterior thoracic area for 2 days.  And then this morning started having pain on the right side of her chest also right anterior lower lung area.  That pain started at 9 this morning and has been constant.  Neither 1 of these pains is made worse or better by any kind of movement.  Patient denies any abdominal pain.  No GI symptoms.  No fevers.  No shortness of breath.  In addition with the fall patient did not have any loss of consciousness did not hit her head.  Did not hurt her neck.     Past Medical History:  Diagnosis Date  . Asthma    diagnosed as  a young adult   . IBS (irritable bowel syndrome)   . Rheumatoid arthritis(714.0)     Patient Active Problem List   Diagnosis Date Noted  . Systemic viral illness 11/13/2012  . Morbid obesity with BMI of 40.0-44.9, adult ([place]) 11/13/2012  . Tachycardia 11/13/2012  . Sinusitis, acute frontal 11/13/2012  . Rheumatoid arthritis(714.0)   . Asthma     Past  Surgical History:  Procedure Laterality Date  . ABDOMINAL HYSTERECTOMY    . APPENDECTOMY    . BREAST REDUCTION SURGERY    . CHOLECYSTECTOMY    . CYSTECTOMY    . HERNIA REPAIR    . TUBAL LIGATION       OB History   None      Home Medications    Prior to Admission medications   Medication Sig Start Date End Date Taking? Authorizing Provider  Acetaminophen (TYLENOL PO) Take 2-3 tablets by mouth every 6 (six) hours as needed (pain/headache).    [provider]  albuterol (PROVENTIL HFA;VENTOLIN HFA) 108 (90 BASE) MCG/ACT inhaler Inhale 2 puffs into the lungs every 6 (six) hours as needed for wheezing or shortness of breath.     [provider]  HYDROcodone-acetaminophen (NORCO/VICODIN) 5-325 MG tablet Take 1-2 tablets by mouth every 6 (six) hours as needed. Patient not taking: Reported on 09/04/2017 07/01/17   Muthersbaugh, Jarrett Soho, PA-C  lidocaine (LIDODERM) 5 % Place 1 patch onto the skin daily. Remove & Discard patch within 12 hours or as directed by MD 09/22/17   Joy, Shawn C, PA-C  methocarbamol (ROBAXIN) 500 MG tablet Take 1 tablet (500 mg total) by mouth every 8 (eight) hours as needed for muscle spasms. 09/04/17  Fawze, Mina A, PA-C  methocarbamol (ROBAXIN) 500 MG tablet Take 1 tablet (500 mg total) by mouth 2 (two) times daily. 09/22/17   Joy, Shawn C, PA-C  Multiple Vitamins-Minerals (EMERGEN-C VITAMIN C PO) Take 1,000 mg by mouth daily.    [provider]    Family History Family History  Problem Relation Age of Onset  . Rheum arthritis Father   . Breast cancer Father        died  . Lupus Maternal Aunt   . Rheum arthritis Mother   . Rheum arthritis Maternal Grandmother   . Rheum arthritis Maternal Grandfather   . Rheum arthritis Paternal Grandfather   . Rheum arthritis Paternal Grandmother     Social History Social History   Tobacco Use  . Smoking status: Never Smoker  . Smokeless tobacco: Never Used  Substance Use Topics  . Alcohol  use: No  . Drug use: No     Allergies   Bee venom; Doxycycline; Diclofenac; Toradol [ketorolac tromethamine]; Metoclopramide; and Tramadol   Review of Systems Review of Systems  Constitutional: Negative for fever.  HENT: Negative for congestion.   Eyes: Negative for visual disturbance.  Respiratory: Negative for shortness of breath.   Cardiovascular: Positive for chest pain.  Gastrointestinal: Negative for abdominal pain, nausea and vomiting.  Genitourinary: Negative for dysuria.  Musculoskeletal: Positive for back pain and joint swelling.  Skin: Negative for rash.  Neurological: Negative for syncope.  Hematological: Does not bruise/bleed easily.  Psychiatric/Behavioral: Negative for confusion.     Physical Exam Updated Vital Signs BP 120/70 (BP Location: Right Arm)   Pulse 72   Temp 98.3 F (36.8 C) (Oral)   Resp 16   Ht 1.651 m (5\' 5" )   Wt 127.5 kg   SpO2 100%   BMI 46.76 kg/m   Physical Exam  Constitutional: She is oriented to person, place, and time. She appears well-developed and well-nourished. No distress.  HENT:  Head: Normocephalic and atraumatic.  Eyes: Pupils are equal, round, and reactive to light. EOM are normal.  Neck: Neck supple.  Cardiovascular: Normal rate, regular rhythm and normal heart sounds.  Pulmonary/Chest: Effort normal and breath sounds normal. No respiratory distress. She exhibits no tenderness.  Abdominal: Soft. Bowel sounds are normal.  Musculoskeletal: She exhibits edema and tenderness.  Lower extremities are normal.  Right knee with a slight amount of swelling.  No patellar dislocation.  Also some tenderness along the joint line.  Limited range of motion.  Distally neurovascularly intact.  Left knee without any significant abnormalities.  No evidence of any abrasions.  Neurological: She is alert and oriented to person, place, and time. No cranial nerve deficit or sensory deficit. She exhibits normal muscle tone. Coordination normal.    Skin: Skin is warm. No rash noted.  Nursing note and vitals reviewed.    ED Treatments / Results  Labs (all labs ordered are listed, but only abnormal results are displayed) Labs Reviewed  URINALYSIS, ROUTINE W REFLEX MICROSCOPIC - Abnormal; Notable for the following components:      Result Value   Bilirubin Urine LARGE (*)    Ketones, ur 15 (*)    All other components within normal limits  COMPREHENSIVE METABOLIC PANEL - Abnormal; Notable for the following components:   Glucose, Bld 100 (*)    Calcium 8.8 (*)    AST 14 (*)    All other components within normal limits  CBC - Abnormal; Notable for the following components:   Hemoglobin 11.7 (*)  HCT 35.9 (*)    All other components within normal limits  TROPONIN I  LIPASE, BLOOD    EKG None   ED ECG REPORT   Date: 08/17/2018  Rate: 66  Rhythm: normal sinus rhythm  QRS Axis: normal  Intervals: normal  ST/T Wave abnormalities: normal  Conduction Disutrbances:nonspecific intraventricular conduction delay  Narrative Interpretation:   Old EKG Reviewed: none available  I have personally reviewed the EKG tracing and agree with the computerized printout as noted.   Radiology Dg Knee Complete 4 Views Right  Result Date: 08/16/2018 CLINICAL DATA:  Right knee pain after fall. EXAM: RIGHT KNEE - COMPLETE 4+ VIEW COMPARISON:  Right knee x-rays dated July 26, 2018. FINDINGS: No evidence of fracture, dislocation, or joint effusion. No evidence of arthropathy or other focal bone abnormality. Soft tissues are unremarkable. IMPRESSION: Negative. Electronically Signed   By: Titus Dubin M.D.   On: 08/16/2018 20:28    Procedures Procedures (including critical care time)  Medications Ordered in ED Medications  ondansetron (ZOFRAN-ODT) disintegrating tablet 4 mg (4 mg Oral Given 08/16/18 2328)  HYDROcodone-acetaminophen (NORCO/VICODIN) 5-325 MG per tablet 1 tablet (1 tablet Oral Given 08/16/18 2327)     Initial Impression /  Assessment and Plan / ED Course  I have reviewed the triage vital signs and the nursing notes.  Pertinent labs & imaging results that were available during my care of the patient were reviewed by me and considered in my medical decision making (see chart for details).    X-ray of the right knee without any bony abnormalities.  Patient could have exacerbated a pre-existing injury or could have new injury.  Patient will follow-up with [facility] orthopedics for this.  Recommend she give them a call and see if they want to see her sooner than later.  Patient will continue to use her knee immobilizer.  For the right anterior chest pain troponin negative EKG without any acute findings.  Labs without significant abnormalities.  Urine slightly concentrated but not specifically abnormal.  No evidence of urinary tract infection.  Patient without a leukocytosis.  Liver function test without significant abnormalities.  Lipase not elevated.  Urine bilirubin was elevated but total bili on liver function tests were normal.  Official read on chest x-ray still pending.  If negative patient stable for discharge home close follow-up with her primary care doctor and orthopedics at [facility].  Clinically think it is unlikely based on the location of the pain on the right anterior chest that it would be gallbladder in nature.  Also with LFTs being normal normal white count certainly no evidence of acute cholecystitis.  Chest x-ray without any significant abnormalities.   Final Clinical Impressions(s) / ED Diagnoses   Final diagnoses:  Fall, initial encounter  Chronic pain of right knee  Precordial pain    ED Discharge Orders    None       Fredia Sorrow, MD 08/17/18 6789    Fredia Sorrow, MD 08/17/18 [id number]

## 2018-08-17 NOTE — Discharge Instructions (Signed)
Return for any new or worse symptoms.  Follow-up with your regular doctor regarding the chest pain.  Today's work-up without any significant findings.  Follow-up with orthopedics for the reinjury to the right knee.  Continue your knee immobilizer.  Take the hydrocodone for pain.

## 2018-08-17 NOTE — ED Notes (Signed)
Patient transported to X-ray 

## 2018-09-20 ENCOUNTER — Other Ambulatory Visit: Payer: Self-pay

## 2018-09-20 ENCOUNTER — Encounter (HOSPITAL_BASED_OUTPATIENT_CLINIC_OR_DEPARTMENT_OTHER): Payer: Self-pay | Admitting: *Deleted

## 2018-09-20 ENCOUNTER — Emergency Department (HOSPITAL_BASED_OUTPATIENT_CLINIC_OR_DEPARTMENT_OTHER)
Admission: EM | Admit: 2018-09-20 | Discharge: 2018-09-20 | Disposition: A | Discharge status: Home / Self Care | Payer: BC Managed Care – PPO | Attending: Emergency Medicine | Admitting: Emergency Medicine

## 2018-09-20 DIAGNOSIS — Z79899 Other long term (current) drug therapy: Secondary | ICD-10-CM | POA: Diagnosis not present

## 2018-09-20 DIAGNOSIS — X509XXA Other and unspecified overexertion or strenuous movements or postures, initial encounter: Secondary | ICD-10-CM | POA: Insufficient documentation

## 2018-09-20 DIAGNOSIS — Y999 Unspecified external cause status: Secondary | ICD-10-CM | POA: Insufficient documentation

## 2018-09-20 DIAGNOSIS — Y9301 Activity, walking, marching and hiking: Secondary | ICD-10-CM | POA: Diagnosis not present

## 2018-09-20 DIAGNOSIS — K0889 Other specified disorders of teeth and supporting structures: Secondary | ICD-10-CM

## 2018-09-20 DIAGNOSIS — Y929 Unspecified place or not applicable: Secondary | ICD-10-CM | POA: Diagnosis not present

## 2018-09-20 DIAGNOSIS — S79921A Unspecified injury of right thigh, initial encounter: Secondary | ICD-10-CM | POA: Diagnosis present

## 2018-09-20 DIAGNOSIS — J45909 Unspecified asthma, uncomplicated: Secondary | ICD-10-CM | POA: Diagnosis not present

## 2018-09-20 DIAGNOSIS — M62838 Other muscle spasm: Secondary | ICD-10-CM | POA: Diagnosis not present

## 2018-09-20 DIAGNOSIS — R1084 Generalized abdominal pain: Secondary | ICD-10-CM

## 2018-09-20 DIAGNOSIS — S76311A Strain of muscle, fascia and tendon of the posterior muscle group at thigh level, right thigh, initial encounter: Secondary | ICD-10-CM | POA: Insufficient documentation

## 2018-09-20 LAB — CBC WITH DIFFERENTIAL/PLATELET
Abs Immature Granulocytes: 0.02 10*3/uL (ref 0.00–0.07)
Basophils Absolute: 0 10*3/uL (ref 0.0–0.1)
Basophils Relative: 0 %
EOS ABS: 0.1 10*3/uL (ref 0.0–0.5)
Eosinophils Relative: 1 %
HCT: 39.4 % (ref 36.0–46.0)
Hemoglobin: 12.5 g/dL (ref 12.0–15.0)
Immature Granulocytes: 0 %
Lymphocytes Relative: 17 %
Lymphs Abs: 1.4 10*3/uL (ref 0.7–4.0)
MCH: 29.6 pg (ref 26.0–34.0)
MCHC: 31.7 g/dL (ref 30.0–36.0)
MCV: 93.1 fL (ref 80.0–100.0)
Monocytes Absolute: 0.5 10*3/uL (ref 0.1–1.0)
Monocytes Relative: 6 %
Neutro Abs: 6.3 10*3/uL (ref 1.7–7.7)
Neutrophils Relative %: 76 %
Platelets: 262 10*3/uL (ref 150–400)
RBC: 4.23 MIL/uL (ref 3.87–5.11)
RDW: 13.2 % (ref 11.5–15.5)
WBC: 8.3 10*3/uL (ref 4.0–10.5)
nRBC: 0 % (ref 0.0–0.2)

## 2018-09-20 LAB — COMPREHENSIVE METABOLIC PANEL
ALK PHOS: 87 U/L (ref 38–126)
ALT: 11 U/L (ref 0–44)
ANION GAP: 8 (ref 5–15)
AST: 13 U/L — AB (ref 15–41)
Albumin: 3.6 g/dL (ref 3.5–5.0)
BILIRUBIN TOTAL: 0.5 mg/dL (ref 0.3–1.2)
BUN: 12 mg/dL (ref 6–20)
CALCIUM: 9.3 mg/dL (ref 8.9–10.3)
CO2: 27 mmol/L (ref 22–32)
Chloride: 101 mmol/L (ref 98–111)
Creatinine, Ser: 0.71 mg/dL (ref 0.44–1.00)
GFR calc Af Amer: >60 mL/min (ref >60)
Glucose, Bld: 98 mg/dL (ref 70–99)
Potassium: 4.2 mmol/L (ref 3.5–5.1)
Sodium: 136 mmol/L (ref 135–145)
Total Protein: 7.8 g/dL (ref 6.5–8.1)

## 2018-09-20 LAB — URINALYSIS, ROUTINE W REFLEX MICROSCOPIC
GLUCOSE, UA: NEGATIVE mg/dL
HGB URINE DIPSTICK: NEGATIVE
Ketones, ur: NEGATIVE mg/dL
Leukocytes, UA: NEGATIVE
Nitrite: NEGATIVE
Protein, ur: NEGATIVE mg/dL
Specific Gravity, Urine: >1.03 — ABNORMAL HIGH (ref 1.005–1.030)
pH: 6 (ref 5.0–8.0)

## 2018-09-20 LAB — PREGNANCY, URINE: Preg Test, Ur: NEGATIVE

## 2018-09-20 LAB — LIPASE, BLOOD: LIPASE: 24 U/L (ref 11–51)

## 2018-09-20 MED ORDER — PROCHLORPERAZINE EDISYLATE 10 MG/2ML IJ SOLN
10.0000 mg | Freq: Once | INTRAMUSCULAR | Status: AC
  Administered 2018-09-20: 10 mg via INTRAVENOUS
  Filled 2018-09-20: qty 2

## 2018-09-20 MED ORDER — SODIUM CHLORIDE 0.9 % IV BOLUS
1000.0000 mL | Freq: Once | INTRAVENOUS | Status: AC
  Administered 2018-09-20: 1000 mL via INTRAVENOUS

## 2018-09-20 MED ORDER — CLINDAMYCIN HCL 150 MG PO CAPS: 450.0000 mg | ORAL_CAPSULE | Freq: Three times a day (TID) | ORAL | 0 refills | Status: AC

## 2018-09-20 MED ORDER — ONDANSETRON 4 MG PO TBDP: ORAL_TABLET | ORAL | 0 refills | Status: DC

## 2018-09-20 MED ORDER — MORPHINE SULFATE (PF) 2 MG/ML IV SOLN
2.0000 mg | Freq: Once | INTRAVENOUS | Status: AC
  Administered 2018-09-20: 2 mg via INTRAVENOUS
  Filled 2018-09-20: qty 1

## 2018-09-20 MED ORDER — DIPHENHYDRAMINE HCL 50 MG/ML IJ SOLN
25.0000 mg | Freq: Once | INTRAMUSCULAR | Status: AC
  Administered 2018-09-20: 25 mg via INTRAVENOUS
  Filled 2018-09-20: qty 1

## 2018-09-20 NOTE — Discharge Instructions (Signed)
Take 4 over the counter ibuprofen tablets 3 times a day or 2 over-the-counter naproxen tablets twice a day for pain. Also take tylenol 1000mg (2 extra strength) four times a day.   Return for worsening pain, fever, inability to eat or drink

## 2018-09-20 NOTE — ED Triage Notes (Signed)
Pt has a broken tooth on both sides of her mouth, since then she has had a very bad pain throughout her body. Now she is having a "shooting pain through her brain" the pain is crippling. Pain in her knee as well.

## 2018-09-20 NOTE — ED Provider Notes (Signed)
[facility] EMERGENCY DEPARTMENT Provider Note   CSN: [id number] Arrival date & time: 09/20/18  1729     History   Chief Complaint Chief Complaint  Patient presents with  . Headache  . Dental Pain    HPI [name] is a 42 y.o. female.  42 yo F with multiple complaints.  This is been going on for differing timelines.  The patient is been having right knee pain is seen orthopedics for him for torn meniscus and a possible ligamentous injury.  Today she was walking and had a sudden searing pain to the right side of her head that radiated up to the front part of her head.  This pain was severe and brought her to her knees.  It resolved quickly and she went back to work.  She denied any unilateral numbness or weakness denies difficulty with speech or swallowing.  During the episode where she dropped down she did feel like she reinjured her right knee.  Pain along the medial aspect worse with movement palpation or twisting.  She has been able to ambulate on it with though with some tenderness.  She also has been complaining of bilateral upper molar fracture.  The patient broke off the right upper tooth about 2 weeks ago in the left upper tooth about a week ago.  Since then she is feeling sick to her stomach as well as having diffuse abdominal pain.  She feels that her left upper cheek is much more swollen than normal.  Feels that it radiates into her ear.  The history is provided by the patient.  Illness  This is a new problem. The current episode started yesterday. The problem occurs constantly. The problem has not changed since onset.Associated symptoms include abdominal pain and headaches. Pertinent negatives include no chest pain and no shortness of breath. Nothing aggravates the symptoms. Nothing relieves the symptoms. She has tried nothing for the symptoms. The treatment provided no relief.    Past Medical History:  Diagnosis Date  . Asthma    diagnosed as  a young  adult   . IBS (irritable bowel syndrome)   . Rheumatoid arthritis(714.0)     Patient Active Problem List   Diagnosis Date Noted  . Systemic viral illness 11/13/2012  . Morbid obesity with BMI of 40.0-44.9, adult ([place]) 11/13/2012  . Tachycardia 11/13/2012  . Sinusitis, acute frontal 11/13/2012  . Rheumatoid arthritis(714.0)   . Asthma     Past Surgical History:  Procedure Laterality Date  . ABDOMINAL HYSTERECTOMY    . APPENDECTOMY    . BREAST REDUCTION SURGERY    . CHOLECYSTECTOMY    . CYSTECTOMY    . HERNIA REPAIR    . TUBAL LIGATION       OB History   None      Home Medications    Prior to Admission medications   Medication Sig Start Date End Date Taking? Authorizing Provider  Acetaminophen (TYLENOL PO) Take 2-3 tablets by mouth every 6 (six) hours as needed (pain/headache).   Yes [provider]  albuterol (PROVENTIL HFA;VENTOLIN HFA) 108 (90 BASE) MCG/ACT inhaler Inhale 2 puffs into the lungs every 6 (six) hours as needed for wheezing or shortness of breath.     [provider]  clindamycin (CLEOCIN) 150 MG capsule Take 3 capsules (450 mg total) by mouth 3 (three) times daily for 10 days. 09/20/18 09/30/18  Deno Etienne, DO  HYDROcodone-acetaminophen (NORCO/VICODIN) 5-325 MG tablet Take 1-2 tablets by mouth every  6 (six) hours as needed. Patient not taking: Reported on 09/04/2017 07/01/17   Muthersbaugh, Jarrett Soho, PA-C  HYDROcodone-acetaminophen (NORCO/VICODIN) 5-325 MG tablet Take 1 tablet by mouth every 6 (six) hours as needed for moderate pain. 08/17/18   Fredia Sorrow, MD  lidocaine (LIDODERM) 5 % Place 1 patch onto the skin daily. Remove & Discard patch within 12 hours or as directed by MD 09/22/17   Joy, Shawn C, PA-C  methocarbamol (ROBAXIN) 500 MG tablet Take 1 tablet (500 mg total) by mouth every 8 (eight) hours as needed for muscle spasms. 09/04/17   Nils Flack, Mina A, PA-C  methocarbamol (ROBAXIN) 500 MG tablet Take 1 tablet (500 mg total) by mouth 2  (two) times daily. 09/22/17   Joy, Shawn C, PA-C  Multiple Vitamins-Minerals (EMERGEN-C VITAMIN C PO) Take 1,000 mg by mouth daily.    [provider]  ondansetron (ZOFRAN ODT) 4 MG disintegrating tablet 4mg  ODT q4 hours prn nausea/vomit 09/20/18   Deno Etienne, DO    Family History Family History  Problem Relation Age of Onset  . Rheum arthritis Father   . Breast cancer Father        died  . Lupus Maternal Aunt   . Rheum arthritis Mother   . Rheum arthritis Maternal Grandmother   . Rheum arthritis Maternal Grandfather   . Rheum arthritis Paternal Grandfather   . Rheum arthritis Paternal Grandmother     Social History Social History   Tobacco Use  . Smoking status: Never Smoker  . Smokeless tobacco: Never Used  Substance Use Topics  . Alcohol use: No  . Drug use: No     Allergies   Bee venom; Doxycycline; Diclofenac; Toradol [ketorolac tromethamine]; Metoclopramide; and Tramadol   Review of Systems Review of Systems  Constitutional: Negative for chills and fever.  HENT: Positive for dental problem and ear pain. Negative for congestion and rhinorrhea.   Eyes: Negative for redness and visual disturbance.  Respiratory: Negative for shortness of breath and wheezing.   Cardiovascular: Negative for chest pain and palpitations.  Gastrointestinal: Positive for abdominal pain and nausea. Negative for vomiting.  Genitourinary: Negative for dysuria and urgency.  Musculoskeletal: Positive for arthralgias and gait problem. Negative for myalgias.  Skin: Negative for pallor and wound.  Neurological: Positive for headaches. Negative for dizziness.     Physical Exam Updated Vital Signs BP 122/73   Pulse 92   Temp 98 F (36.7 C)   Resp 18   Ht 5\' 5"  (1.651 m)   Wt 126.1 kg   SpO2 99%   BMI 46.26 kg/m   Physical Exam  Constitutional: She is oriented to person, place, and time. She appears well-developed and well-nourished. No distress.  HENT:  Head: Normocephalic  and atraumatic.  3 finger trismus, mild swelling to the left cheek area.  No palpable fluctuance along the gumline.  She has tenderness focally to the left upper fractured area of her second molar.  No surrounding erythema or induration.  Tolerating secretions without difficulty.  No sublingual swelling.  Right first molar fracture mildly tender at the gumline.  No fluctuance or edema.  Right TM with serous effusion.  Left TM is normal.  Patient has pain and tenderness about the right trapezius.  Focally tender at the attachment of the trapezius to the occiput.  Eyes: Pupils are equal, round, and reactive to light. EOM are normal.  Neck: Normal range of motion. Neck supple.  Cardiovascular: Normal rate and regular rhythm. Exam reveals no gallop and  no friction rub.  No murmur heard. Pulmonary/Chest: Effort normal. She has no wheezes. She has no rales.  Abdominal: Soft. She exhibits no distension. There is no tenderness.  Musculoskeletal: She exhibits no edema or tenderness.       Legs: Neurological: She is alert and oriented to person, place, and time. She has normal strength. No cranial nerve deficit or sensory deficit. She displays a negative Romberg sign. Coordination normal. GCS eye subscore is 4. GCS verbal subscore is 5. GCS motor subscore is 6.  Gait not tested due to right knee pain.  Some decreased strength to the right leg with a secondary to pain.  Skin: Skin is warm and dry. She is not diaphoretic.  Psychiatric: She has a normal mood and affect. Her behavior is normal.  Nursing note and vitals reviewed.    ED Treatments / Results  Labs (all labs ordered are listed, but only abnormal results are displayed) Labs Reviewed  COMPREHENSIVE METABOLIC PANEL - Abnormal; Notable for the following components:      Result Value   AST 13 (*)    All other components within normal limits  URINALYSIS, ROUTINE W REFLEX MICROSCOPIC - Abnormal; Notable for the following components:   Specific  Gravity, Urine >1.030 (*)    Bilirubin Urine MODERATE (*)    All other components within normal limits  CBC WITH DIFFERENTIAL/PLATELET  LIPASE, BLOOD  PREGNANCY, URINE    EKG None  Radiology No results found.  Procedures Procedures (including critical care time)  Medications Ordered in ED Medications  sodium chloride 0.9 % bolus 1,000 mL ( Intravenous Stopped 09/20/18 1936)  prochlorperazine (COMPAZINE) injection 10 mg (10 mg Intravenous Given 09/20/18 1842)  diphenhydrAMINE (BENADRYL) injection 25 mg (25 mg Intravenous Given 09/20/18 1841)  morphine 2 MG/ML injection 2 mg (2 mg Intravenous Given 09/20/18 1842)     Initial Impression / Assessment and Plan / ED Course  I have reviewed the triage vital signs and the nursing notes.  Pertinent labs & imaging results that were available during my care of the patient were reviewed by me and considered in my medical decision making (see chart for details).     42 yo F with multiple complaints.  She has had abdominal pain going on for the past week associated with a fracture of 2 teeth.  She has some swelling to the left upper cheek.  No palpable area of fluctuance that I can drain.  Will start on antibiotics.  Will check lab work for the abdominal pain.  Benign on my exam do not feel that imaging is warranted currently.  I suspect that she has a right hamstring strain.  Will treat supportively.  Patient had a headache that I think is trapezius spasm.  Pain is reproduced with palpation of the attachment of the trapezius to the skull.  She has a benign neurologic exam.  No trauma.  Feeling better post headache cocktail and fluids.  Tolerating by mouth.  Labwork with no abnormality.  UA negative for infection.  D/c home.   10:32 PM:  I have discussed the diagnosis/risks/treatment options with the patient and believe the pt to be eligible for discharge home to follow-up with PCP. We also discussed returning to the ED immediately if new or  worsening sx occur. We discussed the sx which are most concerning (e.g., sudden worsening pain, fever, inability to tolerate by mouth) that necessitate immediate return. Medications administered to the patient during their visit and any new prescriptions provided to  the patient are listed below.  Medications given during this visit Medications  sodium chloride 0.9 % bolus 1,000 mL ( Intravenous Stopped 09/20/18 1936)  prochlorperazine (COMPAZINE) injection 10 mg (10 mg Intravenous Given 09/20/18 1842)  diphenhydrAMINE (BENADRYL) injection 25 mg (25 mg Intravenous Given 09/20/18 1841)  morphine 2 MG/ML injection 2 mg (2 mg Intravenous Given 09/20/18 1842)      The patient appears reasonably screen and/or stabilized for discharge and I doubt any other medical condition or other [facility] requiring further screening, evaluation, or treatment in the ED at this time prior to discharge.    Final Clinical Impressions(s) / ED Diagnoses   Final diagnoses:  Pain, dental  Trapezius muscle spasm  Strain of right hamstring, initial encounter  Generalized abdominal pain    ED Discharge Orders         Ordered    ondansetron (ZOFRAN ODT) 4 MG disintegrating tablet     09/20/18 1926    clindamycin (CLEOCIN) 150 MG capsule  3 times daily     09/20/18 [place], [place], DO 09/20/18 2232

## 2018-10-21 ENCOUNTER — Encounter (HOSPITAL_BASED_OUTPATIENT_CLINIC_OR_DEPARTMENT_OTHER): Payer: Self-pay | Admitting: Emergency Medicine

## 2018-10-21 ENCOUNTER — Emergency Department (HOSPITAL_BASED_OUTPATIENT_CLINIC_OR_DEPARTMENT_OTHER)
Admission: EM | Admit: 2018-10-21 | Discharge: 2018-10-21 | Disposition: A | Discharge status: Home / Self Care | Payer: BC Managed Care – PPO | Attending: Emergency Medicine | Admitting: Emergency Medicine

## 2018-10-21 ENCOUNTER — Other Ambulatory Visit: Payer: Self-pay

## 2018-10-21 ENCOUNTER — Emergency Department (HOSPITAL_BASED_OUTPATIENT_CLINIC_OR_DEPARTMENT_OTHER): Payer: BC Managed Care – PPO

## 2018-10-21 DIAGNOSIS — R51 Headache: Secondary | ICD-10-CM | POA: Insufficient documentation

## 2018-10-21 DIAGNOSIS — Z79899 Other long term (current) drug therapy: Secondary | ICD-10-CM | POA: Diagnosis not present

## 2018-10-21 DIAGNOSIS — L02811 Cutaneous abscess of head [any part, except face]: Secondary | ICD-10-CM | POA: Insufficient documentation

## 2018-10-21 LAB — CBC WITH DIFFERENTIAL/PLATELET
ABS IMMATURE GRANULOCYTES: 0.02 10*3/uL (ref 0.00–0.07)
BASOS ABS: 0 10*3/uL (ref 0.0–0.1)
Basophils Relative: 0 %
Eosinophils Absolute: 0.1 10*3/uL (ref 0.0–0.5)
Eosinophils Relative: 2 %
HEMATOCRIT: 42.7 % (ref 36.0–46.0)
Hemoglobin: 13.1 g/dL (ref 12.0–15.0)
Immature Granulocytes: 0 %
LYMPHS ABS: 1.7 10*3/uL (ref 0.7–4.0)
Lymphocytes Relative: 30 %
MCH: 28.5 pg (ref 26.0–34.0)
MCHC: 30.7 g/dL (ref 30.0–36.0)
MCV: 93 fL (ref 80.0–100.0)
MONO ABS: 0.3 10*3/uL (ref 0.1–1.0)
Monocytes Relative: 6 %
NEUTROS ABS: 3.4 10*3/uL (ref 1.7–7.7)
Neutrophils Relative %: 62 %
Platelets: 304 10*3/uL (ref 150–400)
RBC: 4.59 MIL/uL (ref 3.87–5.11)
RDW: 13.2 % (ref 11.5–15.5)
WBC: 5.6 10*3/uL (ref 4.0–10.5)
nRBC: 0 % (ref 0.0–0.2)

## 2018-10-21 LAB — COMPREHENSIVE METABOLIC PANEL
ALT: 15 U/L (ref 0–44)
AST: 14 U/L — AB (ref 15–41)
Albumin: 3.9 g/dL (ref 3.5–5.0)
Alkaline Phosphatase: 84 U/L (ref 38–126)
Anion gap: 11 (ref 5–15)
BUN: 12 mg/dL (ref 6–20)
CO2: 26 mmol/L (ref 22–32)
CREATININE: 0.6 mg/dL (ref 0.44–1.00)
Calcium: 9.5 mg/dL (ref 8.9–10.3)
Chloride: 100 mmol/L (ref 98–111)
GFR calc Af Amer: >60 mL/min (ref >60)
Glucose, Bld: 92 mg/dL (ref 70–99)
POTASSIUM: 4 mmol/L (ref 3.5–5.1)
Sodium: 137 mmol/L (ref 135–145)
Total Bilirubin: 0.6 mg/dL (ref 0.3–1.2)
Total Protein: 8.1 g/dL (ref 6.5–8.1)

## 2018-10-21 MED ORDER — LIDOCAINE-EPINEPHRINE (PF) 2 %-1:200000 IJ SOLN
10.0000 mL | Freq: Once | INTRAMUSCULAR | Status: AC
  Administered 2018-10-21: 10 mL via INTRADERMAL
  Filled 2018-10-21 (×2): qty 10

## 2018-10-21 MED ORDER — SULFAMETHOXAZOLE-TRIMETHOPRIM 800-160 MG PO TABS: 1.0000 | ORAL_TABLET | Freq: Two times a day (BID) | ORAL | 0 refills | Status: AC

## 2018-10-21 NOTE — ED Triage Notes (Signed)
Swelling noted to posterior head x 6 months.  Patient say this has been growing in size.  Reports some memory deficits at times.  Denies nausea/vomiting

## 2018-10-21 NOTE — Discharge Instructions (Signed)
The knot you are having to the right side of your head is a possible abscess.  Apply a warm compress to this multiple times per day.  Take the antibiotics as prescribed.  If it gets bigger, you have worse pain, severe headache, a fever, or any other new/concerning symptoms and return to the ER for evaluation.

## 2018-10-21 NOTE — ED Provider Notes (Signed)
[facility] Provider Note   CSN: [id number] Arrival date & time: 10/21/18  1128     History   Chief Complaint Chief Complaint  Patient presents with  . Cyst    HPI [name] is a 42 y.o. female.  HPI  42 year old female presents with a bump to her right occipital scalp with pain.  She has noticed a bump there for about 6 months.  However over the last 1 week or so has become painful.  She denies any skin color changes or warmth.  No fevers.  When the pain flares it becomes severe and causes a headache.  She has chronic blurry vision and wears glasses but thinks her vision has been getting a little worse over several months.  No vomiting or focal weakness/numbness.  She has chronic right leg problems but no new weakness or problems in her leg.  She is tried Tylenol without relief.  She also has gained 10 pounds over the last 1 week.  She denies any hair or skin changes.  Over the past several months she is also noticed some intermittent confusion.  She describes this as having trouble remembering something.  1 times when asked her her age and she could immediately remember and had to look it up.  Other times she will not immediately know the answer to something she typically does and then a few minutes or less later will remember.  Past Medical History:  Diagnosis Date  . Asthma    diagnosed as  a young adult   . IBS (irritable bowel syndrome)   . Rheumatoid arthritis(714.0)     Patient Active Problem List   Diagnosis Date Noted  . Systemic viral illness 11/13/2012  . Morbid obesity with BMI of 40.0-44.9, adult ([place]) 11/13/2012  . Tachycardia 11/13/2012  . Sinusitis, acute frontal 11/13/2012  . Rheumatoid arthritis(714.0)   . Asthma     Past Surgical History:  Procedure Laterality Date  . ABDOMINAL HYSTERECTOMY    . APPENDECTOMY    . BREAST REDUCTION SURGERY    . CHOLECYSTECTOMY    . CYSTECTOMY    . HERNIA REPAIR    . TUBAL  LIGATION       OB History   None      Home Medications    Prior to Admission medications   Medication Sig Start Date End Date Taking? Authorizing Provider  Acetaminophen (TYLENOL PO) Take 2-3 tablets by mouth every 6 (six) hours as needed (pain/headache).    [provider]  albuterol (PROVENTIL HFA;VENTOLIN HFA) 108 (90 BASE) MCG/ACT inhaler Inhale 2 puffs into the lungs every 6 (six) hours as needed for wheezing or shortness of breath.     [provider]  HYDROcodone-acetaminophen (NORCO/VICODIN) 5-325 MG tablet Take 1-2 tablets by mouth every 6 (six) hours as needed. Patient not taking: Reported on 09/04/2017 07/01/17   Muthersbaugh, Jarrett Soho, PA-C  HYDROcodone-acetaminophen (NORCO/VICODIN) 5-325 MG tablet Take 1 tablet by mouth every 6 (six) hours as needed for moderate pain. 08/17/18   Fredia Sorrow, MD  lidocaine (LIDODERM) 5 % Place 1 patch onto the skin daily. Remove & Discard patch within 12 hours or as directed by MD 09/22/17   Joy, Shawn C, PA-C  methocarbamol (ROBAXIN) 500 MG tablet Take 1 tablet (500 mg total) by mouth every 8 (eight) hours as needed for muscle spasms. 09/04/17   Fawze, Mina A, PA-C  methocarbamol (ROBAXIN) 500 MG tablet Take 1 tablet (500 mg total) by mouth  2 (two) times daily. 09/22/17   Joy, Shawn C, PA-C  Multiple Vitamins-Minerals (EMERGEN-C VITAMIN C PO) Take 1,000 mg by mouth daily.    [provider]  ondansetron (ZOFRAN ODT) 4 MG disintegrating tablet 4mg  ODT q4 hours prn nausea/vomit 09/20/18   Deno Etienne, DO  sulfamethoxazole-trimethoprim (BACTRIM DS,SEPTRA DS) 800-160 MG tablet Take 1 tablet by mouth 2 (two) times daily for 5 days. 10/21/18 10/26/18  Sherwood Gambler, MD    Family History Family History  Problem Relation Age of Onset  . Rheum arthritis Father   . Breast cancer Father        died  . Lupus Maternal Aunt   . Rheum arthritis Mother   . Rheum arthritis Maternal Grandmother   . Rheum arthritis Maternal  Grandfather   . Rheum arthritis Paternal Grandfather   . Rheum arthritis Paternal Grandmother     Social History Social History   Tobacco Use  . Smoking status: Never Smoker  . Smokeless tobacco: Never Used  Substance Use Topics  . Alcohol use: No  . Drug use: No     Allergies   Bee venom; Doxycycline; Diclofenac; Toradol [ketorolac tromethamine]; Metoclopramide; and Tramadol   Review of Systems Review of Systems  Constitutional: Positive for unexpected weight change. Negative for fever.  Gastrointestinal: Negative for vomiting.  Musculoskeletal: Negative for neck pain.  Skin: Negative for color change.  Neurological: Positive for headaches. Negative for weakness and numbness.  All other systems reviewed and are negative.    Physical Exam Updated Vital Signs BP (!) 130/97 (BP Location: Right Arm)   Pulse 75   Temp 98.5 F (36.9 C) (Oral)   Resp 16   Ht 5\' 5"  (1.651 m)   Wt 127.9 kg   SpO2 100%   BMI 46.93 kg/m   Physical Exam  Constitutional: She appears well-developed and well-nourished. No distress.  obese  HENT:  Head: Normocephalic and atraumatic.    Right Ear: External ear normal.  Left Ear: External ear normal.  Nose: Nose normal.  Eyes: Pupils are equal, round, and reactive to light. EOM are normal. Right eye exhibits no discharge. Left eye exhibits no discharge.  Neck: Normal range of motion. Neck supple.  Cardiovascular: Normal rate, regular rhythm and normal heart sounds.  Pulmonary/Chest: Effort normal and breath sounds normal.  Abdominal: Soft. There is no tenderness.  Neurological: She is alert.  CN 3-12 grossly intact. 5/5 strength in all 4 extremities. Grossly normal sensation. Normal finger to nose.   Skin: Skin is warm and dry. She is not diaphoretic.  Psychiatric: Her mood appears not anxious.  Nursing note and vitals reviewed.    ED Treatments / Results  Labs (all labs ordered are listed, but only abnormal results are  displayed) Labs Reviewed  COMPREHENSIVE METABOLIC PANEL - Abnormal; Notable for the following components:      Result Value   AST 14 (*)    All other components within normal limits  CBC WITH DIFFERENTIAL/PLATELET    EKG None  Radiology Ct Head Wo Contrast  Result Date: 10/21/2018 CLINICAL DATA:  Painful soft tissue swelling at the base of the skull on the right. EXAM: CT HEAD WITHOUT CONTRAST TECHNIQUE: Contiguous axial images were obtained from the base of the skull through the vertex without intravenous contrast. COMPARISON:  CT head dated June 10, 2013. FINDINGS: Brain: No evidence of acute infarction, hemorrhage, hydrocephalus, extra-axial collection or mass lesion/mass effect. Vascular: No hyperdense vessel or unexpected calcification. Skull: Normal. Negative for fracture  or focal lesion. Sinuses/Orbits: No acute finding. Other: Small focal area of subcutaneous fat stranding extending to the skin on the right at the base of the skull, deep to the BB marker. This is new when compared to prior study from 2014. There are a few nearby superficial veins. IMPRESSION: 1. Nonspecific focal superficial fat stranding on the right at the base of the skull. Given nearby superficial veins, this could reflect superficial thrombophlebitis. A discrete soft tissue mass is not identified. Consider ultrasound for further evaluation. 2.  No acute intracranial abnormality. Electronically Signed   By: Titus Dubin M.D.   On: 10/21/2018 13:05    Procedures .Marland KitchenIncision and Drainage Date/Time: 10/21/2018 2:12 PM Performed by: Sherwood Gambler, MD Authorized by: Sherwood Gambler, MD   Consent:    Consent obtained:  Verbal   Consent given by:  Patient   Risks discussed:  Bleeding, damage to other organs, incomplete drainage, infection and pain   Alternatives discussed:  Alternative treatment Location:    Type:  Abscess   Size:  0.5 cm   Location:  Head   Head location:  Scalp Pre-procedure details:     Skin preparation:  Betadine Procedure type:    Complexity:  Simple Procedure details:    Needle aspiration: yes     Needle size:  18 G   Drainage amount:  Scant   Packing materials:  None Post-procedure details:    Patient tolerance of procedure:  Tolerated well, no immediate complications   (including critical care time)  Medications Ordered in ED Medications  lidocaine-EPINEPHrine (XYLOCAINE W/EPI) 2 %-1:200000 (PF) injection 10 mL (10 mLs Intradermal Given by Other 10/21/18 1333)     Initial Impression / Assessment and Plan / ED Course  I have reviewed the triage vital signs and the nursing notes.  Pertinent labs & imaging results that were available during my care of the patient were reviewed by me and considered in my medical decision making (see chart for details).     Patient's headache seems to be coming from the knot to the right side of her scalp.  Otherwise her neuro exam is pretty unremarkable.  She has describes some confusion episodes.  Why do not think she has an acute CNS infection, I think CT head is reasonable as a screening to make sure there is no obvious mass.  This is essentially negative except for the small area seen on physical exam.  I did bedside ultrasound and it appears there is a dark area that could be fluid just under the skin.  After discussion of options with patient we decided to try and aspirated.  Minimal pus came out.  This could be a very small abscess versus a possible just pimple.  I think is reasonable to try her on some antibiotics and do warm compresses.  Otherwise, as far as her other vague symptoms, her screening lab work is unremarkable.  Thus I will have her follow-up with a PCP and we discussed return precautions.  Final Clinical Impressions(s) / ED Diagnoses   Final diagnoses:  Abscess, scalp    ED Discharge Orders         Ordered    sulfamethoxazole-trimethoprim (BACTRIM DS,SEPTRA DS) 800-160 MG tablet  2 times daily     10/21/18  1410           Sherwood Gambler, MD 10/21/18 1438

## 2018-11-01 ENCOUNTER — Encounter: Payer: Self-pay | Admitting: Internal Medicine

## 2018-11-01 ENCOUNTER — Ambulatory Visit: Payer: BC Managed Care – PPO | Admitting: Adult Health

## 2018-11-01 ENCOUNTER — Encounter: Payer: Self-pay | Admitting: Adult Health

## 2018-11-01 VITALS — BP 118/92 | Temp 98.2°F | Wt 282.0 lb

## 2018-11-01 DIAGNOSIS — Z6841 Body Mass Index (BMI) 40.0 and over, adult: Secondary | ICD-10-CM

## 2018-11-01 DIAGNOSIS — Z7689 Persons encountering health services in other specified circumstances: Secondary | ICD-10-CM | POA: Diagnosis not present

## 2018-11-01 DIAGNOSIS — J4520 Mild intermittent asthma, uncomplicated: Secondary | ICD-10-CM | POA: Diagnosis not present

## 2018-11-01 DIAGNOSIS — Z1239 Encounter for other screening for malignant neoplasm of breast: Secondary | ICD-10-CM

## 2018-11-01 DIAGNOSIS — D234 Other benign neoplasm of skin of scalp and neck: Secondary | ICD-10-CM

## 2018-11-01 DIAGNOSIS — K580 Irritable bowel syndrome with diarrhea: Secondary | ICD-10-CM

## 2018-11-01 DIAGNOSIS — M25561 Pain in right knee: Secondary | ICD-10-CM

## 2018-11-01 DIAGNOSIS — M0609 Rheumatoid arthritis without rheumatoid factor, multiple sites: Secondary | ICD-10-CM | POA: Diagnosis not present

## 2018-11-01 MED ORDER — ALBUTEROL SULFATE HFA 108 (90 BASE) MCG/ACT IN AERS: 2.0000 | INHALATION_SPRAY | Freq: Four times a day (QID) | RESPIRATORY_TRACT | 3 refills | Status: DC | PRN
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
## 2018-11-01 NOTE — Patient Instructions (Signed)
It was great meeting you today   We will refer you to dermatology, orthopedics, GI, and Rheumatology   Please follow up with me for your physical   Call and schedule your mammogram

## 2018-11-16 NOTE — Progress Notes (Deleted)
Office Visit Note  Patient: [name]             Date of Birth: December 07, 1976           MRN: [id number]             PCP: Dorothyann Peng, NP Referring: Dorothyann Peng, NP Visit Date: 11/30/2018 Occupation: @GUAROCC @  Subjective:  No chief complaint on file.   History of Present Illness: [name] is a 42 y.o. female ***   Activities of Daily Living:  Patient reports morning stiffness for *** {minute/hour:19697}.   Patient {ACTIONS;DENIES/REPORTS:21021675::"Denies"} nocturnal pain.  Difficulty dressing/grooming: {ACTIONS;DENIES/REPORTS:21021675::"Denies"} Difficulty climbing stairs: {ACTIONS;DENIES/REPORTS:21021675::"Denies"} Difficulty getting out of chair: {ACTIONS;DENIES/REPORTS:21021675::"Denies"} Difficulty using hands for taps, buttons, cutlery, and/or writing: {ACTIONS;DENIES/REPORTS:21021675::"Denies"}  No Rheumatology ROS completed.   PMFS History:  Patient Active Problem List   Diagnosis Date Noted  . Systemic viral illness 11/13/2012  . Morbid obesity with BMI of 40.0-44.9, adult ([place]) 11/13/2012  . Tachycardia 11/13/2012  . Rheumatoid arthritis ([place])   . Asthma     Past Medical History:  Diagnosis Date  . Asthma    diagnosed as  a young adult   . IBS (irritable bowel syndrome)   . Rheumatoid arthritis(714.0)     Family History  Problem Relation Age of Onset  . Rheum arthritis Father   . Breast cancer Father        died  . Alcohol abuse Father   . Lupus Maternal Aunt   . Rheum arthritis Mother   . Rheum arthritis Maternal Grandmother   . Cancer Maternal Grandmother   . Rheum arthritis Maternal Grandfather   . Rheum arthritis Paternal Grandfather   . Rheum arthritis Paternal Grandmother    Past Surgical History:  Procedure Laterality Date  . ABDOMINAL HYSTERECTOMY    . APPENDECTOMY    . BREAST REDUCTION SURGERY    . CHOLECYSTECTOMY    . CYSTECTOMY    . HERNIA REPAIR    . TUBAL LIGATION     Social History   Social History  Narrative   Her father is 39 and has had a history of bleeding ulcers.    Her mother is 23 and has uterine fibroids and had some endometrial    dysplasia.  She has a 70 year old brother and a 67 year old sister who are    healthy.  There is no known family history of colon, breast, or ovarian    cancer.  Her paternal grandmother has heart disease.  There is history of    diabetes in several members on both sides of the family.      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   Physical Exam   Musculoskeletal Exam: ***  CDAI Exam: CDAI Score: Not documented Patient Global Assessment: Not documented; Provider Global Assessment: Not documented Swollen: Not documented; Tender: Not documented Joint Exam   Not documented   There is currently no information documented on the homunculus. Go to the Rheumatology activity and complete the homunculus joint exam.  Investigation: No additional findings.  Imaging: Ct Head Wo Contrast  Result Date: 10/21/2018 CLINICAL DATA:  Painful soft tissue swelling at the base of the skull on the right. EXAM: CT HEAD WITHOUT CONTRAST TECHNIQUE: Contiguous axial images were obtained from the base of the skull  through the vertex without intravenous contrast. COMPARISON:  CT head dated June 10, 2013. FINDINGS: Brain: No evidence of acute infarction, hemorrhage, hydrocephalus, extra-axial collection or mass lesion/mass effect. Vascular: No hyperdense vessel or unexpected calcification. Skull: Normal. Negative for fracture or focal lesion. Sinuses/Orbits: No acute finding. Other: Small focal area of subcutaneous fat stranding extending to the skin on the right at the base of the skull, deep to the BB marker. This is new when compared to prior study from 2014. There are a few nearby superficial veins. IMPRESSION: 1. Nonspecific focal  superficial fat stranding on the right at the base of the skull. Given nearby superficial veins, this could reflect superficial thrombophlebitis. A discrete soft tissue mass is not identified. Consider ultrasound for further evaluation. 2.  No acute intracranial abnormality. Electronically Signed   By: Titus Dubin M.D.   On: 10/21/2018 13:05    Recent Labs: Lab Results  Component Value Date   WBC 5.6 10/21/2018   HGB 13.1 10/21/2018   PLT 304 10/21/2018   NA 137 10/21/2018   K 4.0 10/21/2018   CL 100 10/21/2018   CO2 26 10/21/2018   GLUCOSE 92 10/21/2018   BUN 12 10/21/2018   CREATININE 0.60 10/21/2018   BILITOT 0.6 10/21/2018   ALKPHOS 84 10/21/2018   AST 14 (L) 10/21/2018   ALT 15 10/21/2018   PROT 8.1 10/21/2018   ALBUMIN 3.9 10/21/2018   CALCIUM 9.5 10/21/2018   GFRAA >60 10/21/2018    Speciality Comments: No specialty comments available.  Procedures:  No procedures performed Allergies: Bee venom; Doxycycline; Diclofenac; Toradol [ketorolac tromethamine]; Metoclopramide; and Tramadol   Assessment / Plan:     Visit Diagnoses: Rheumatoid arthritis of multiple sites with negative rheumatoid factor (HCC)  History of asthma  History of IBS   Orders: No orders of the defined types were placed in this encounter.  No orders of the defined types were placed in this encounter.   Face-to-face time spent with patient was *** minutes. Greater than 50% of time was spent in counseling and coordination of care.  Follow-Up Instructions: No follow-ups on file.   Ofilia Neas, PA-C  Note - This record has been created using Dragon software.  Chart creation errors have been sought, but may not always  have been located. Such creation errors do not reflect on  the standard of medical care.

## 2018-11-25 ENCOUNTER — Ambulatory Visit: Payer: BC Managed Care – PPO | Admitting: Rheumatology

## 2018-11-28 ENCOUNTER — Ambulatory Visit: Payer: BC Managed Care – PPO | Admitting: Internal Medicine

## 2018-11-28 ENCOUNTER — Ambulatory Visit (INDEPENDENT_AMBULATORY_CARE_PROVIDER_SITE_OTHER): Payer: BC Managed Care – PPO | Admitting: Orthopaedic Surgery

## 2018-11-28 ENCOUNTER — Encounter (INDEPENDENT_AMBULATORY_CARE_PROVIDER_SITE_OTHER): Payer: Self-pay | Admitting: Orthopaedic Surgery

## 2018-11-28 DIAGNOSIS — G8929 Other chronic pain: Secondary | ICD-10-CM

## 2018-11-28 DIAGNOSIS — M25561 Pain in right knee: Secondary | ICD-10-CM | POA: Diagnosis not present
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
## 2018-11-30 ENCOUNTER — Ambulatory Visit: Payer: BC Managed Care – PPO | Admitting: Rheumatology

## 2018-11-30 ENCOUNTER — Other Ambulatory Visit (INDEPENDENT_AMBULATORY_CARE_PROVIDER_SITE_OTHER): Payer: Self-pay

## 2018-11-30 DIAGNOSIS — M25561 Pain in right knee: Principal | ICD-10-CM

## 2018-11-30 DIAGNOSIS — G8929 Other chronic pain: Secondary | ICD-10-CM

## 2018-12-13 ENCOUNTER — Other Ambulatory Visit: Payer: BC Managed Care – PPO

## 2018-12-19 ENCOUNTER — Ambulatory Visit (INDEPENDENT_AMBULATORY_CARE_PROVIDER_SITE_OTHER): Payer: BC Managed Care – PPO | Admitting: Orthopaedic Surgery

## 2018-12-19 ENCOUNTER — Encounter (HOSPITAL_COMMUNITY): Payer: Self-pay

## 2018-12-19 ENCOUNTER — Ambulatory Visit (HOSPITAL_COMMUNITY)
Admission: EM | Admit: 2018-12-19 | Discharge: 2018-12-19 | Disposition: A | Discharge status: Home / Self Care | Payer: BC Managed Care – PPO | Attending: Internal Medicine | Admitting: Internal Medicine

## 2018-12-19 ENCOUNTER — Other Ambulatory Visit: Payer: Self-pay

## 2018-12-19 DIAGNOSIS — J4520 Mild intermittent asthma, uncomplicated: Secondary | ICD-10-CM

## 2018-12-19 DIAGNOSIS — R69 Illness, unspecified: Secondary | ICD-10-CM | POA: Insufficient documentation

## 2018-12-19 DIAGNOSIS — J111 Influenza due to unidentified influenza virus with other respiratory manifestations: Secondary | ICD-10-CM

## 2018-12-19 MED ORDER — PREDNISONE 50 MG PO TABS: 50.0000 mg | ORAL_TABLET | Freq: Every day | ORAL | 0 refills | Status: DC

## 2018-12-19 MED ORDER — PROMETHAZINE-CODEINE 6.25-10 MG/5ML PO SYRP: 5.0000 mL | ORAL_SOLUTION | ORAL | 0 refills | Status: DC | PRN

## 2018-12-19 MED ORDER — OSELTAMIVIR PHOSPHATE 75 MG PO CAPS: 75.0000 mg | ORAL_CAPSULE | Freq: Two times a day (BID) | ORAL | 0 refills | Status: DC

## 2018-12-19 MED ORDER — ALBUTEROL SULFATE HFA 108 (90 BASE) MCG/ACT IN AERS: 1.0000 | INHALATION_SPRAY | RESPIRATORY_TRACT | 0 refills | Status: DC | PRN

## 2018-12-19 NOTE — ED Triage Notes (Signed)
Pt cc body aches, runny nose and SOB. Pt has body aches. This started yesterday.

## 2018-12-19 NOTE — Discharge Instructions (Addendum)
No danger signs on exam.  Prescriptions for oseltamivir (flu medicine), cough syrup, and prednisone (steroid) were sent to the pharmacy.  Anticipate gradual improvement in cough, head congestion, headache, and well being, over the next several days.  Cough may take a couple weeks to subside.  Push fluids and rest.  Note for work, return to work on 12/22/2018.  Recheck for new fever >100.5, increasing phlegm production/nasal discharge, or if not starting to improve in a few days.

## 2018-12-19 NOTE — ED Provider Notes (Signed)
[facility]    CSN: [id number] Arrival date & time: 12/19/18  [place]     History   Chief Complaint Chief Complaint  Patient presents with  . Cough    HPI [name] is a 43 y.o. female.   She presents today with a sudden onset yesterday of widespread myalgias, headache, head congestion/runny nose, cough, sore throat.  She has central chest tightness.  Feels terrible.  She had a flu shot, but wonders if she has the flu.  She works in a school, in administration.  She was sent home from work early today because of illness.    HPI  Past Medical History:  Diagnosis Date  . Asthma    diagnosed as  a young adult   . IBS (irritable bowel syndrome)   . Rheumatoid arthritis(714.0)     Patient Active Problem List   Diagnosis Date Noted  . Systemic viral illness 11/13/2012  . Morbid obesity with BMI of 40.0-44.9, adult ([place]) 11/13/2012  . Tachycardia 11/13/2012  . Rheumatoid arthritis ([place])   . Asthma     Past Surgical History:  Procedure Laterality Date  . ABDOMINAL HYSTERECTOMY    . APPENDECTOMY    . BREAST REDUCTION SURGERY    . CHOLECYSTECTOMY    . CYSTECTOMY    . HERNIA REPAIR    . TUBAL LIGATION        Home Medications    Prior to Admission medications   Medication Sig Start Date End Date Taking? Authorizing Provider  Acetaminophen (TYLENOL PO) Take 2-3 tablets by mouth every 6 (six) hours as needed (pain/headache).    [provider]  albuterol (PROVENTIL HFA;VENTOLIN HFA) 108 (90 Base) MCG/ACT inhaler Inhale 2 puffs into the lungs every 6 (six) hours as needed for wheezing or shortness of breath. 11/01/18   Nafziger, [name] Rumps, NP  albuterol (PROVENTIL HFA;VENTOLIN HFA) 108 (90 Base) MCG/ACT inhaler Inhale 1-2 puffs into the lungs every 4 (four) hours as needed for wheezing or shortness of breath. 12/19/18   Meghan Luna, MD  Multiple Vitamins-Minerals (EMERGEN-C VITAMIN C PO) Take 1,000 mg by mouth daily.    [provider]    oseltamivir (TAMIFLU) 75 MG capsule Take 1 capsule (75 mg total) by mouth every 12 (twelve) hours. 12/19/18   Meghan Luna, MD  predniSONE (DELTASONE) 50 MG tablet Take 1 tablet (50 mg total) by mouth daily. 12/19/18   Meghan Luna, MD  promethazine-codeine [facility] WITH CODEINE) 6.25-10 MG/5ML syrup Take 5 mLs by mouth every 4 (four) hours as needed for cough. 12/19/18   Meghan Luna, MD    Family History Family History  Problem Relation Age of Onset  . Rheum arthritis Father   . Breast cancer Father        died  . Alcohol abuse Father   . Lupus Maternal Aunt   . Rheum arthritis Mother   . Rheum arthritis Maternal Grandmother   . Cancer Maternal Grandmother   . Rheum arthritis Maternal Grandfather   . Rheum arthritis Paternal Grandfather   . Rheum arthritis Paternal Grandmother     Social History Social History   Tobacco Use  . Smoking status: Never Smoker  . Smokeless tobacco: Never Used  Substance Use Topics  . Alcohol use: No  . Drug use: No     Allergies   Bee venom; Doxycycline; Diclofenac; Toradol [ketorolac tromethamine]; Metoclopramide; and Tramadol   Review of Systems Review of Systems  All other systems reviewed and  are negative.    Physical Exam Triage Vital Signs ED Triage Vitals  Enc Vitals Group     BP --      Pulse Rate 12/19/18 1730 90     Resp 12/19/18 1730 18     Temp 12/19/18 1730 98.1 F (36.7 C)     Temp Source 12/19/18 1730 Oral     SpO2 12/19/18 1730 100 %     Weight 12/19/18 1731 280 lb (127 kg)     Height --      Pain Score 12/19/18 1731 8     Pain Loc --    Updated Vital Signs Pulse 90   Temp 98.1 F (36.7 C) (Oral)   Resp 18   Wt 127 kg   SpO2 100%   BMI 46.59 kg/m  Physical Exam Vitals signs and nursing note reviewed.  Constitutional:      Comments: Alert, nicely groomed Looks ill but not toxic Very frequent nose blowing and coughing during the exam Sounds very congested  HENT:     Head:  Atraumatic.     Comments: Bilateral TMs are quite dull, no erythema Marked nasal congestion bilaterally with mucopurulent material present Posterior pharynx is red Eyes:     Comments: Conjugate gaze, no eye redness/drainage  Neck:     Musculoskeletal: Neck supple.  Cardiovascular:     Rate and Rhythm: Normal rate and regular rhythm.  Pulmonary:     Effort: No respiratory distress.     Breath sounds: No wheezing or rales.     Comments: Coarse but symmetric breath sounds throughout Abdominal:     General: There is no distension.  Musculoskeletal: Normal range of motion.     Comments: No leg swelling  Skin:    General: Skin is warm and dry.     Comments: No cyanosis  Neurological:     Mental Status: She is alert and oriented to person, place, and time.       Final Clinical Impressions(s) / UC Diagnoses   Final diagnoses:  Influenza-like illness  history of asthma   Discharge Instructions     No danger signs on exam.  Prescriptions for oseltamivir (flu medicine), cough syrup, and prednisone (steroid) were sent to the pharmacy.  Anticipate gradual improvement in cough, head congestion, headache, and well being, over the next several days.  Cough may take a couple weeks to subside.  Push fluids and rest.  Note for work, return to work on 12/22/2018.  Recheck for new fever >100.5, increasing phlegm production/nasal discharge, or if not starting to improve in a few days.      ED Prescriptions    Medication Sig Dispense Auth. Provider   oseltamivir (TAMIFLU) 75 MG capsule Take 1 capsule (75 mg total) by mouth every 12 (twelve) hours. 10 capsule Meghan Luna, MD   predniSONE (DELTASONE) 50 MG tablet Take 1 tablet (50 mg total) by mouth daily. 3 tablet Meghan Luna, MD   promethazine-codeine [facility] WITH CODEINE) 6.25-10 MG/5ML syrup Take 5 mLs by mouth every 4 (four) hours as needed for cough. 180 mL Meghan Luna, MD   albuterol (PROVENTIL HFA;VENTOLIN HFA)  108 (90 Base) MCG/ACT inhaler Inhale 1-2 puffs into the lungs every 4 (four) hours as needed for wheezing or shortness of breath. 1 Inhaler Meghan Luna, MD     Controlled Substance Prescriptions [place] Controlled Substance Registry consulted? Yes, I have consulted the [name] Controlled Substances Registry for this patient, and feel the risk/benefit ratio  today is favorable for proceeding with this prescription for a controlled substance.   Meghan Luna, MD 12/20/18 [phone]

## 2018-12-29 ENCOUNTER — Ambulatory Visit
Admission: RE | Admit: 2018-12-29 | Discharge: 2018-12-29 | Disposition: A | Discharge status: Home / Self Care | Payer: BC Managed Care – PPO | Source: Ambulatory Visit | Attending: Orthopaedic Surgery | Admitting: Orthopaedic Surgery

## 2018-12-29 DIAGNOSIS — M25561 Pain in right knee: Principal | ICD-10-CM

## 2018-12-29 DIAGNOSIS — G8929 Other chronic pain: Secondary | ICD-10-CM

## 2019-01-05 ENCOUNTER — Encounter (INDEPENDENT_AMBULATORY_CARE_PROVIDER_SITE_OTHER): Payer: Self-pay | Admitting: Orthopaedic Surgery
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
## 2019-01-26 ENCOUNTER — Ambulatory Visit: Payer: BC Managed Care – PPO

## 2019-01-27 ENCOUNTER — Ambulatory Visit: Payer: BC Managed Care – PPO | Admitting: Rheumatology

## 2019-02-02 ENCOUNTER — Encounter: Payer: BC Managed Care – PPO | Admitting: Adult Health

## 2019-02-20 ENCOUNTER — Ambulatory Visit: Payer: BC Managed Care – PPO

## 2020-03-18 ENCOUNTER — Other Ambulatory Visit: Payer: Self-pay

## 2020-03-18 ENCOUNTER — Ambulatory Visit: Payer: BC Managed Care – PPO | Admitting: Physician Assistant

## 2020-03-18 VITALS — BP 138/88 | HR 85 | Temp 98.8°F | Resp 18 | Ht 65.0 in | Wt 287.0 lb

## 2020-03-18 DIAGNOSIS — M545 Low back pain, unspecified: Secondary | ICD-10-CM

## 2020-03-18 DIAGNOSIS — R519 Headache, unspecified: Secondary | ICD-10-CM

## 2020-03-18 DIAGNOSIS — R221 Localized swelling, mass and lump, neck: Secondary | ICD-10-CM

## 2020-03-18 DIAGNOSIS — R1310 Dysphagia, unspecified: Secondary | ICD-10-CM

## 2020-03-18 DIAGNOSIS — R609 Edema, unspecified: Secondary | ICD-10-CM

## 2020-03-18 DIAGNOSIS — G4701 Insomnia due to medical condition: Secondary | ICD-10-CM

## 2020-03-18 DIAGNOSIS — G8929 Other chronic pain: Secondary | ICD-10-CM

## 2020-03-18 MED ORDER — OMEPRAZOLE 20 MG PO CPDR: 20.0000 mg | DELAYED_RELEASE_CAPSULE | Freq: Every day | ORAL | 3 refills | Status: DC

## 2020-03-18 MED ORDER — CYCLOBENZAPRINE HCL 10 MG PO TABS: 10.0000 mg | ORAL_TABLET | Freq: Three times a day (TID) | ORAL | 0 refills | Status: DC | PRN

## 2020-03-18 MED ORDER — TRAZODONE HCL 50 MG PO TABS: 25.0000 mg | ORAL_TABLET | Freq: Every evening | ORAL | 3 refills | Status: DC | PRN

## 2020-03-18 NOTE — Patient Instructions (Signed)
For your low back pain, I have sent an order for you to get an x-ray at [facility], the last x-ray you had was in 2018, this will be helpful for neurology for review.  I sent a muscle relaxer, Flexeril 10 mg, to your pharmacy, you can use this 3 times a day.  I also am enclosing some lower back stretches that you can try,  Due to your edema, please increase water intake, lower sodium intake, I enclosed information on Mediterranean style diet, this should help resolve the edema.  Check your blood pressure at home, keep a log, take this with you to your next primary care appointment.  I recommend that she schedule a primary care appointment within the next month so you can have someone help you follow-up with these referrals.  The nodule on the back of your neck, has had ultrasound and CT, however I am going to order another ultrasound of it and send those results to your primary care provider for further evaluation.  Due to the chronic headaches and the low back pain, I am sending referral referral to neurology, they could be related.  I also recommend starting vitamin D over-the-counter, take this in addition to an every day vitamin, I recommend 2000 units a day.  This can be helpful for headaches, insomnia, bone health.  To help with your insomnia, I recommend trazodone 50 mg, try 25 mg at first, make sure that she you have good sleep hygiene, quiet dark, no electronics   Mediterranean Diet A Mediterranean diet refers to food and lifestyle choices that are based on the traditions of countries located on the [employer]. This way of eating has been shown to help prevent certain conditions and improve outcomes for people who have chronic diseases, like kidney disease and heart disease. What are tips for following this plan? Lifestyle  Cook and eat meals together with your family, when possible.  Drink enough fluid to keep your urine clear or pale yellow.  Be physically active every  day. This includes: ? Aerobic exercise like running or swimming. ? Leisure activities like gardening, walking, or housework.  Get 7-8 hours of sleep each night.  If recommended by your health care provider, drink red wine in moderation. This means 1 glass a day for nonpregnant women and 2 glasses a day for men. A glass of wine equals 5 oz (150 mL). Reading food labels   Check the serving size of packaged foods. For foods such as rice and pasta, the serving size refers to the amount of cooked product, not dry.  Check the total fat in packaged foods. Avoid foods that have saturated fat or trans fats.  Check the ingredients list for added sugars, such as corn syrup. Shopping  At the grocery store, buy most of your food from the areas near the walls of the store. This includes: ? Fresh fruits and vegetables (produce). ? Grains, beans, nuts, and seeds. Some of these may be available in unpackaged forms or large amounts (in bulk). ? Fresh seafood. ? Poultry and eggs. ? Low-fat dairy products.  Buy whole ingredients instead of prepackaged foods.  Buy fresh fruits and vegetables in-season from local farmers markets.  Buy frozen fruits and vegetables in resealable bags.  If you do not have access to quality fresh seafood, buy precooked frozen shrimp or canned fish, such as tuna, salmon, or sardines.  Buy small amounts of raw or cooked vegetables, salads, or olives from the deli or  salad bar at your store.  Stock your pantry so you always have certain foods on hand, such as olive oil, canned tuna, canned tomatoes, rice, pasta, and beans. Cooking  Cook foods with extra-virgin olive oil instead of using butter or other vegetable oils.  Have meat as a side dish, and have vegetables or grains as your main dish. This means having meat in small portions or adding small amounts of meat to foods like pasta or stew.  Use beans or vegetables instead of meat in common dishes like chili or  lasagna.  Experiment with different cooking methods. Try roasting or broiling vegetables instead of steaming or sauteing them.  Add frozen vegetables to soups, stews, pasta, or rice.  Add nuts or seeds for added healthy fat at each meal. You can add these to yogurt, salads, or vegetable dishes.  Marinate fish or vegetables using olive oil, lemon juice, garlic, and fresh herbs. Meal planning   Plan to eat 1 vegetarian meal one day each week. Try to work up to 2 vegetarian meals, if possible.  Eat seafood 2 or more times a week.  Have healthy snacks readily available, such as: ? Vegetable sticks with hummus. ? [place] yogurt. ? Fruit and nut trail mix.  Eat balanced meals throughout the week. This includes: ? Fruit: 2-3 servings a day ? Vegetables: 4-5 servings a day ? Low-fat dairy: 2 servings a day ? Fish, poultry, or lean meat: 1 serving a day ? Beans and legumes: 2 or more servings a week ? Nuts and seeds: 1-2 servings a day ? Whole grains: 6-8 servings a day ? Extra-virgin olive oil: 3-4 servings a day  Limit red meat and sweets to only a few servings a month What are my food choices?  Mediterranean diet ? Recommended  Grains: Whole-grain pasta. Brown rice. Bulgar wheat. Polenta. Couscous. Whole-wheat bread. [place].  Vegetables: Artichokes. Beets. Broccoli. Cabbage. Carrots. Eggplant. Green beans. Chard. Kale. Spinach. Onions. Leeks. Peas. Squash. Tomatoes. Peppers. Radishes.  Fruits: Apples. Apricots. Avocado. Berries. Bananas. Cherries. Dates. Figs. Grapes. Lemons. Melon. Oranges. Peaches. Plums. Pomegranate.  Meats and other protein foods: Beans. Almonds. Sunflower seeds. Pine nuts. Peanuts. [place]. Salmon. Scallops. Shrimp. [place]. Tilapia. Clams. Oysters. Eggs.  Dairy: Low-fat milk. Cheese. Greek yogurt.  Beverages: Water. Red wine. Herbal tea.  Fats and oils: Extra virgin olive oil. Avocado oil. Grape seed oil.  Sweets and desserts: [place] yogurt with  honey. Baked apples. Poached pears. Trail mix.  Seasoning and other foods: Basil. Cilantro. Coriander. Cumin. Mint. Parsley. Sage. Rosemary. Tarragon. Garlic. Oregano. Thyme. Pepper. Balsalmic vinegar. Tahini. Hummus. Tomato sauce. Olives. Mushrooms. ? Limit these  Grains: Prepackaged pasta or rice dishes. Prepackaged cereal with added sugar.  Vegetables: Deep fried potatoes (french fries).  Fruits: Fruit canned in syrup.  Meats and other protein foods: Beef. Pork. Lamb. Poultry with skin. Hot dogs. Bernie[place] Salines.  Dairy: Ice cream. Sour cream. Whole milk.  Beverages: Juice. Sugar-sweetened soft drinks. Beer. Liquor and spirits.  Fats and oils: Butter. Canola oil. Vegetable oil. Beef fat (tallow). Lard.  Sweets and desserts: Cookies. Cakes. Pies. Candy.  Seasoning and other foods: Mayonnaise. Premade sauces and marinades. The items listed may not be a complete list. Talk with your dietitian about what dietary choices are right for you. Summary  The Mediterranean diet includes both food and lifestyle choices.  Eat a variety of fresh fruits and vegetables, beans, nuts, seeds, and whole grains.  Limit the amount of red meat and sweets that you eat.  Talk with your health care provider about whether it is safe for you to drink red wine in moderation. This means 1 glass a day for nonpregnant women and 2 glasses a day for men. A glass of wine equals 5 oz (150 mL). This information is not intended to replace advice given to you by your health care provider. Make sure you discuss any questions you have with your health care provider. Document Revised: 07/30/2016 Document Reviewed: 07/23/2016 Elsevier Patient Education  [place].  Chronic Back Pain When back pain lasts longer than 3 months, it is called chronic back pain.The cause of your back pain may not be known. Some common causes include:  Wear and tear (degenerative disease) of the bones, ligaments, or disks in your  back.  Inflammation and stiffness in your back (arthritis). People who have chronic back pain often go through certain periods in which the pain is more intense (flare-ups). Many people can learn to manage the pain with home care. Follow these instructions at home: Pay attention to any changes in your symptoms. Take these actions to help with your pain: Activity   Avoid bending and other activities that make the problem worse.  Maintain a proper position when standing or sitting: ? When standing, keep your upper back and neck straight, with your shoulders pulled back. Avoid slouching. ? When sitting, keep your back straight and relax your shoulders. Do not round your shoulders or pull them backward.  Do not sit or stand in one place for long periods of time.  Take brief periods of rest throughout the day. This will reduce your pain. Resting in a lying or standing position is usually better than sitting to rest.  When you are resting for longer periods, mix in some mild activity or stretching between periods of rest. This will help to prevent stiffness and pain.  Get regular exercise. Ask your health care provider what activities are safe for you.  Do not lift anything that is heavier than 10 lb (4.5 kg). Always use proper lifting technique, which includes: ? Bending your knees. ? Keeping the load close to your body. ? Avoiding twisting.  Sleep on a firm mattress in a comfortable position. Try lying on your side with your knees slightly bent. If you lie on your back, put a pillow under your knees. Managing pain  If directed, apply ice to the painful area. Your health care provider may recommend applying ice during the first 24-48 hours after a flare-up begins. ? Put ice in a plastic bag. ? Place a towel between your skin and the bag. ? Leave the ice on for 20 minutes, 2-3 times per day.  If directed, apply heat to the affected area as often as told by your health care provider. Use  the heat source that your health care provider recommends, such as a moist heat pack or a heating pad. ? Place a towel between your skin and the heat source. ? Leave the heat on for 20-30 minutes. ? Remove the heat if your skin turns bright red. This is especially important if you are unable to feel pain, heat, or cold. You may have a greater risk of getting burned.  Try soaking in a warm tub.  Take over-the-counter and prescription medicines only as told by your health care provider.  Keep all follow-up visits as told by your health care provider. This is important. Contact a health care provider if:  You have pain that is not relieved  with rest or medicine. Get help right away if:  You have weakness or numbness in one or both of your legs or feet.  You have trouble controlling your bladder or your bowels.  You have nausea or vomiting.  You have pain in your abdomen.  You have shortness of breath or you faint. This information is not intended to replace advice given to you by your health care provider. Make sure you discuss any questions you have with your health care provider. Document Revised: 03/23/2019 Document Reviewed: 06/09/2017 Elsevier Patient Education  [place].

## 2020-03-18 NOTE — Progress Notes (Signed)
New Patient Office Visit  Subjective:  Patient ID: [name], female    DOB: 01/18/1976  Age: 44 y.o. MRN: [id number]  CC:  Chief Complaint  Patient presents with  . Back Pain    HPI [name] reports that she started noticing a bump on the back of her neck approximately 1.5 years ago, states that she did have it evaluated at the emergency department and was told to" follow-up with dermatology".  Reports that she feels that it is causing her to have chronic headaches, states that she has tried ibuprofen and Tylenol without relief for the headaches.  Reports that headaches have become daily, debilitating, makes it hard for her to work.  Endorses that she does not drink very much water.  Reports that she suffers from degenerative disc disease, has chronic lower back pain, states that it is made it difficult for her to exercise, hard to bend over.  Denies numbness or tingling, states the pain does not radiate down either leg.  Reports that she has previously taken muscle relaxers with relief.  Reports that she has been having difficulty sleeping due to the pain, difficulty falling asleep, difficulty staying asleep.  Has failed melatonin and Benadryl.  Reports that she has been having swelling in her ankles, swelling in her hands.  Reports that she was previously diagnosed with rheumatoid arthritis, does not take any medication for relief.  Reports that she has been having difficulty swallowing, states that it feels like something is stuck, is able to eat and drink and swallow pills, but does feel that it sometimes is difficult to swallow.  Has not tried anything for relief   Past Medical History:  Diagnosis Date  . Asthma    diagnosed as  a young adult   . IBS (irritable bowel syndrome)   . Rheumatoid arthritis(714.0)     Past Surgical History:  Procedure Laterality Date  . ABDOMINAL HYSTERECTOMY    . APPENDECTOMY    . BREAST REDUCTION SURGERY    . CHOLECYSTECTOMY     . CYSTECTOMY    . HERNIA REPAIR    . TUBAL LIGATION      Family History  Problem Relation Age of Onset  . Rheum arthritis Father   . Breast cancer Father        died  . Alcohol abuse Father   . Lupus Maternal Aunt   . Rheum arthritis Mother   . Rheum arthritis Maternal Grandmother   . Cancer Maternal Grandmother   . Rheum arthritis Maternal Grandfather   . Rheum arthritis Paternal Grandfather   . Rheum arthritis Paternal Grandmother     Social History   Socioeconomic History  . Marital status: Divorced    Spouse name: Not on file  . Number of children: Not on file  . Years of education: Not on file  . Highest education level: Not on file  Occupational History  . Not on file  Tobacco Use  . Smoking status: Never Smoker  . Smokeless tobacco: Never Used  Substance and Sexual Activity  . Alcohol use: No  . Drug use: No  . Sexual activity: Not Currently  Other Topics Concern  . Not on file  Social History Narrative   Her father is 37 and has had a history of bleeding ulcers.    Her mother is 57 and has uterine fibroids and had some endometrial    dysplasia.  She has a 55 year old brother and a 21 year old sister who are  healthy.  There is no known family history of colon, breast, or ovarian    cancer.  Her paternal grandmother has heart disease.  There is history of    diabetes in several members on both sides of the family.      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 Difficulty of Paying Living Expenses:   Food Insecurity:   . Worried About Charity fundraiser in the Last Year:   . Arboriculturist in the Last Year:   Transportation Needs:   . Film/video editor (Medical):   Marland Kitchen Lack of Transportation (Non-Medical):   Physical Activity:   . Days of Exercise per Week:   . Minutes of Exercise per Session:   Stress:   . Feeling of  Stress :   Social Connections:   . Frequency of Communication with Friends and Family:   . Frequency of Social Gatherings with Friends and Family:   . Attends Religious Services:   . Active Member of Clubs or Organizations:   . Attends Archivist Meetings:   Marland Kitchen Marital Status:   Intimate Partner Violence:   . Fear of Current or Ex-Partner:   . Emotionally Abused:   Marland Kitchen Physically Abused:   . Sexually Abused:     ROS Review of Systems  Constitutional: Positive for fatigue.  HENT: Positive for trouble swallowing.   Eyes: Negative.   Respiratory: Negative.   Cardiovascular: Positive for leg swelling.  Gastrointestinal: Negative.   Endocrine: Negative.   Genitourinary: Negative.   Musculoskeletal: Positive for arthralgias, back pain, myalgias and neck pain.  Skin: Negative.   Allergic/Immunologic: Negative.   Neurological: Positive for headaches.  Hematological: Negative.   Psychiatric/Behavioral: Positive for sleep disturbance. Negative for suicidal ideas.    Objective:   Today's Vitals: BP 138/88 (BP Location: Left Arm, Patient Position: Sitting, Cuff Size: Large)   Pulse 85   Temp 98.8 F (37.1 C) (Oral)   Resp 18   Ht 5\' 5"  (1.651 m)   Wt 287 lb (130.2 kg)   SpO2 100%   BMI 47.76 kg/m   Physical Exam Vitals and nursing note reviewed.  Constitutional:      Appearance: Normal appearance. She is obese.  HENT:     Head: Normocephalic and atraumatic.     Right Ear: External ear normal.     Left Ear: External ear normal.     Mouth/Throat:     Mouth: Mucous membranes are moist.     Pharynx: Oropharynx is clear.  Eyes:     Extraocular Movements: Extraocular movements intact.     Conjunctiva/sclera: Conjunctivae normal.     Pupils: Pupils are equal, round, and reactive to light.  Neck:     Thyroid: No thyroid mass, thyromegaly or thyroid tenderness.   Cardiovascular:     Rate and Rhythm: Normal rate and regular rhythm.     Pulses: Normal pulses.      Heart sounds: Normal heart sounds.  Pulmonary:     Effort: Pulmonary effort is normal.     Breath sounds: Normal breath sounds.  Abdominal:     General: Abdomen is flat. Bowel sounds are normal.     Palpations: Abdomen is soft.  Musculoskeletal:     Right hand: Tenderness present.  Left hand: Tenderness present.     Cervical back: Pain with movement present. Decreased range of motion.     Thoracic back: Normal.     Lumbar back: Decreased range of motion.     Right lower leg: 1+ Pitting Edema present.     Left lower leg: 1+ Pitting Edema present.  Lymphadenopathy:     Cervical: No cervical adenopathy.     Right cervical: No superficial or deep cervical adenopathy.    Left cervical: No superficial or deep cervical adenopathy.  Neurological:     Mental Status: She is alert.  Psychiatric:        Mood and Affect: Mood is anxious.        Behavior: Behavior normal.        Thought Content: Thought content normal.        Judgment: Judgment normal.     Assessment & Plan:   Problem List Items Addressed This Visit    None    Visit Diagnoses    Chronic midline low back pain without sciatica    -  Primary   Relevant Medications   cyclobenzaprine (FLEXERIL) 10 MG tablet   Other Relevant Orders   Ambulatory referral to Neurology   DG Lumbar Spine Complete   Insomnia due to medical condition       Relevant Medications   traZODone (DESYREL) 50 MG tablet   Nodule of neck       Relevant Orders   CBC with Differential/Platelet   US Soft Tissue Head/Neck (NON-THYROID)   Generalized headaches       Relevant Medications   cyclobenzaprine (FLEXERIL) 10 MG tablet   traZODone (DESYREL) 50 MG tablet   Other Relevant Orders   CBC with Differential/Platelet   Comp. Metabolic Panel (12)   TSH   Ambulatory referral to Neurology   Morbid obesity ([place])       Relevant Orders   Comp. Metabolic Panel (12)   Edema, unspecified type       Relevant Orders   Comp. Metabolic Panel (12)   TSH     Dysphagia, unspecified type       Relevant Medications   omeprazole (PRILOSEC) 20 MG capsule      Outpatient Encounter Medications as of 03/18/2020  Medication Sig  . Acetaminophen (TYLENOL PO) Take 2-3 tablets by mouth every 6 (six) hours as needed (pain/headache).  Marland Kitchen albuterol (PROVENTIL HFA;VENTOLIN HFA) 108 (90 Base) MCG/ACT inhaler Inhale 2 puffs into the lungs every 6 (six) hours as needed for wheezing or shortness of breath.  Marland Kitchen albuterol (PROVENTIL HFA;VENTOLIN HFA) 108 (90 Base) MCG/ACT inhaler Inhale 1-2 puffs into the lungs every 4 (four) hours as needed for wheezing or shortness of breath.  . Multiple Vitamins-Minerals (EMERGEN-C VITAMIN C PO) Take 1,000 mg by mouth daily.  . [DISCONTINUED] oseltamivir (TAMIFLU) 75 MG capsule Take 1 capsule (75 mg total) by mouth every 12 (twelve) hours.  . cyclobenzaprine (FLEXERIL) 10 MG tablet Take 1 tablet (10 mg total) by mouth 3 (three) times daily as needed for muscle spasms.  Marland Kitchen omeprazole (PRILOSEC) 20 MG capsule Take 1 capsule (20 mg total) by mouth daily.  . traZODone (DESYREL) 50 MG tablet Take 0.5-1 tablets (25-50 mg total) by mouth at bedtime as needed for sleep.  . [DISCONTINUED] predniSONE (DELTASONE) 50 MG tablet Take 1 tablet (50 mg total) by mouth daily.  . [DISCONTINUED] promethazine-codeine (PHENERGAN WITH CODEINE) 6.25-10 MG/5ML syrup Take 5 mLs by mouth every 4 (four) hours as needed for  cough.   No facility-administered encounter medications on file as of 03/18/2020.  1. Chronic midline low back pain without sciatica Gave patient education on back stretching exercises, trial Flexeril, increase water intake, refer to neurology.  Patient had x-ray of back in 2018, will order updated x-ray for comparison. - cyclobenzaprine (FLEXERIL) 10 MG tablet; Take 1 tablet (10 mg total) by mouth 3 (three) times daily as needed for muscle spasms.  Dispense: 30 tablet; Refill: 0 - Ambulatory referral to Neurology - DG Lumbar Spine Complete;  Future  2. Insomnia due to medical condition Patient has previously failed melatonin and Benadryl, trial trazodone, encouraged patient to try 25 mg.  Gave patient education on good sleep hygiene - traZODone (DESYREL) 50 MG tablet; Take 0.5-1 tablets (25-50 mg total) by mouth at bedtime as needed for sleep.  Dispense: 30 tablet; Refill: 3  3. Nodule of neck Patient had CT of nodule completed in November 2019, ultrasound was recommended.  IMPRESSION: 1. Nonspecific focal superficial fat stranding on the right at the base of the skull. Given nearby superficial veins, this could reflect superficial thrombophlebitis. A discrete soft tissue mass is not identified. Consider ultrasound for further evaluation. 2.  No acute intracranial abnormality.   Electronically Signed   By: Titus Dubin M.D.   On: 10/21/2018 13:05 - CBC with Differential/Platelet - US Soft Tissue Head/Neck (NON-THYROID); Future  4. Generalized headaches Headaches could be related to lower back pain, insomnia, dehydration.  Encouraged patient to increase water intake, work on improving diet, exercise to help with stress relief.  Encouraged vitamin D 2000 units over-the-counter - CBC with Differential/Platelet - Comp. Metabolic Panel (12) - TSH - Ambulatory referral to Neurology   5. Morbid obesity ([place]) Gave patient education on Mediterranean style diet - Comp. Metabolic Panel (12)  6. Edema, unspecified type Encourage patient to check blood pressure at home, follow low-sodium diet, increase water intake - Comp. Metabolic Panel (12) - TSH  7.  Dysphagia, unspecified type Patient is able to swallow food and water and pills, has not had any episodes of food becoming stuck, trial of Prilosec over-the-counter, red flags given for further evaluation by gastroenterology - omeprazole (PRILOSEC) 20 MG capsule; Take 1 capsule (20 mg total) by mouth daily.  Dispense: 30 capsule; Refill: 3  Patient is going to make  follow-up appointment with her primary care provider for further evaluation of testing results   I have reviewed the patient's medical history (PMH, PSH, Social History, Family History, Medications, and allergies) , and have been updated if relevant. I spent 40 minutes reviewing chart and  face to face time with patient.    Follow-up: Return if symptoms worsen or fail to improve.   Loraine Grip Mayers, PA-C

## 2020-03-20 ENCOUNTER — Telehealth: Payer: Self-pay | Admitting: *Deleted

## 2020-03-20 LAB — CBC WITH DIFFERENTIAL/PLATELET
Basophils Absolute: 0.1 10*3/uL (ref 0.0–0.2)
Basos: 1 %
EOS (ABSOLUTE): 0.1 10*3/uL (ref 0.0–0.4)
Eos: 2 %
Hematocrit: 41.2 % (ref 34.0–46.6)
Hemoglobin: 13.1 g/dL (ref 11.1–15.9)
Immature Grans (Abs): 0 10*3/uL (ref 0.0–0.1)
Immature Granulocytes: 1 %
Lymphocytes Absolute: 1.8 10*3/uL (ref 0.7–3.1)
Lymphs: 32 %
MCH: 29.9 pg (ref 26.6–33.0)
MCHC: 31.8 g/dL (ref 31.5–35.7)
MCV: 94 fL (ref 79–97)
Monocytes Absolute: 0.2 10*3/uL (ref 0.1–0.9)
Monocytes: 4 %
Neutrophils Absolute: 3.4 10*3/uL (ref 1.4–7.0)
Neutrophils: 60 %
Platelets: 296 10*3/uL (ref 150–450)
RBC: 4.38 x10E6/uL (ref 3.77–5.28)
RDW: 13.2 % (ref 11.7–15.4)
WBC: 5.6 10*3/uL (ref 3.4–10.8)

## 2020-03-20 LAB — COMP. METABOLIC PANEL (12)
AST: 17 IU/L (ref 0–40)
Albumin/Globulin Ratio: 1.2 (ref 1.2–2.2)
Albumin: 4 g/dL (ref 3.8–4.8)
Alkaline Phosphatase: 105 IU/L (ref 39–117)
BUN/Creatinine Ratio: 16 (ref 9–23)
BUN: 12 mg/dL (ref 6–24)
Bilirubin Total: 0.3 mg/dL (ref 0.0–1.2)
Calcium: 9.9 mg/dL (ref 8.7–10.2)
Chloride: 103 mmol/L (ref 96–106)
Creatinine, Ser: 0.75 mg/dL (ref 0.57–1.00)
GFR calc Af Amer: 111 mL/min/{1.73_m2} (ref >59)
GFR calc non Af Amer: 97 mL/min/{1.73_m2} (ref >59)
Globulin, Total: 3.3 g/dL (ref 1.5–4.5)
Glucose: 93 mg/dL (ref 65–99)
Potassium: 4.3 mmol/L (ref 3.5–5.2)
Sodium: 140 mmol/L (ref 134–144)
Total Protein: 7.3 g/dL (ref 6.0–8.5)

## 2020-03-20 LAB — TSH: TSH: 0.257 u[IU]/mL — ABNORMAL LOW (ref 0.450–4.500)

## 2020-03-20 NOTE — Telephone Encounter (Signed)
Medical Assistant left message on patient's home and cell voicemail. Voicemail states to give a call back to [place] with [facility] at [phone]

## 2020-03-20 NOTE — Telephone Encounter (Signed)
-----   Message from Kennieth Rad, [place] sent at 03/20/2020  8:53 AM EDT ----- Please call patient and let her know that her thyroid level is abnormal, this can indicate hyperthyroidism, she should follow-up with her primary care provider as soon as possible, have this retested.  If she is unable to get an appointment with her primary care provider in the near future, please let her know that she is more than welcome to be seen on the mobile unit again and we will be happy to follow-up.  Kidney and liver function normal, negative for anemia. Thank you

## 2020-08-28 ENCOUNTER — Encounter: Payer: Self-pay | Admitting: *Deleted

## 2020-08-28 ENCOUNTER — Emergency Department (INDEPENDENT_AMBULATORY_CARE_PROVIDER_SITE_OTHER)
Admission: EM | Admit: 2020-08-28 | Discharge: 2020-08-28 | Disposition: A | Discharge status: Home / Self Care | Payer: BC Managed Care – PPO | Source: Home / Self Care

## 2020-08-28 ENCOUNTER — Other Ambulatory Visit: Payer: Self-pay

## 2020-08-28 DIAGNOSIS — I10 Essential (primary) hypertension: Secondary | ICD-10-CM

## 2020-08-28 DIAGNOSIS — R600 Localized edema: Secondary | ICD-10-CM

## 2020-08-28 DIAGNOSIS — R7989 Other specified abnormal findings of blood chemistry: Secondary | ICD-10-CM | POA: Diagnosis not present

## 2020-08-28 MED ORDER — CYCLOBENZAPRINE HCL 10 MG PO TABS: 10.0000 mg | ORAL_TABLET | Freq: Two times a day (BID) | ORAL | 0 refills | Status: DC | PRN

## 2020-08-28 MED ORDER — FUROSEMIDE 20 MG PO TABS: 20.0000 mg | ORAL_TABLET | Freq: Every day | ORAL | 1 refills | Status: DC | PRN

## 2020-08-28 MED ORDER — PROPRANOLOL HCL ER 60 MG PO CP24: 60.0000 mg | ORAL_CAPSULE | Freq: Every day | ORAL | 0 refills | Status: DC

## 2020-08-28 MED ORDER — KETOROLAC TROMETHAMINE 30 MG/ML IJ SOLN
30.0000 mg | Freq: Once | INTRAMUSCULAR | Status: AC
  Administered 2020-08-28: 30 mg via INTRAMUSCULAR

## 2020-08-28 NOTE — ED Triage Notes (Addendum)
Patient c/o HA, some severe, and dizziness x 2 years. Patient has been out of HCTZ x 2 months. Reports her thinking is slower and expressive aphasia. She also report a lump to the lower right side of her neck that varies in size.

## 2020-08-28 NOTE — ED Provider Notes (Signed)
[facility]    CSN: [id number] Arrival date & time: 08/28/20  1036      History   Chief Complaint Chief Complaint  Patient presents with  . Headache  . Dizziness  . Neck Pain  . Mass    lump on right side neck    HPI CARMELLA KEES is a 44 y.o. female.   HPI Patient presents with multiple concerns today. Acute concerns is headache and dizziness over the last several days. She has high blood pressure and reports that her blood pressure has been poorly controlled as she has not routinely follow-up with health care as she dislikes going to medical offices. She became concern as her HA has remained constant over the last few days and she has experienced associated dizziness. In review of EMR, patient previously seen on [facility] clinic and TSH was low. Patient reported she intended to follow-up with clinic however was unaware of how to locate their services. She reports having very pleasant experience with the staff on the mobile clinic and would like information to follow-up with their clinic. She also endorses neck pain and lump on right side of neck. Per notes from her mobile clinic visit, orders had been placed for an ultrasound, although the study had not been scheduled. She's endorses generalized neck pain and associates the pain with her current headache. She has taken multiple OTC medication for HA and neck pain without relief. Denies any unilateral weakness, vision changes, endorses dyspnea with activity and negative for CP.      Past Medical History:  Diagnosis Date  . Asthma    diagnosed as  a young adult   . IBS (irritable bowel syndrome)   . Rheumatoid arthritis(714.0)     Patient Active Problem List   Diagnosis Date Noted  . Systemic viral illness 11/13/2012  . Morbid obesity with BMI of 40.0-44.9, adult ([place]) 11/13/2012  . Tachycardia 11/13/2012  . Rheumatoid arthritis ([place])   . Asthma     Past Surgical History:  Procedure Laterality  Date  . ABDOMINAL HYSTERECTOMY    . APPENDECTOMY    . BREAST REDUCTION SURGERY    . CHOLECYSTECTOMY    . CYSTECTOMY    . HERNIA REPAIR    . TUBAL LIGATION      OB History   No obstetric history on file.      Home Medications    Prior to Admission medications   Medication Sig Start Date End Date Taking? Authorizing Provider  albuterol (PROVENTIL HFA;VENTOLIN HFA) 108 (90 Base) MCG/ACT inhaler Inhale 2 puffs into the lungs every 6 (six) hours as needed for wheezing or shortness of breath. 11/01/18   Nafziger, Tommi Rumps, NP  albuterol (PROVENTIL HFA;VENTOLIN HFA) 108 (90 Base) MCG/ACT inhaler Inhale 1-2 puffs into the lungs every 4 (four) hours as needed for wheezing or shortness of breath. 12/19/18   Wynona Luna, MD  cyclobenzaprine (FLEXERIL) 10 MG tablet Take 1 tablet (10 mg total) by mouth 3 (three) times daily as needed for muscle spasms. 03/18/20   Mayers, Cari S, PA-C  Multiple Vitamins-Minerals (EMERGEN-C VITAMIN C PO) Take 1,000 mg by mouth daily.    [provider]    Family History Family History  Problem Relation Age of Onset  . Rheum arthritis Father   . Breast cancer Father        died  . Alcohol abuse Father   . Lupus Maternal Aunt   . Rheum arthritis Mother   .  Rheum arthritis Maternal Grandmother   . Cancer Maternal Grandmother   . Rheum arthritis Maternal Grandfather   . Rheum arthritis Paternal Grandfather   . Rheum arthritis Paternal Grandmother     Social History Social History   Tobacco Use  . Smoking status: Never Smoker  . Smokeless tobacco: Never Used  Vaping Use  . Vaping Use: Never used  Substance Use Topics  . Alcohol use: No  . Drug use: No     Allergies   Bee venom, Doxycycline, Diclofenac, Toradol [ketorolac tromethamine], and Metoclopramide   Review of Systems Review of Systems Pertinent negatives listed in HPI Physical Exam Triage Vital Signs ED Triage Vitals  Enc Vitals Group     BP 08/28/20 1210 (!) 140/93      Pulse Rate 08/28/20 1210 73     Resp 08/28/20 1210 18     Temp 08/28/20 1210 98.6 F (37 C)     Temp Source 08/28/20 1210 Oral     SpO2 08/28/20 1210 97 %     Weight --      Height --      Head Circumference --      Peak Flow --      Pain Score 08/28/20 1221 8     Pain Loc --      Pain Edu? --      Excl. in [place]? --    No data found.  Updated Vital Signs BP (!) 140/93 (BP Location: Right Arm)   Pulse 73   Temp 98.6 F (37 C) (Oral)   Resp 18   SpO2 97%   Visual Acuity Right Eye Distance:   Left Eye Distance:   Bilateral Distance:   Right Eye Near:   Left Eye Near:    Bilateral Near:     Physical Exam Constitutional:      Appearance: She is obese. She is ill-appearing.  HENT:     Head: Normocephalic and atraumatic.  Eyes:     Extraocular Movements: Extraocular movements intact.     Pupils: Pupils are equal, round, and reactive to light.  Neck:     Comments: Neck fullness present  Cardiovascular:     Rate and Rhythm: Normal rate and regular rhythm.     Comments: Edema non-pitting  Pulmonary:     Effort: Pulmonary effort is normal.     Breath sounds: Normal breath sounds. No rhonchi.  Musculoskeletal:        General: Normal range of motion.     Cervical back: Normal range of motion.     Right lower leg: 2+ Edema present.     Left lower leg: 2+ Edema present.  Skin:    General: Skin is warm and dry.  Neurological:     Mental Status: She is alert.     GCS: GCS eye subscore is 4. GCS verbal subscore is 5. GCS motor subscore is 6.     Motor: No weakness.     Coordination: Coordination normal.     Gait: Gait normal.  Psychiatric:        Mood and Affect: Mood normal.        Behavior: Behavior normal.      UC Treatments / Results  Labs (all labs ordered are listed, but only abnormal results are displayed) Labs Reviewed  TSH  BASIC METABOLIC PANEL    EKG   Radiology No results found.  Procedures Procedures (including critical care  time)  Medications Ordered in UC Medications  ketorolac (TORADOL)  30 MG/ML injection 30 mg (30 mg Intramuscular Given 08/28/20 1421)    Initial Impression / Assessment and Plan / UC Course  I have reviewed the triage vital signs and the nursing notes.  Pertinent labs & imaging results that were available during my care of the patient were reviewed by me and considered in my medical decision making (see chart for details).     Checking BMP confirm potassium and renal function stable to tolerate Lasix. Repeating TSH to ensure elevation overactivity is not contributing to elevated blood pressure. To improve control of BP start propranolol XR and Lasix PRN for BLE edema. Cyclobenzaprine prescribed for HA and neck pain.Schedule for [facility] clinic provided to patient to follow-up for BP check. Strict ER precaution given if symptoms worsen or do not improve. Final Clinical Impressions(s) / UC Diagnoses   Final diagnoses:  Essential hypertension  Bilateral edema of lower extremity  Low TSH level     Discharge Instructions     Follow-up with [facility] for BP follow-up.     ED Prescriptions    Medication Sig Dispense Auth. Provider   propranolol ER (INDERAL LA) 60 MG 24 hr capsule Take 1 capsule (60 mg total) by mouth at bedtime. 90 capsule Scot Jun, FNP   furosemide (LASIX) 20 MG tablet Take 1 tablet (20 mg total) by mouth daily as needed for edema. 30 tablet Scot Jun, FNP   cyclobenzaprine (FLEXERIL) 10 MG tablet Take 1 tablet (10 mg total) by mouth 2 (two) times daily as needed for muscle spasms. 90 tablet Scot Jun, FNP     PDMP not reviewed this encounter.   Scot Jun, FNP 09/03/20 2216

## 2020-08-28 NOTE — Discharge Instructions (Addendum)
Follow-up with [facility] for BP follow-up.

## 2020-08-29 LAB — BASIC METABOLIC PANEL
BUN: 8 mg/dL (ref 7–25)
CO2: 28 mmol/L (ref 20–32)
Calcium: 9.4 mg/dL (ref 8.6–10.2)
Chloride: 103 mmol/L (ref 98–110)
Creat: 0.68 mg/dL (ref 0.50–1.10)
Glucose, Bld: 91 mg/dL (ref 65–99)
Potassium: 3.9 mmol/L (ref 3.5–5.3)
Sodium: 139 mmol/L (ref 135–146)

## 2020-08-29 LAB — TSH: TSH: 0.5 mIU/L

## 2020-09-01 ENCOUNTER — Telehealth: Payer: Self-pay | Admitting: Emergency Medicine

## 2020-09-01 NOTE — Telephone Encounter (Signed)
Return call to [facility] regarding her BMP & TSH levels- all within normal range. TSH was at low end (0.5) RN recommended pt follow up w/ her PCP. Pt plans to follow up with a mobile doctor unit. Lab results left at front desk

## 2020-12-07 ENCOUNTER — Emergency Department (HOSPITAL_COMMUNITY): Payer: BC Managed Care – PPO

## 2020-12-07 ENCOUNTER — Encounter (HOSPITAL_COMMUNITY): Payer: Self-pay

## 2020-12-07 ENCOUNTER — Other Ambulatory Visit: Payer: Self-pay

## 2020-12-07 ENCOUNTER — Emergency Department (HOSPITAL_COMMUNITY)
Admission: EM | Admit: 2020-12-07 | Discharge: 2020-12-07 | Disposition: A | Discharge status: Home / Self Care | Payer: BC Managed Care – PPO | Attending: Emergency Medicine | Admitting: Emergency Medicine

## 2020-12-07 DIAGNOSIS — J45909 Unspecified asthma, uncomplicated: Secondary | ICD-10-CM | POA: Diagnosis not present

## 2020-12-07 DIAGNOSIS — R059 Cough, unspecified: Secondary | ICD-10-CM | POA: Diagnosis present

## 2020-12-07 DIAGNOSIS — M546 Pain in thoracic spine: Secondary | ICD-10-CM | POA: Insufficient documentation

## 2020-12-07 DIAGNOSIS — R350 Frequency of micturition: Secondary | ICD-10-CM | POA: Diagnosis not present

## 2020-12-07 DIAGNOSIS — U071 COVID-19: Secondary | ICD-10-CM

## 2020-12-07 LAB — COMPREHENSIVE METABOLIC PANEL
ALT: 16 U/L (ref 0–44)
AST: 18 U/L (ref 15–41)
Albumin: 3.6 g/dL (ref 3.5–5.0)
Alkaline Phosphatase: 82 U/L (ref 38–126)
Anion gap: 10 (ref 5–15)
BUN: 5 mg/dL — ABNORMAL LOW (ref 6–20)
CO2: 25 mmol/L (ref 22–32)
Calcium: 9.3 mg/dL (ref 8.9–10.3)
Chloride: 100 mmol/L (ref 98–111)
Creatinine, Ser: 0.85 mg/dL (ref 0.44–1.00)
GFR, Estimated: >60 mL/min (ref >60)
Glucose, Bld: 106 mg/dL — ABNORMAL HIGH (ref 70–99)
Potassium: 3.9 mmol/L (ref 3.5–5.1)
Sodium: 135 mmol/L (ref 135–145)
Total Bilirubin: 0.7 mg/dL (ref 0.3–1.2)
Total Protein: 7.7 g/dL (ref 6.5–8.1)

## 2020-12-07 LAB — URINALYSIS, ROUTINE W REFLEX MICROSCOPIC
Bilirubin Urine: NEGATIVE
Glucose, UA: NEGATIVE mg/dL
Hgb urine dipstick: NEGATIVE
Ketones, ur: NEGATIVE mg/dL
Leukocytes,Ua: NEGATIVE
Nitrite: NEGATIVE
Protein, ur: NEGATIVE mg/dL
Specific Gravity, Urine: 1.018 (ref 1.005–1.030)
pH: 5 (ref 5.0–8.0)

## 2020-12-07 LAB — LIPASE, BLOOD: Lipase: 20 U/L (ref 11–51)

## 2020-12-07 LAB — CBC
HCT: 39.5 % (ref 36.0–46.0)
Hemoglobin: 12.9 g/dL (ref 12.0–15.0)
MCH: 29.7 pg (ref 26.0–34.0)
MCHC: 32.7 g/dL (ref 30.0–36.0)
MCV: 91 fL (ref 80.0–100.0)
Platelets: 244 10*3/uL (ref 150–400)
RBC: 4.34 MIL/uL (ref 3.87–5.11)
RDW: 13.3 % (ref 11.5–15.5)
WBC: 2.5 10*3/uL — ABNORMAL LOW (ref 4.0–10.5)
nRBC: 0 % (ref 0.0–0.2)

## 2020-12-07 LAB — RESP PANEL BY RT-PCR (FLU A&B, COVID) ARPGX2
Influenza A by PCR: NEGATIVE
Influenza B by PCR: NEGATIVE
SARS Coronavirus 2 by RT PCR: POSITIVE — AB

## 2020-12-07 MED ORDER — ONDANSETRON 4 MG PO TBDP: 4.0000 mg | ORAL_TABLET | Freq: Three times a day (TID) | ORAL | 0 refills | Status: DC | PRN

## 2020-12-07 MED ORDER — ONDANSETRON 4 MG PO TBDP
4.0000 mg | ORAL_TABLET | Freq: Once | ORAL | Status: AC | PRN
  Administered 2020-12-07: 4 mg via ORAL
  Filled 2020-12-07: qty 1

## 2020-12-07 MED ORDER — PREDNISONE 10 MG (21) PO TBPK: ORAL_TABLET | ORAL | 0 refills | Status: DC

## 2020-12-07 MED ORDER — IOHEXOL 350 MG/ML SOLN
100.0000 mL | Freq: Once | INTRAVENOUS | Status: AC | PRN
  Administered 2020-12-07: 100 mL via INTRAVENOUS

## 2020-12-07 MED ORDER — ACETAMINOPHEN 325 MG PO TABS
650.0000 mg | ORAL_TABLET | Freq: Once | ORAL | Status: AC | PRN
  Administered 2020-12-07: 650 mg via ORAL
  Filled 2020-12-07: qty 2

## 2020-12-07 MED ORDER — KETOROLAC TROMETHAMINE 30 MG/ML IJ SOLN
15.0000 mg | Freq: Once | INTRAMUSCULAR | Status: AC
  Administered 2020-12-07: 15 mg via INTRAVENOUS
  Filled 2020-12-07: qty 1

## 2020-12-07 MED ORDER — LACTATED RINGERS IV BOLUS
1000.0000 mL | Freq: Once | INTRAVENOUS | Status: AC
  Administered 2020-12-07: 1000 mL via INTRAVENOUS

## 2020-12-07 NOTE — ED Triage Notes (Signed)
Pt reports left lower back pain since yesterday, pt unable to keep anything down due to the pain. Pt also reports frequent urination. Febrile in triage.

## 2020-12-07 NOTE — ED Provider Notes (Signed)
[facility] Provider Note   CSN: [id number] Arrival date & time: 12/07/20  1650     History Chief Complaint  Patient presents with  . Back Pain  . Nausea    [name] is a 44 y.o. female.  HPI      [name] is a 44 y.o. female, with a history of asthma, presenting to the ED with left mid back pain beginning yesterday. Pain is sharp, severe, nonradiating.  "It might be worse with deep breathing." She thinks she may have started coughing a little bit yesterday.  She also endorses urinary frequency.  Denies known fever, abdominal pain, chest pain, shortness of breath, syncope, numbness, weakness, dysuria, hematuria, or any other complaints.   Past Medical History:  Diagnosis Date  . Asthma    diagnosed as  a young adult   . IBS (irritable bowel syndrome)   . Rheumatoid arthritis(714.0)     Patient Active Problem List   Diagnosis Date Noted  . Systemic viral illness 11/13/2012  . Morbid obesity with BMI of 40.0-44.9, adult ([place]) 11/13/2012  . Tachycardia 11/13/2012  . Rheumatoid arthritis ([place])   . Asthma     Past Surgical History:  Procedure Laterality Date  . ABDOMINAL HYSTERECTOMY    . APPENDECTOMY    . BREAST REDUCTION SURGERY    . CHOLECYSTECTOMY    . CYSTECTOMY    . HERNIA REPAIR    . TUBAL LIGATION       OB History   No obstetric history on file.     Family History  Problem Relation Age of Onset  . Rheum arthritis Father   . Breast cancer Father        died  . Alcohol abuse Father   . Lupus Maternal Aunt   . Rheum arthritis Mother   . Rheum arthritis Maternal Grandmother   . Cancer Maternal Grandmother   . Rheum arthritis Maternal Grandfather   . Rheum arthritis Paternal Grandfather   . Rheum arthritis Paternal Grandmother     Social History   Tobacco Use  . Smoking status: Never Smoker  . Smokeless tobacco: Never Used  Vaping Use  . Vaping Use: Never used  Substance Use  Topics  . Alcohol use: No  . Drug use: No    Home Medications Prior to Admission medications   Medication Sig Start Date End Date Taking? Authorizing Provider  albuterol (PROVENTIL HFA;VENTOLIN HFA) 108 (90 Base) MCG/ACT inhaler Inhale 1-2 puffs into the lungs every 4 (four) hours as needed for wheezing or shortness of breath. 12/19/18  Yes Wynona Luna, MD  cyclobenzaprine (FLEXERIL) 10 MG tablet Take 1 tablet (10 mg total) by mouth 2 (two) times daily as needed for muscle spasms. 08/28/20  Yes Scot Jun, FNP  furosemide (LASIX) 20 MG tablet Take 1 tablet (20 mg total) by mouth daily as needed for edema. 08/28/20  Yes Scot Jun, FNP  Multiple Vitamins-Minerals (EMERGEN-C VITAMIN C PO) Take 1,000 mg by mouth daily.   Yes [provider]  propranolol ER (INDERAL LA) 60 MG 24 hr capsule Take 1 capsule (60 mg total) by mouth at bedtime. 08/28/20  Yes Scot Jun, FNP  ondansetron (ZOFRAN ODT) 4 MG disintegrating tablet Take 1 tablet (4 mg total) by mouth every 8 (eight) hours as needed for nausea or vomiting. 12/07/20   Cecia Egge C, PA-C  predniSONE (STERAPRED UNI-PAK 21 TAB) 10 MG (21) TBPK tablet Take 6 tabs (  60mg ) day 1, 5 tabs (50mg ) day 2, 4 tabs (40mg ) day 3, 3 tabs (30mg ) day 4, 2 tabs (20mg ) day 5, and 1 tab (10mg ) day 6. 12/07/20   Alailah Safley C, PA-C    Allergies    Bee venom, Doxycycline, Diclofenac, Metoclopramide, and Tramadol  Review of Systems   Review of Systems  Constitutional: Positive for fatigue. Negative for chills and fever.  Respiratory: Positive for cough. Negative for shortness of breath.   Cardiovascular: Negative for chest pain and leg swelling.  Gastrointestinal: Positive for diarrhea. Negative for abdominal pain, nausea and vomiting.  Genitourinary: Positive for frequency. Negative for difficulty urinating, dysuria, flank pain and hematuria.  Musculoskeletal: Positive for back pain.  Neurological: Negative for dizziness,  syncope and weakness.  All other systems reviewed and are negative.   Physical Exam Updated Vital Signs BP 140/88   Pulse 97   Temp (!) 102.1 F (38.9 C) (Oral)   Resp 16   SpO2 98%   Physical Exam Vitals and nursing note reviewed.  Constitutional:      General: She is not in acute distress.    Appearance: She is well-developed. She is obese. She is not diaphoretic.  HENT:     Head: Normocephalic and atraumatic.     Mouth/Throat:     Mouth: Mucous membranes are moist.     Pharynx: Oropharynx is clear.  Eyes:     Conjunctiva/sclera: Conjunctivae normal.  Cardiovascular:     Rate and Rhythm: Normal rate and regular rhythm.     Pulses: Normal pulses.          Radial pulses are 2+ on the right side and 2+ on the left side.       Posterior tibial pulses are 2+ on the right side and 2+ on the left side.     Heart sounds: Normal heart sounds.     Comments: Tactile temperature in the extremities appropriate and equal bilaterally. Pulmonary:     Effort: Pulmonary effort is normal. No respiratory distress.     Breath sounds: Normal breath sounds.  Abdominal:     Palpations: Abdomen is soft.     Tenderness: There is no abdominal tenderness. There is no right CVA tenderness, left CVA tenderness or guarding.  Musculoskeletal:     Cervical back: Neck supple.     Right lower leg: No edema.     Left lower leg: No edema.     Comments: Unable to recreate the patient's pain with palpation.  Lymphadenopathy:     Cervical: No cervical adenopathy.  Skin:    General: Skin is warm and dry.  Neurological:     Mental Status: She is alert.  Psychiatric:        Mood and Affect: Mood and affect normal.        Speech: Speech normal.        Behavior: Behavior normal.     ED Results / Procedures / Treatments   Labs (all labs ordered are listed, but only abnormal results are displayed) Labs Reviewed  RESP PANEL BY RT-PCR (FLU A&B, COVID) ARPGX2 - Abnormal; Notable for the following  components:      Result Value   SARS Coronavirus 2 by RT PCR POSITIVE (*)    All other components within normal limits  COMPREHENSIVE METABOLIC PANEL - Abnormal; Notable for the following components:   Glucose, Bld 106 (*)    BUN 5 (*)    All other components within normal limits  CBC -  Abnormal; Notable for the following components:   WBC 2.5 (*)    All other components within normal limits  URINALYSIS, ROUTINE W REFLEX MICROSCOPIC - Abnormal; Notable for the following components:   APPearance HAZY (*)    All other components within normal limits  URINE CULTURE  LIPASE, BLOOD    EKG None  Radiology CT Angio Chest PE W and/or Wo Contrast  Result Date: 12/07/2020 CLINICAL DATA:  44 year old female with sudden onset back pain, abdominal pain, fever, frequent urination. EXAM: CT ANGIOGRAPHY CHEST CT ABDOMEN AND PELVIS WITH CONTRAST TECHNIQUE: Multidetector CT imaging of the chest was performed using the standard protocol during bolus administration of intravenous contrast. Multiplanar CT image reconstructions and MIPs were obtained to evaluate the vascular anatomy. Multidetector CT imaging of the abdomen and pelvis was performed using the standard protocol during bolus administration of intravenous contrast. CONTRAST:  11mL OMNIPAQUE IOHEXOL 350 MG/ML SOLN COMPARISON:  Portable chest radiograph today. [facility] CTA chest 10/28/2013. [facility] CT Abdomen and Pelvis 06/08/2020 and earlier. FINDINGS: CTA CHEST FINDINGS Cardiovascular: Good contrast bolus timing in the pulmonary arterial tree. Respiratory motion, most pronounced in the lower lobes. No focal filling defect identified in the pulmonary arteries to suggest acute pulmonary embolism. Negative thoracic aorta. No cardiomegaly or pericardial effusion. Mediastinum/Nodes: Negative aside from small gastric hiatal hernia. Lungs/Pleura: Major airways are patent. Mildly lower lung volumes  than in 2014. Minor atelectasis, mostly dependent. Otherwise negative. No pleural effusion. Musculoskeletal: Negative. Review of the MIP images confirms the above findings. CT ABDOMEN and PELVIS FINDINGS Hepatobiliary: Chronically absent gallbladder. Negative liver. Pancreas: Negative. Spleen: Negative. Adrenals/Urinary Tract: Normal adrenal glands. Both kidneys appear stable with symmetric renal enhancement and contrast excretion. No nephrolithiasis. Tiny posterior midpole cortical cyst on the right. Decompressed ureters. Chronic left hemipelvis phlebolith. Generalized mild to moderate bladder wall thickening is new (series 3, image 87) but otherwise the bladder is unremarkable. Stomach/Bowel: Redundant but largely decompressed large bowel. No large bowel inflammation. Diminutive or absent appendix. Negative terminal ileum. No dilated small bowel. Small gastric hiatal hernia but decompressed and otherwise negative stomach. Negative duodenum. No free air. No free fluid, mesenteric stranding. Vascular/Lymphatic: Negative abdominal aorta. Other major arterial structures in the abdomen and pelvis appear patent and normal. Portal venous system is patent. No lymphadenopathy. Reproductive: Chronically absent uterus. Diminutive or absent ovaries. Other: No pelvic free fluid. Musculoskeletal: Lower lumbar facet arthropathy greater on the right. No acute osseous abnormality identified. Review of the MIP images confirms the above findings. IMPRESSION: 1. Negative for acute pulmonary embolus. 2. Mild to moderate bladder wall thickening is new since June. Query UTI, Cystitis. 3. No other acute or inflammatory process identified in the chest, abdomen, or pelvis. Small hiatal hernia. Electronically Signed   By: Genevie Ann M.D.   On: 12/07/2020 21:27   CT ABDOMEN PELVIS W CONTRAST  Result Date: 12/07/2020 CLINICAL DATA:  44 year old female with sudden onset back pain, abdominal pain, fever, frequent urination. EXAM: CT  ANGIOGRAPHY CHEST CT ABDOMEN AND PELVIS WITH CONTRAST TECHNIQUE: Multidetector CT imaging of the chest was performed using the standard protocol during bolus administration of intravenous contrast. Multiplanar CT image reconstructions and MIPs were obtained to evaluate the vascular anatomy. Multidetector CT imaging of the abdomen and pelvis was performed using the standard protocol during bolus administration of intravenous contrast. CONTRAST:  152mL OMNIPAQUE IOHEXOL 350 MG/ML SOLN COMPARISON:  Portable chest radiograph today. [facility] CTA chest 10/28/2013. [place]  [facility] CT Abdomen and Pelvis 06/08/2020 and earlier. FINDINGS: CTA CHEST FINDINGS Cardiovascular: Good contrast bolus timing in the pulmonary arterial tree. Respiratory motion, most pronounced in the lower lobes. No focal filling defect identified in the pulmonary arteries to suggest acute pulmonary embolism. Negative thoracic aorta. No cardiomegaly or pericardial effusion. Mediastinum/Nodes: Negative aside from small gastric hiatal hernia. Lungs/Pleura: Major airways are patent. Mildly lower lung volumes than in 2014. Minor atelectasis, mostly dependent. Otherwise negative. No pleural effusion. Musculoskeletal: Negative. Review of the MIP images confirms the above findings. CT ABDOMEN and PELVIS FINDINGS Hepatobiliary: Chronically absent gallbladder. Negative liver. Pancreas: Negative. Spleen: Negative. Adrenals/Urinary Tract: Normal adrenal glands. Both kidneys appear stable with symmetric renal enhancement and contrast excretion. No nephrolithiasis. Tiny posterior midpole cortical cyst on the right. Decompressed ureters. Chronic left hemipelvis phlebolith. Generalized mild to moderate bladder wall thickening is new (series 3, image 87) but otherwise the bladder is unremarkable. Stomach/Bowel: Redundant but largely decompressed large bowel. No large bowel inflammation. Diminutive or absent appendix.  Negative terminal ileum. No dilated small bowel. Small gastric hiatal hernia but decompressed and otherwise negative stomach. Negative duodenum. No free air. No free fluid, mesenteric stranding. Vascular/Lymphatic: Negative abdominal aorta. Other major arterial structures in the abdomen and pelvis appear patent and normal. Portal venous system is patent. No lymphadenopathy. Reproductive: Chronically absent uterus. Diminutive or absent ovaries. Other: No pelvic free fluid. Musculoskeletal: Lower lumbar facet arthropathy greater on the right. No acute osseous abnormality identified. Review of the MIP images confirms the above findings. IMPRESSION: 1. Negative for acute pulmonary embolus. 2. Mild to moderate bladder wall thickening is new since June. Query UTI, Cystitis. 3. No other acute or inflammatory process identified in the chest, abdomen, or pelvis. Small hiatal hernia. Electronically Signed   By: Genevie Ann M.D.   On: 12/07/2020 21:27   DG Chest Portable 1 View  Result Date: 12/07/2020 CLINICAL DATA:  44 year old female with back pain. EXAM: PORTABLE CHEST 1 VIEW COMPARISON:  Chest radiograph dated 06/08/2020. FINDINGS: The heart size and mediastinal contours are within normal limits. Both lungs are clear. The visualized skeletal structures are unremarkable. IMPRESSION: No active disease. Electronically Signed   By: Anner Crete M.D.   On: 12/07/2020 20:21    Procedures Procedures (including critical care time)  Medications Ordered in ED Medications  ondansetron (ZOFRAN-ODT) disintegrating tablet 4 mg (4 mg Oral Given 12/07/20 1712)  acetaminophen (TYLENOL) tablet 650 mg (650 mg Oral Given 12/07/20 1712)  ketorolac (TORADOL) 30 MG/ML injection 15 mg (15 mg Intravenous Given 12/07/20 2033)  lactated ringers bolus 1,000 mL (0 mLs Intravenous Stopped 12/07/20 2254)  iohexol (OMNIPAQUE) 350 MG/ML injection 100 mL (100 mLs Intravenous Contrast Given 12/07/20 2106)    ED Course  I have reviewed  the triage vital signs and the nursing notes.  Pertinent labs & imaging results that were available during my care of the patient were reviewed by me and considered in my medical decision making (see chart for details).    MDM Rules/Calculators/A&P                          Patient presents with sudden onset of left-sided back pain.  Unable to recreate this pain on exam. Patient is nontoxic appearing, not tachycardic, not tachypneic, not hypotensive, maintains excellent SPO2 on room air.  I have reviewed the patient's chart to obtain more information.   I reviewed and interpreted the patient's labs and  radiological studies. Due to the intensity of the patient's pain, it sudden onset, and lack of abnormalities noted on the urine to explain her pain, additional imaging studies were deemed appropriate. Thankfully, CTs without acute, actionable abnormalities.  Covid positive.  The patient was given instructions for home care as well as return precautions. Patient voices understanding of these instructions, accepts the plan, and is comfortable with discharge.  Findings and plan of care discussed with attending physician, Ronnald Ramp, MD.   Barrington Ellison was evaluated in Emergency Department on 12/09/2020 for the symptoms described in the history of present illness. She was evaluated in the context of the global COVID-19 pandemic, which necessitated consideration that the patient might be at risk for infection with the SARS-CoV-2 virus that causes COVID-19. Institutional protocols and algorithms that pertain to the evaluation of patients at risk for COVID-19 are in a state of rapid change based on information released by regulatory bodies including the CDC and federal and state organizations. These policies and algorithms were followed during the patient's care in the ED.  Vitals:   12/07/20 1820 12/07/20 1825 12/07/20 1915 12/07/20 1945  BP: 140/88  (!) 143/86 121/74  Pulse: 97 97 (!) 102  92  Resp: 16   16  Temp:      TempSrc:      SpO2: 98% 98% 99% 98%     Final Clinical Impression(s) / ED Diagnoses Final diagnoses:  COVID-19    Rx / DC Orders ED Discharge Orders         Ordered    ondansetron (ZOFRAN ODT) 4 MG disintegrating tablet  Every 8 hours PRN        12/07/20 2254    predniSONE (STERAPRED UNI-PAK 21 TAB) 10 MG (21) TBPK tablet        12/07/20 2254           Lorayne Bender, PA-C 12/09/20 0051    Hayden Rasmussen, MD 12/09/20 [phone]

## 2020-12-07 NOTE — ED Notes (Signed)
Patient states that she has had L flank pain since yesterday and has been getting progressively worse.  Denies painful urination but endorses frequency

## 2020-12-07 NOTE — ED Notes (Signed)
Shirlee Limerick, RN presented patient with discharge instructions. Patient verbalizes understanding of discharge instructions. Opportunity for questioning and answers were provided. Armband removed by staff, pt discharged from ED ambulatory to home.

## 2020-12-07 NOTE — Discharge Instructions (Addendum)
COVID-19 Home Management:  You have had a positive test for COVID-19. COVID-19 is caused by a virus. Viruses do not require or respond to antibiotics. Treatment is symptomatic care and it is important to note that these symptoms may last for 7-14 days.   Hand washing: Wash your hands throughout the day, but especially before and after touching the face, using the restroom, sneezing, coughing, or touching surfaces that have been coughed or sneezed upon. Hydration: Symptoms of most illnesses will be intensified and complicated by dehydration. Dehydration can also extend the duration of symptoms. Drink plenty of fluids and get plenty of rest. You should be drinking at least half a liter of water an hour to stay hydrated. Electrolyte drinks (ex. Gatorade, Powerade, Pedialyte) are also encouraged. You should be drinking enough fluids to make your urine light yellow, almost clear. If this is not the case, you are not drinking enough water. Please note that some of the treatments indicated below will not be effective if you are not adequately hydrated. Diet: Please concentrate on hydration, however, you may introduce food slowly.  Start with a clear liquid diet, progressed to a full liquid diet, and then bland solids as you are able. Pain or fever: Ibuprofen, Naproxen, or acetaminophen (generic for Tylenol) for pain or fever.  Antiinflammatory medications: Take 600 mg of ibuprofen every 6 hours or 440 mg (over the counter dose) to 500 mg (prescription dose) of naproxen every 12 hours for the next 3 days. After this time, these medications may be used as needed for pain. Take these medications with food to avoid upset stomach. Choose only one of these medications, do not take them together. Acetaminophen (generic for Tylenol): Should you continue to have additional pain while taking the ibuprofen or naproxen, you may add in acetaminophen as needed. Your daily total maximum amount of acetaminophen from all sources  should be limited to 4000mg /day for persons without liver problems, or 2000mg /day for those with liver problems. Nausea/vomiting: Use the ondansetron (generic for Zofran) for nausea or vomiting.  This medication may not prevent all vomiting or nausea, but can help facilitate better hydration. Things that can help with nausea/vomiting also include peppermint/menthol candies, vitamin B12, and ginger. Diarrhea: May use medications such as loperamide (Imodium) or Bismuth subsalicylate (Pepto-Bismol). Cough: Teas, warm liquids, broths, and honey can help with cough. Prednisone: Take the prednisone, as directed, in its entirety. Zyrtec or Claritin: May add these medication daily to control underlying symptoms of congestion, sneezing, and other signs of allergies.  These medications are available over-the-counter. Generics: Cetirizine (generic for Zyrtec) and loratadine (generic for Claritin). Fluticasone: Use fluticasone (generic for Flonase), as directed, for nasal and sinus congestion.  This medication is available over-the-counter. Congestion: Plain guaifenesin (generic for plain Mucinex) may help relieve congestion. Saline sinus rinses and saline nasal sprays may also help relieve congestion.  Sore throat: Warm liquids or Chloraseptic spray may help soothe a sore throat. Gargle twice a day with a salt water solution made from a half teaspoon of salt in a cup of warm water.  Follow up: Follow up with a primary care provider within the next two weeks should symptoms fail to resolve. Return: Return to the ED for significantly worsening symptoms, shortness of breath, persistent/worsening chest pain, persistent vomiting, large amounts of blood in stool, worsening/localized abdominal pain, or any other major concerns.  For prescription assistance, may try using prescription discount sites or apps, such as goodrx.com  COVID-19 isolation recommendations  Patients who have  symptoms consistent with COVID-19  should self isolate until: At least 3 days (72 hours) have passed since recovery, defined as resolution of fever without the use of fever reducing medications and improvement in respiratory symptoms (e.g., cough, shortness of breath), and At least 7 days have passed since symptoms first appeared. Retesting is not required and not recommended as patients can continue to test positive for several weeks despite lack of symptoms.

## 2020-12-08 ENCOUNTER — Telehealth: Payer: Self-pay | Admitting: Unknown Physician Specialty

## 2020-12-08 LAB — URINE CULTURE: Culture: NO GROWTH

## 2020-12-08 NOTE — Telephone Encounter (Signed)
Called to Discuss with patient about Covid symptoms and the use of the monoclonal antibody infusion for those with mild to moderate Covid symptoms and at a high risk of hospitalization.     Pt appears to qualify for this infusion due to co-morbid conditions and/or a member of an at-risk group in accordance with the FDA Emergency Use Authorization.    Unable to reach pt   LMOM 

## 2021-02-06 ENCOUNTER — Emergency Department (HOSPITAL_COMMUNITY)
Admission: EM | Admit: 2021-02-06 | Discharge: 2021-02-06 | Disposition: A | Discharge status: Home / Self Care | Payer: Self-pay | Attending: Emergency Medicine | Admitting: Emergency Medicine

## 2021-02-06 ENCOUNTER — Emergency Department (HOSPITAL_COMMUNITY): Payer: Self-pay

## 2021-02-06 DIAGNOSIS — M62830 Muscle spasm of back: Secondary | ICD-10-CM | POA: Insufficient documentation

## 2021-02-06 DIAGNOSIS — R197 Diarrhea, unspecified: Secondary | ICD-10-CM | POA: Insufficient documentation

## 2021-02-06 DIAGNOSIS — Z20822 Contact with and (suspected) exposure to covid-19: Secondary | ICD-10-CM | POA: Insufficient documentation

## 2021-02-06 DIAGNOSIS — R112 Nausea with vomiting, unspecified: Secondary | ICD-10-CM | POA: Insufficient documentation

## 2021-02-06 DIAGNOSIS — R1084 Generalized abdominal pain: Secondary | ICD-10-CM | POA: Insufficient documentation

## 2021-02-06 DIAGNOSIS — J45909 Unspecified asthma, uncomplicated: Secondary | ICD-10-CM | POA: Insufficient documentation

## 2021-02-06 LAB — COMPREHENSIVE METABOLIC PANEL
ALT: 81 U/L — ABNORMAL HIGH (ref 0–44)
AST: 78 U/L — ABNORMAL HIGH (ref 15–41)
Albumin: 3.2 g/dL — ABNORMAL LOW (ref 3.5–5.0)
Alkaline Phosphatase: 82 U/L (ref 38–126)
Anion gap: 9 (ref 5–15)
BUN: 7 mg/dL (ref 6–20)
CO2: 24 mmol/L (ref 22–32)
Calcium: 8.5 mg/dL — ABNORMAL LOW (ref 8.9–10.3)
Chloride: 106 mmol/L (ref 98–111)
Creatinine, Ser: 0.75 mg/dL (ref 0.44–1.00)
GFR, Estimated: >60 mL/min (ref >60)
Glucose, Bld: 89 mg/dL (ref 70–99)
Potassium: 4.1 mmol/L (ref 3.5–5.1)
Sodium: 139 mmol/L (ref 135–145)
Total Bilirubin: 0.9 mg/dL (ref 0.3–1.2)
Total Protein: 7.1 g/dL (ref 6.5–8.1)

## 2021-02-06 LAB — CBC
HCT: 38.8 % (ref 36.0–46.0)
Hemoglobin: 12.4 g/dL (ref 12.0–15.0)
MCH: 30.2 pg (ref 26.0–34.0)
MCHC: 32 g/dL (ref 30.0–36.0)
MCV: 94.6 fL (ref 80.0–100.0)
Platelets: 278 10*3/uL (ref 150–400)
RBC: 4.1 MIL/uL (ref 3.87–5.11)
RDW: 13.3 % (ref 11.5–15.5)
WBC: 4.1 10*3/uL (ref 4.0–10.5)
nRBC: 0 % (ref 0.0–0.2)

## 2021-02-06 LAB — SARS CORONAVIRUS 2 (TAT 6-24 HRS): SARS Coronavirus 2: NEGATIVE

## 2021-02-06 LAB — URINALYSIS, ROUTINE W REFLEX MICROSCOPIC
Bilirubin Urine: NEGATIVE
Glucose, UA: NEGATIVE mg/dL
Hgb urine dipstick: NEGATIVE
Ketones, ur: NEGATIVE mg/dL
Leukocytes,Ua: NEGATIVE
Nitrite: NEGATIVE
Protein, ur: NEGATIVE mg/dL
Specific Gravity, Urine: 1.025 (ref 1.005–1.030)
pH: 5 (ref 5.0–8.0)

## 2021-02-06 LAB — LIPASE, BLOOD: Lipase: 27 U/L (ref 11–51)

## 2021-02-06 MED ORDER — IOHEXOL 300 MG/ML  SOLN
100.0000 mL | Freq: Once | INTRAMUSCULAR | Status: AC | PRN
  Administered 2021-02-06: 100 mL via INTRAVENOUS

## 2021-02-06 MED ORDER — ACETAMINOPHEN 325 MG PO TABS
650.0000 mg | ORAL_TABLET | Freq: Once | ORAL | Status: AC
  Administered 2021-02-06: 650 mg via ORAL
  Filled 2021-02-06: qty 2

## 2021-02-06 MED ORDER — LIDOCAINE 5 % EX PTCH: 1.0000 | MEDICATED_PATCH | CUTANEOUS | 0 refills | Status: DC

## 2021-02-06 MED ORDER — METHOCARBAMOL 500 MG PO TABS: 500.0000 mg | ORAL_TABLET | Freq: Two times a day (BID) | ORAL | 0 refills | Status: DC

## 2021-02-06 MED ORDER — ONDANSETRON HCL 4 MG/2ML IJ SOLN
4.0000 mg | Freq: Once | INTRAMUSCULAR | Status: AC
  Administered 2021-02-06: 4 mg via INTRAVENOUS
  Filled 2021-02-06: qty 2

## 2021-02-06 MED ORDER — IBUPROFEN 400 MG PO TABS
400.0000 mg | ORAL_TABLET | Freq: Once | ORAL | Status: AC
  Administered 2021-02-06: 400 mg via ORAL
  Filled 2021-02-06: qty 1

## 2021-02-06 MED ORDER — CYCLOBENZAPRINE HCL 10 MG PO TABS
5.0000 mg | ORAL_TABLET | Freq: Once | ORAL | Status: AC
  Administered 2021-02-06: 5 mg via ORAL
  Filled 2021-02-06: qty 1

## 2021-02-06 MED ORDER — DICYCLOMINE HCL 10 MG PO CAPS
10.0000 mg | ORAL_CAPSULE | Freq: Once | ORAL | Status: AC
  Administered 2021-02-06: 10 mg via ORAL
  Filled 2021-02-06: qty 1

## 2021-02-06 MED ORDER — LACTATED RINGERS IV BOLUS
1000.0000 mL | Freq: Once | INTRAVENOUS | Status: AC
  Administered 2021-02-06: 1000 mL via INTRAVENOUS

## 2021-02-06 MED ORDER — SODIUM CHLORIDE 0.9 % IV BOLUS
1000.0000 mL | Freq: Once | INTRAVENOUS | Status: AC
  Administered 2021-02-06: 1000 mL via INTRAVENOUS

## 2021-02-06 NOTE — ED Triage Notes (Signed)
Pt here from home with c/o abd pain along n/v/d after eating some fish sticks last Saturday , pt has had

## 2021-02-06 NOTE — ED Provider Notes (Signed)
  Physical Exam  BP 109/80   Pulse 75   Temp 98.3 F (36.8 C) (Oral)   Resp 18   SpO2 99%   Physical Exam Vitals and nursing note reviewed.  Constitutional:      General: She is awake. She is not in acute distress.    Appearance: She is well-developed, well-groomed and well-nourished. She is obese. She is not ill-appearing.  HENT:     Head: Normocephalic and atraumatic.     Right Ear: External ear normal.     Left Ear: External ear normal.     Nose: Nose normal.     Mouth/Throat:     Mouth: Mucous membranes are moist.     Pharynx: Oropharynx is clear. No oropharyngeal exudate or posterior oropharyngeal erythema.  Eyes:     General: No scleral icterus.       Right eye: No discharge.        Left eye: No discharge.     Conjunctiva/sclera: Conjunctivae normal.  Cardiovascular:     Rate and Rhythm: Normal rate and regular rhythm.  Pulmonary:     Effort: Pulmonary effort is normal. No respiratory distress.  Abdominal:     General: Abdomen is flat. There is no distension.     Palpations: Abdomen is soft.     Tenderness: There is no abdominal tenderness. There is no guarding or rebound.  Musculoskeletal:        General: No edema.     Cervical back: Neck supple.     Right lower leg: No edema.     Left lower leg: No edema.  Skin:    General: Skin is warm and dry.     Findings: No rash.  Neurological:     General: No focal deficit present.     Mental Status: She is alert and oriented to person, place, and time.     Motor: No weakness.  Psychiatric:        Mood and Affect: Mood and affect and mood normal.        Behavior: Behavior normal. Behavior is cooperative.     ED Course/Procedures     Procedures  MDM  Assumed care from [facility] PA-C at 1500. Please refer to his note for full H&P and initial MDM. Briefly, patient presents with generalized abdominal pain and N/V/D for the last 6 days. Initial diagnostic workup overall reassuring with exception of mild AST and  ALT elevation. CT abd/pelvis unremarkable. Additional workup includes COVID swab. Given overall reassuring diagnostic workup, low suspicion for acute emergent pathology at this time. Presentation and workup not consistent with SBO, diverticulitis, pancreatitis, cholecystitis, ovarian torsion, nephrolithiasis, mesenteric ischemia, UTI, pyelonephritis, or perforated bowel.  On reassessment, patient complaining of back pain and given Tylenol, ibuprofen, and Flexeril. Repeat abdominal exam benign and tolerating PO intake. Suspect presentation likely 2/2 viral gastroenteritis vs COVID. Will prescribe Robaxin and lidocaine patch for home. Recommend close follow up with PCP in the next 2-3 days if symptoms are not improving. Strict return precautions provided and discussed. Questions and concerns addressed. Patient verbalized understanding and amenable with discharge plan. Patient discharged in stable condition.       Christy Gentles, MD 02/06/21 Jeananne Rama    Lucrezia Starch, MD 02/07/21 2156

## 2021-02-06 NOTE — ED Notes (Signed)
Patient transported to CT 

## 2021-02-06 NOTE — ED Provider Notes (Signed)
[facility] EMERGENCY DEPARTMENT Provider Note   CSN: [id number] Arrival date & time: 02/06/21  1130     History No chief complaint on file.   [name] is a 45 y.o. female.  Patient with history of abdominal hysterectomy, cholecystectomy, appendectomy --presents the emergency department today for evaluation of nausea, vomiting, diarrhea, and generalized abdominal pain.  Today is the patient 6-day of symptoms.  She states that the symptoms started after eating fish sticks.  She denies other travel or suspicious food or water exposures.  She states that she has had symptoms daily including vomiting, and has diarrhea after eating and drinking.  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  She was told that she was probably dehydrated. No hematuria or irritative UTI symptoms including dysuria, increased frequency or urgency.  No chest pain or shortness of breath.  She has taken over-the-counter diarrhea medication and Zofran at home.         Past Medical History:  Diagnosis Date  . Asthma    diagnosed as  a young adult   . IBS (irritable bowel syndrome)   . Rheumatoid arthritis(714.0)     Patient Active Problem List   Diagnosis Date Noted  . Systemic viral illness 11/13/2012  . Morbid obesity with BMI of 40.0-44.9, adult ([place]) 11/13/2012  . Tachycardia 11/13/2012  . Rheumatoid arthritis ([place])   . Asthma     Past Surgical History:  Procedure Laterality Date  . ABDOMINAL HYSTERECTOMY    . APPENDECTOMY    . BREAST REDUCTION SURGERY    . CHOLECYSTECTOMY    . CYSTECTOMY    . HERNIA REPAIR    . TUBAL LIGATION       OB History   No obstetric history on file.     Family History  Problem Relation Age of Onset  . Rheum arthritis Father   . Breast cancer Father        died  . Alcohol abuse Father   . Lupus Maternal Aunt   . Rheum arthritis Mother    . Rheum arthritis Maternal Grandmother   . Cancer Maternal Grandmother   . Rheum arthritis Maternal Grandfather   . Rheum arthritis Paternal Grandfather   . Rheum arthritis Paternal Grandmother     Social History   Tobacco Use  . Smoking status: Never Smoker  . Smokeless tobacco: Never Used  Vaping Use  . Vaping Use: Never used  Substance Use Topics  . Alcohol use: No  . Drug use: No    Home Medications Prior to Admission medications   Medication Sig Start Date End Date Taking? Authorizing Provider  albuterol (PROVENTIL HFA;VENTOLIN HFA) 108 (90 Base) MCG/ACT inhaler Inhale 1-2 puffs into the lungs every 4 (four) hours as needed for wheezing or shortness of breath. 12/19/18   Wynona Luna, MD  cyclobenzaprine (FLEXERIL) 10 MG tablet Take 1 tablet (10 mg total) by mouth 2 (two) times daily as needed for muscle spasms. 08/28/20   Scot Jun, FNP  furosemide (LASIX) 20 MG tablet Take 1 tablet (20 mg total) by mouth daily as needed for edema. 08/28/20   Scot Jun, FNP  Multiple Vitamins-Minerals (EMERGEN-C VITAMIN C PO) Take 1,000 mg by mouth daily.    [provider]  ondansetron (ZOFRAN ODT) 4 MG disintegrating tablet Take 1 tablet (4 mg total) by mouth every 8 (eight) hours as  needed for nausea or vomiting. 12/07/20   Joy, Shawn C, PA-C  predniSONE (STERAPRED UNI-PAK 21 TAB) 10 MG (21) TBPK tablet Take 6 tabs (60mg ) day 1, 5 tabs (50mg ) day 2, 4 tabs (40mg ) day 3, 3 tabs (30mg ) day 4, 2 tabs (20mg ) day 5, and 1 tab (10mg ) day 6. 12/07/20   Joy, Shawn C, PA-C  propranolol ER (INDERAL LA) 60 MG 24 hr capsule Take 1 capsule (60 mg total) by mouth at bedtime. 08/28/20   Scot Jun, FNP    Allergies    Bee venom, Doxycycline, Diclofenac, Metoclopramide, and Tramadol  Review of Systems   Review of Systems  Constitutional: Negative for fever.  HENT: Negative for rhinorrhea and sore throat.   Eyes: Negative for redness.  Respiratory: Negative for  cough and shortness of breath.   Cardiovascular: Negative for chest pain.  Gastrointestinal: Positive for abdominal pain, diarrhea, nausea and vomiting.  Genitourinary: Negative for dysuria, frequency, hematuria and urgency.  Musculoskeletal: Negative for myalgias.  Skin: Negative for rash.  Neurological: Negative for headaches.    Physical Exam Updated Vital Signs BP (!) 138/105 (BP Location: Left Arm)   Pulse 100   Temp 98.3 F (36.8 C) (Oral)   Resp 12   SpO2 100%   Physical Exam Vitals and nursing note reviewed.  Constitutional:      General: She is not in acute distress.    Appearance: She is well-developed.  HENT:     Head: Normocephalic and atraumatic.     Right Ear: External ear normal.     Left Ear: External ear normal.     Nose: Nose normal.  Eyes:     Conjunctiva/sclera: Conjunctivae normal.  Cardiovascular:     Rate and Rhythm: Normal rate and regular rhythm.     Heart sounds: No murmur heard.   Pulmonary:     Effort: No respiratory distress.     Breath sounds: No wheezing, rhonchi or rales.  Abdominal:     Palpations: Abdomen is soft.     Tenderness: There is abdominal tenderness. There is no guarding or rebound.     Comments: Mild, generalized  Musculoskeletal:     Cervical back: Normal range of motion and neck supple.     Right lower leg: No edema.     Left lower leg: No edema.  Skin:    General: Skin is warm and dry.     Findings: No rash.  Neurological:     General: No focal deficit present.     Mental Status: She is alert. Mental status is at baseline.     Motor: No weakness.  Psychiatric:        Mood and Affect: Mood normal.     ED Results / Procedures / Treatments   Labs (all labs ordered are listed, but only abnormal results are displayed) Labs Reviewed  COMPREHENSIVE METABOLIC PANEL - Abnormal; Notable for the following components:      Result Value   Calcium 8.5 (*)    Albumin 3.2 (*)    AST 78 (*)    ALT 81 (*)    All other  components within normal limits  URINALYSIS, ROUTINE W REFLEX MICROSCOPIC - Abnormal; Notable for the following components:   APPearance HAZY (*)    All other components within normal limits  GASTROINTESTINAL PANEL BY PCR, STOOL (REPLACES STOOL CULTURE)  LIPASE, BLOOD  CBC    EKG None  Radiology No results found.  Procedures Procedures   Medications Ordered  in ED Medications  sodium chloride 0.9 % bolus 1,000 mL (has no administration in time range)  dicyclomine (BENTYL) capsule 10 mg (has no administration in time range)    ED Course  I have reviewed the triage vital signs and the nursing notes.  Pertinent labs & imaging results that were available during my care of the patient were reviewed by me and considered in my medical decision making (see chart for details).  Patient seen and examined.  Work-up reviewed to this point.  Patient has minimally elevated transaminases, otherwise normal work-up.  Will give IV fluids.  Discussed role of imaging with patient.  Given duration of symptoms, will obtain CT imaging to evaluate for signs of enteritis or colitis, and rule out other significant pathology.  If able, will obtain GI pathogen panel.  Otherwise, patient appears well, nontoxic, a bit uncomfortable but in no distress.  Vital signs reviewed and are as follows: BP (!) 138/105 (BP Location: Left Arm)   Pulse 100   Temp 98.3 F (36.8 C) (Oral)   Resp 12   SpO2 100%   3:01 PM Signout to Dr. Lanny Hurst at shift change. Plan: IV fluids,  f/u CT scan, d/c with supportive care if neg.     MDM Rules/Calculators/A&P                          Pending completion of work-up.   Final Clinical Impression(s) / ED Diagnoses Final diagnoses:  Nausea vomiting and diarrhea  Generalized abdominal pain    Rx / DC Orders ED Discharge Orders    None       Carlisle Cater, PA-C 02/06/21 1503    Breck Coons, MD 02/07/21 [phone]

## 2021-02-06 NOTE — ED Notes (Signed)
Pt refused vital signs upon discharge

## 2021-02-06 NOTE — Discharge Instructions (Addendum)
Please read and follow all provided instructions.  Your diagnoses today include: No diagnosis found.  TTests performed today include: Blood cell counts and platelets  Kidney and liver function tests - slightly elevated  Pancreas function test (called lipase) Urine test to look for infection A blood or urine test for pregnancy (women only) Vital signs. See below for your results today.   Medications prescribed:   Take any prescribed medications only as directed.  Home care instructions:  Follow any educational materials contained in this packet.  Your abdominal pain, nausea, vomiting, and diarrhea may be caused by a viral gastroenteritis also called 'stomach flu'. You should rest for the next several days. Keep drinking plenty of fluids and use the medicine for nausea as directed.   Drink clear liquids for the next 24 hours and introduce solid foods slowly after 24 hours using the b.r.a.t. diet (Bananas, Rice, Applesauce, Toast, Yogurt).    Follow-up instructions: Please follow-up with your primary care provider in the next 2 days for further evaluation of your symptoms. If you are not feeling better in 48 hours you may have a condition that is more serious and you need re-evaluation.   Return instructions:  SEEK IMMEDIATE MEDICAL ATTENTION IF: If you have pain that does not go away or becomes severe  A temperature above 101F develops  Repeated vomiting occurs (multiple episodes)  If you have pain that becomes localized to portions of the abdomen. The right side could possibly be appendicitis. In an adult, the left lower portion of the abdomen could be colitis or diverticulitis.  Blood is being passed in stools or vomit (bright red or black tarry stools)  You develop chest pain, difficulty breathing, dizziness or fainting, or become confused, poorly responsive, or inconsolable (young children) If you have any other emergent concerns regarding your health  Additional  Information: Abdominal (belly) pain can be caused by many things. Your caregiver performed an examination and possibly ordered blood/urine tests and imaging (CT scan, x-rays, ultrasound). Many cases can be observed and treated at home after initial evaluation in the emergency department. Even though you are being discharged home, abdominal pain can be unpredictable. Therefore, you need a repeated exam if your pain does not resolve, returns, or worsens. Most patients with abdominal pain don't have to be admitted to the hospital or have surgery, but serious problems like appendicitis and gallbladder attacks can start out as nonspecific pain. Many abdominal conditions cannot be diagnosed in one visit, so follow-up evaluations are very important.  Your vital signs today were: BP 109/80    Pulse 75    Temp 98.3 F (36.8 C) (Oral)    Resp 18    SpO2 99%  If your blood pressure (bp) was elevated above 135/85 this visit, please have this repeated by your doctor within one month. --------------

## 2021-05-09 ENCOUNTER — Other Ambulatory Visit: Payer: Self-pay | Admitting: Family Medicine

## 2021-05-09 DIAGNOSIS — E039 Hypothyroidism, unspecified: Secondary | ICD-10-CM

## 2021-05-20 ENCOUNTER — Other Ambulatory Visit: Payer: Self-pay | Admitting: Family Medicine

## 2021-05-20 ENCOUNTER — Ambulatory Visit
Admission: RE | Admit: 2021-05-20 | Discharge: 2021-05-20 | Disposition: A | Discharge status: Home / Self Care | Payer: BC Managed Care – PPO | Source: Ambulatory Visit | Attending: Family Medicine | Admitting: Family Medicine

## 2021-05-20 DIAGNOSIS — E039 Hypothyroidism, unspecified: Secondary | ICD-10-CM

## 2021-06-05 ENCOUNTER — Other Ambulatory Visit: Payer: Self-pay

## 2021-06-05 ENCOUNTER — Encounter (HOSPITAL_COMMUNITY): Payer: Self-pay | Admitting: Pharmacy Technician

## 2021-06-05 ENCOUNTER — Ambulatory Visit (HOSPITAL_COMMUNITY)
Admission: EM | Admit: 2021-06-05 | Discharge: 2021-06-05 | Disposition: A | Discharge status: Other Acute Inpatient Hospital | Payer: BC Managed Care – PPO

## 2021-06-05 ENCOUNTER — Emergency Department (HOSPITAL_COMMUNITY)
Admission: EM | Admit: 2021-06-05 | Discharge: 2021-06-05 | Disposition: A | Discharge status: Home / Self Care | Payer: BC Managed Care – PPO | Attending: Emergency Medicine | Admitting: Emergency Medicine

## 2021-06-05 DIAGNOSIS — Z5321 Procedure and treatment not carried out due to patient leaving prior to being seen by health care provider: Secondary | ICD-10-CM | POA: Insufficient documentation

## 2021-06-05 DIAGNOSIS — L299 Pruritus, unspecified: Secondary | ICD-10-CM | POA: Diagnosis not present

## 2021-06-05 DIAGNOSIS — R070 Pain in throat: Secondary | ICD-10-CM | POA: Insufficient documentation

## 2021-06-05 DIAGNOSIS — R059 Cough, unspecified: Secondary | ICD-10-CM | POA: Insufficient documentation

## 2021-06-05 DIAGNOSIS — R0602 Shortness of breath: Secondary | ICD-10-CM | POA: Insufficient documentation

## 2021-06-05 NOTE — ED Triage Notes (Signed)
Pt here with reports of shob since Sunday. Pt states she was in a small room and sprayed lysol and thinks she breathed in too much. Pt reports rash to arms as well. Pt also complaining her throat is hurting and feels like it is closing. Pt speaking in complete sentences in NAD.

## 2021-06-05 NOTE — ED Provider Notes (Signed)
Emergency Medicine Provider Triage Evaluation Note  [name] , a 45 y.o. female  was evaluated in triage.  Pt complains of  To complaints: 1 Lysol exposure: She reports that she feels like her throat is hurting, she has been coughing.  She states this happened after she sprayed Lysol while in a small enclosed room.  She feels like her throat is closing.  She has tried Benadryl, Tylenol and multiple others without relief in her symptoms.  Redness and rash: She reports that for 2 weeks she has had redness and rash.  She reports this is very pruritic.  No fevers. Her work gave her COVID test when her symptoms started.  She denies any known sick contacts..  Review of Systems  Positive: Shortness of breath, throat pain Negative: Chest pain  Physical Exam  BP (!) 150/105 (BP Location: Right Wrist)   Pulse 83   Temp 99 F (37.2 C) (Oral)   Resp 20   SpO2 100%  Gen:   Awake, no distress   Resp:  Normal effort normal phonation without dyspnea.  No hot potato or muffled voice.  No trismus. MSK:   Moves extremities without difficulty  Other:  Throat is clear and moist.  No tonsillar enlargement or exudates, uvula is midline. There are multiple pustules visualized primarily on the right arm with secondary excoriation.  Medical Decision Making  Medically screening exam initiated at 7:03 PM.  Appropriate orders placed.  ANNALIZ AVEN was informed that the remainder of the evaluation will be completed by another provider, this initial triage assessment does not replace that evaluation, and the importance of remaining in the ED until their evaluation is complete.     Ollen Gross 06/05/21 1905    Wyvonnia Dusky, MD 06/06/21 Benancio Deeds

## 2021-06-05 NOTE — ED Notes (Signed)
Patient called for room x4 no reply. Not seen in lobby for some time.

## 2021-06-05 NOTE — ED Notes (Signed)
Patient is being discharged from the Urgent Care and sent to the Emergency Department via personal vehicle. Per Rudene Anda, patient is in need of higher level of care due to Inhalation of Lysol Spray and Throat Swelling. Patient is aware and verbalizes understanding of plan of care. There were no vitals filed for this visit.

## 2021-07-16 ENCOUNTER — Ambulatory Visit
Admission: RE | Admit: 2021-07-16 | Discharge: 2021-07-16 | Disposition: A | Discharge status: Home / Self Care | Payer: BC Managed Care – PPO | Source: Ambulatory Visit | Attending: Physician Assistant | Admitting: Physician Assistant

## 2021-07-16 ENCOUNTER — Other Ambulatory Visit: Payer: Self-pay

## 2021-07-16 ENCOUNTER — Other Ambulatory Visit: Payer: Self-pay | Admitting: Physician Assistant

## 2021-07-16 DIAGNOSIS — M546 Pain in thoracic spine: Secondary | ICD-10-CM

## 2021-07-16 DIAGNOSIS — M79642 Pain in left hand: Secondary | ICD-10-CM

## 2021-07-16 DIAGNOSIS — M79641 Pain in right hand: Secondary | ICD-10-CM

## 2021-07-16 DIAGNOSIS — M5440 Lumbago with sciatica, unspecified side: Secondary | ICD-10-CM

## 2021-07-16 DIAGNOSIS — M542 Cervicalgia: Secondary | ICD-10-CM

## 2021-07-23 ENCOUNTER — Encounter (HOSPITAL_COMMUNITY): Payer: Self-pay | Admitting: *Deleted

## 2021-07-23 ENCOUNTER — Other Ambulatory Visit: Payer: Self-pay

## 2021-07-23 ENCOUNTER — Emergency Department (HOSPITAL_COMMUNITY)
Admission: EM | Admit: 2021-07-23 | Discharge: 2021-07-23 | Disposition: A | Discharge status: Home / Self Care | Payer: BC Managed Care – PPO | Attending: Emergency Medicine | Admitting: Emergency Medicine

## 2021-07-23 DIAGNOSIS — J45909 Unspecified asthma, uncomplicated: Secondary | ICD-10-CM | POA: Insufficient documentation

## 2021-07-23 DIAGNOSIS — R21 Rash and other nonspecific skin eruption: Secondary | ICD-10-CM | POA: Insufficient documentation

## 2021-07-23 DIAGNOSIS — R109 Unspecified abdominal pain: Secondary | ICD-10-CM | POA: Diagnosis not present

## 2021-07-23 MED ORDER — DEXAMETHASONE SODIUM PHOSPHATE 10 MG/ML IJ SOLN
10.0000 mg | Freq: Once | INTRAMUSCULAR | Status: AC
  Administered 2021-07-23: 10 mg via INTRAMUSCULAR
  Filled 2021-07-23: qty 1

## 2021-07-23 NOTE — ED Provider Notes (Signed)
Pt seen in conjunction with C Prosperi, PA-C. Please see his notes for full history and exam.  In brief, patient presenting for evaluation of rash.  Has tried multiple things including steroid creams, Benadryl, hydroxyzine, calamine lotion, steroids.  Has not seen dermatology.  On exam, rash is confluent macular rash without blisters, bullae, pustules.  No mucous membrane involvement.  No fevers.  Will treat symptomatically and have patient follow-up with dermatology.   Franchot Heidelberg, PA-C 07/23/21 1639    Davonna Belling, MD 07/23/21 2324

## 2021-07-23 NOTE — ED Provider Notes (Signed)
[facility] EMERGENCY DEPARTMENT Provider Note   CSN: [id number] Arrival date & time: 07/23/21  1145     History Chief Complaint  Patient presents with   Rash    [name] is a 45 y.o. female with a PMH significant for rheumatoid arthritis and asthma who presents for an ongoing itchy, red, maculopapular rash today that she reports she has experienced in some capacity for years, but is worsening and persistent for the last 15 days. The rash is located on her anterior and posterior left thigh, on her right popliteal fossa, on bilateral buttocks, and on bilateral upper arms. The patient also reports she has had problems with losing her fingernails a few years ago and that they never returned. The patient reports she has a sister with lupus. Patient has been prescribed triamcinolone cream, a medrol dose pack, oral and topical benadryl, and been given hydroxyzine for itching when seen by EM at the end of July. Patient reports no improvements with the above treatments. Patient has had an IM solumedrol injection to the left thigh which she reports was helpful in the short term. Patient denies medication changes that occurred around the time of the rash worsening, but has begun Belbuca per her spine doctor in the last week. Patient denies dietary changes, new soaps or detergents. Patient does mention some abdominal pain and GI disturbance that have been ongoing but with no clear association with rash.    Rash Associated symptoms: abdominal pain and joint pain   Associated symptoms: no fatigue and no fever       Past Medical History:  Diagnosis Date   Asthma    diagnosed as  a young adult    IBS (irritable bowel syndrome)    Rheumatoid arthritis(714.0)     Patient Active Problem List   Diagnosis Date Noted   Systemic viral illness 11/13/2012   Morbid obesity with BMI of 40.0-44.9, adult ([place]) 11/13/2012   Tachycardia 11/13/2012   Rheumatoid arthritis ([place])     Asthma     Past Surgical History:  Procedure Laterality Date   ABDOMINAL HYSTERECTOMY     APPENDECTOMY     BREAST REDUCTION SURGERY     CHOLECYSTECTOMY     CYSTECTOMY     HERNIA REPAIR     TUBAL LIGATION       OB History   No obstetric history on file.     Family History  Problem Relation Age of Onset   Rheum arthritis Father    Breast cancer Father        died   Alcohol abuse Father    Lupus Maternal Aunt    Rheum arthritis Mother    Rheum arthritis Maternal Grandmother    Cancer Maternal Grandmother    Rheum arthritis Maternal Grandfather    Rheum arthritis Paternal Grandfather    Rheum arthritis Paternal Grandmother     Social History   Tobacco Use   Smoking status: Never   Smokeless tobacco: Never  Vaping Use   Vaping Use: Never used  Substance Use Topics   Alcohol use: No   Drug use: No    Home Medications Prior to Admission medications   Medication Sig Start Date End Date Taking? Authorizing Provider  albuterol (PROVENTIL HFA;VENTOLIN HFA) 108 (90 Base) MCG/ACT inhaler Inhale 1-2 puffs into the lungs every 4 (four) hours as needed for wheezing or shortness of breath. 12/19/18  Yes Wynona Luna, MD  Buprenorphine HCl (BELBUCA) [place]  1 Film inside cheek every 12 (twelve) hours.   Yes [provider]  empagliflozin (JARDIANCE) 10 MG TABS tablet Take 10 mg by mouth daily.   Yes [provider]  famotidine (PEPCID) 20 MG tablet Take 20 mg by mouth daily.   Yes [provider]  hydrOXYzine (ATARAX/VISTARIL) 25 MG tablet Take 25 mg by mouth 3 (three) times daily as needed for itching.   Yes [provider]  Multiple Vitamins-Minerals (EMERGEN-C VITAMIN C PO) Take 1,000 mg by mouth daily.   Yes [provider]  tiZANidine (ZANAFLEX) 4 MG tablet Take 4 mg by mouth daily.   Yes [provider]  triamcinolone ointment (KENALOG) 0.1 % Apply 1 application topically 2 (two) times daily.   Yes  [provider]  cyclobenzaprine (FLEXERIL) 10 MG tablet Take 1 tablet (10 mg total) by mouth 2 (two) times daily as needed for muscle spasms. Patient not taking: Reported on 07/23/2021 08/28/20   Scot Jun, FNP  furosemide (LASIX) 20 MG tablet Take 1 tablet (20 mg total) by mouth daily as needed for edema. Patient not taking: Reported on 07/23/2021 08/28/20   Scot Jun, FNP  lidocaine (LIDODERM) 5 % Place 1 patch onto the skin daily. Remove & Discard patch within 12 hours or as directed by MD Patient not taking: Reported on 07/23/2021 02/06/21   Christy Gentles, MD  methocarbamol (ROBAXIN) 500 MG tablet Take 1 tablet (500 mg total) by mouth 2 (two) times daily. Patient not taking: Reported on 07/23/2021 02/06/21   Christy Gentles, MD  ondansetron (ZOFRAN ODT) 4 MG disintegrating tablet Take 1 tablet (4 mg total) by mouth every 8 (eight) hours as needed for nausea or vomiting. Patient not taking: Reported on 07/23/2021 12/07/20   Joy, Helane Gunther, PA-C  predniSONE (STERAPRED UNI-PAK 21 TAB) 10 MG (21) TBPK tablet Take 6 tabs ('60mg'$ ) day 1, 5 tabs ('50mg'$ ) day 2, 4 tabs ('40mg'$ ) day 3, 3 tabs ('30mg'$ ) day 4, 2 tabs ('20mg'$ ) day 5, and 1 tab ('10mg'$ ) day 6. Patient not taking: Reported on 07/23/2021 12/07/20   Joy, Raquel Sarna C, PA-C  propranolol ER (INDERAL LA) 60 MG 24 hr capsule Take 1 capsule (60 mg total) by mouth at bedtime. Patient not taking: Reported on 07/23/2021 08/28/20   Scot Jun, FNP    Allergies    Bee venom, Doxycycline, Diclofenac, Metoclopramide, and Tramadol  Review of Systems   Review of Systems  Constitutional:  Negative for fatigue, fever and unexpected weight change.  Eyes:  Negative for redness and itching.  Gastrointestinal:  Positive for abdominal pain.  Musculoskeletal:  Positive for arthralgias and back pain.  Skin:  Positive for rash.  Allergic/Immunologic: Positive for environmental allergies. Negative for food allergies.   Physical Exam Updated Vital  Signs BP (!) 155/106 (BP Location: Left Wrist)   Pulse 86   Resp 20   Ht '5\' 5"'$  (1.651 m)   Wt 130.2 kg   SpO2 100%   BMI 47.76 kg/m   Physical Exam Constitutional:      Appearance: Normal appearance. She is obese.  HENT:     Head: Normocephalic and atraumatic.     Nose: No rhinorrhea.  Eyes:     General:        Right eye: No discharge.        Left eye: No discharge.     Pupils: Pupils are equal, round, and reactive to light.  Cardiovascular:     Rate and Rhythm: Normal rate  and regular rhythm.  Pulmonary:     Effort: Pulmonary effort is normal. No respiratory distress.     Breath sounds: Normal breath sounds.  Skin:    General: Skin is warm.     Findings: Erythema and rash present.     Comments: Absent fingernails on bilateral hands per patient, unable to assess due to press on nails  Neurological:     General: No focal deficit present.     Mental Status: She is alert and oriented to person, place, and time.  Psychiatric:        Mood and Affect: Mood normal.        Behavior: Behavior normal.    ED Results / Procedures / Treatments   Labs (all labs ordered are listed, but only abnormal results are displayed) Labs Reviewed - No data to display  EKG None  Radiology No results found.  Procedures Procedures   Medications Ordered in ED Medications  dexamethasone (DECADRON) injection 10 mg (has no administration in time range)    ED Course  I have reviewed the triage vital signs and the nursing notes.  Pertinent labs & imaging results that were available during my care of the patient were reviewed by me and considered in my medical decision making (see chart for details).    MDM Rules/Calculators/A&P                         Patient presentation consistent with inflammatory rash of unknown origin. Discussed with patient possibility of allergic or contact dermatitis, do not favor drug eruption considering no medications concurrent with rash appearance. Rash has  been unresponsive to antihistamine, topical and oral steroids, and has responded temporarily to injection of intramuscular steroid. Patient also experiencing marked pruritus. Patient already given hydroxyzine and benadryl, okay to continue these prescriptions. Rash does not appear fungal in appearance, is not localized to intertriginous zones, does not appear infected, and patient without associated consitutional symptoms at this time. No evidence of bullae or pus filled papules. Given unclear nature of rash, recommend patient follow up with allergist and dermatologist for further investigation that may include allergy testing, punch biopsy, evaluation of autoimmune disorders. Given previous relief from IM steroid, will administer '10mg'$  decadron injection today. Patient agrees with plan. Final Clinical Impression(s) / ED Diagnoses Final diagnoses:  Rash and nonspecific skin eruption    Rx / DC Orders ED Discharge Orders     None        Dorien Chihuahua 07/23/21 1707    Davonna Belling, MD 07/23/21 2321

## 2021-07-23 NOTE — Discharge Instructions (Addendum)
As we discussed, this rash appears dry and irritated from itching. Cause is unknown at this time but warrants further investigation by dermatology / allergist. Recommended patient not itch the affected areas as much as possible. Encouraged use of otc moisturizer multiple times daily to affected areas, and keeping the areas exposed to air. Patient okay to continue prescribed triamcinolone, benadryl creams up to twice daily to affected area. Recommend patient follow up with asthma and allergy clinic, as well as with dermatology, and perhaps rheumatology given history of RA and family history of lupus.

## 2021-07-23 NOTE — ED Provider Notes (Signed)
Emergency Medicine Provider Triage Evaluation Note  [name] , a 45 y.o. female  was evaluated in triage.  Pt complains of rash.  Review of Systems  Positive: rash Negative: fever  Physical Exam  BP (!) 155/106 (BP Location: Left Wrist)   Pulse 86   Resp 20   Ht '5\' 5"'$  (1.651 m)   Wt 130.2 kg   SpO2 100%   BMI 47.76 kg/m  Gen:   Awake, no distress   Resp:  Normal effort  MSK:   Moves extremities without difficulty  Other:  Erythematous, warm rash noted to the bilat upper thighs  Medical Decision Making  Medically screening exam initiated at 12:58 PM.  Appropriate orders placed.  [name] was informed that the remainder of the evaluation will be completed by another provider, this initial triage assessment does not replace that evaluation, and the importance of remaining in the ED until their evaluation is complete.     Bishop Dublin 07/23/21 1258    Milton Ferguson, MD 07/27/21 1028

## 2021-07-23 NOTE — ED Triage Notes (Signed)
Pt states red, pruritic rash that feels as if it burns. Seen several times for same, but it is not improving.

## 2021-11-19 ENCOUNTER — Encounter (HOSPITAL_COMMUNITY): Payer: Self-pay | Admitting: Emergency Medicine

## 2021-11-19 ENCOUNTER — Emergency Department (HOSPITAL_COMMUNITY)
Admission: EM | Admit: 2021-11-19 | Discharge: 2021-11-20 | Disposition: A | Discharge status: Home / Self Care | Payer: BC Managed Care – PPO | Attending: Emergency Medicine | Admitting: Emergency Medicine

## 2021-11-19 ENCOUNTER — Other Ambulatory Visit: Payer: Self-pay

## 2021-11-19 ENCOUNTER — Emergency Department (HOSPITAL_COMMUNITY): Payer: BC Managed Care – PPO

## 2021-11-19 DIAGNOSIS — D72819 Decreased white blood cell count, unspecified: Secondary | ICD-10-CM | POA: Insufficient documentation

## 2021-11-19 DIAGNOSIS — R109 Unspecified abdominal pain: Secondary | ICD-10-CM | POA: Insufficient documentation

## 2021-11-19 DIAGNOSIS — J45909 Unspecified asthma, uncomplicated: Secondary | ICD-10-CM | POA: Diagnosis not present

## 2021-11-19 DIAGNOSIS — J101 Influenza due to other identified influenza virus with other respiratory manifestations: Secondary | ICD-10-CM | POA: Insufficient documentation

## 2021-11-19 DIAGNOSIS — J181 Lobar pneumonia, unspecified organism: Secondary | ICD-10-CM | POA: Insufficient documentation

## 2021-11-19 DIAGNOSIS — R0602 Shortness of breath: Secondary | ICD-10-CM | POA: Diagnosis present

## 2021-11-19 DIAGNOSIS — J189 Pneumonia, unspecified organism: Secondary | ICD-10-CM

## 2021-11-19 DIAGNOSIS — Z20822 Contact with and (suspected) exposure to covid-19: Secondary | ICD-10-CM | POA: Diagnosis not present

## 2021-11-19 LAB — RESP PANEL BY RT-PCR (FLU A&B, COVID) ARPGX2
Influenza A by PCR: POSITIVE — AB
Influenza B by PCR: NEGATIVE
SARS Coronavirus 2 by RT PCR: NEGATIVE

## 2021-11-19 MED ORDER — ACETAMINOPHEN 500 MG PO TABS
1000.0000 mg | ORAL_TABLET | Freq: Once | ORAL | Status: DC
Start: 2021-11-19 — End: 2021-11-20

## 2021-11-19 NOTE — ED Provider Notes (Signed)
Emergency Medicine Provider Triage Evaluation Note  [name] , a 45 y.o. female  was evaluated in triage.  Pt complains of productive cough for the past 3 days.  Patient also complains of chest tightness, shortness of breath, fever with T-max 101.1 yesterday.  She has not had a fever today.  She denies any recent sick contacts.  She received an antigen and PCR COVID test at work which is negative.  She is unvaccinated for the flu.  She denies any recent sick contacts.  She does report history of asthma and last use 2 puffs of her albuterol inhaler today without improvement in her symptoms.  Review of Systems  Positive: + chest tightness, SOB, fever, cough, body aches, rhinorrhea Negative: - sore throat, headache  Physical Exam  There were no vitals taken for this visit. Gen:   Awake, no distress   Resp:  Normal effort  MSK:   Moves extremities without difficulty  Other:  End expiratory wheeze. Speaking in full sentences without difficulty. Satting 100% on RA.   Medical Decision Making  Medically screening exam initiated at 6:17 PM.  Appropriate orders placed.  [name] was informed that the remainder of the evaluation will be completed by another provider, this initial triage assessment does not replace that evaluation, and the importance of remaining in the ED until their evaluation is complete.     Eustaquio Maize, PA-C 11/19/21 1818    Carmin Muskrat, MD 11/19/21 [phone]

## 2021-11-19 NOTE — ED Triage Notes (Signed)
Pt c/o shortness of breath, chest pain, cough x 3 days. Reports fever yesterday, but none today.

## 2021-11-19 NOTE — ED Notes (Signed)
Called pt x3 for MSE signature, no response.

## 2021-11-20 LAB — CBC WITH DIFFERENTIAL/PLATELET
Abs Immature Granulocytes: 0 10*3/uL (ref 0.00–0.07)
Basophils Absolute: 0 10*3/uL (ref 0.0–0.1)
Basophils Relative: 0 %
Eosinophils Absolute: 0 10*3/uL (ref 0.0–0.5)
Eosinophils Relative: 0 %
HCT: 44.4 % (ref 36.0–46.0)
Hemoglobin: 14.2 g/dL (ref 12.0–15.0)
Lymphocytes Relative: 50 %
Lymphs Abs: 1.2 10*3/uL (ref 0.7–4.0)
MCH: 29.2 pg (ref 26.0–34.0)
MCHC: 32 g/dL (ref 30.0–36.0)
MCV: 91.4 fL (ref 80.0–100.0)
Monocytes Absolute: 0.3 10*3/uL (ref 0.1–1.0)
Monocytes Relative: 12 %
Neutro Abs: 0.9 10*3/uL — ABNORMAL LOW (ref 1.7–7.7)
Neutrophils Relative %: 38 %
Platelets: 269 10*3/uL (ref 150–400)
RBC: 4.86 MIL/uL (ref 3.87–5.11)
RDW: 13.4 % (ref 11.5–15.5)
WBC: 2.3 10*3/uL — ABNORMAL LOW (ref 4.0–10.5)
nRBC: 0 % (ref 0.0–0.2)
nRBC: 0 /100 WBC

## 2021-11-20 LAB — COMPREHENSIVE METABOLIC PANEL
ALT: 17 U/L (ref 0–44)
AST: 20 U/L (ref 15–41)
Albumin: 3.9 g/dL (ref 3.5–5.0)
Alkaline Phosphatase: 78 U/L (ref 38–126)
Anion gap: 11 (ref 5–15)
BUN: 7 mg/dL (ref 6–20)
CO2: 22 mmol/L (ref 22–32)
Calcium: 9.4 mg/dL (ref 8.9–10.3)
Chloride: 102 mmol/L (ref 98–111)
Creatinine, Ser: 0.7 mg/dL (ref 0.44–1.00)
GFR, Estimated: >60 mL/min (ref >60)
Glucose, Bld: 104 mg/dL — ABNORMAL HIGH (ref 70–99)
Potassium: 3.6 mmol/L (ref 3.5–5.1)
Sodium: 135 mmol/L (ref 135–145)
Total Bilirubin: 0.8 mg/dL (ref 0.3–1.2)
Total Protein: 8.3 g/dL — ABNORMAL HIGH (ref 6.5–8.1)

## 2021-11-20 LAB — URINALYSIS, ROUTINE W REFLEX MICROSCOPIC
Bilirubin Urine: NEGATIVE
Glucose, UA: NEGATIVE mg/dL
Hgb urine dipstick: NEGATIVE
Ketones, ur: 15 mg/dL — AB
Leukocytes,Ua: NEGATIVE
Nitrite: NEGATIVE
Protein, ur: NEGATIVE mg/dL
Specific Gravity, Urine: >1.03 — ABNORMAL HIGH (ref 1.005–1.030)
pH: 6 (ref 5.0–8.0)

## 2021-11-20 MED ORDER — AZITHROMYCIN 250 MG PO TABS: 250.0000 mg | ORAL_TABLET | Freq: Every day | ORAL | 0 refills | Status: DC

## 2021-11-20 MED ORDER — SODIUM CHLORIDE 0.9 % IV BOLUS
500.0000 mL | Freq: Once | INTRAVENOUS | Status: AC
  Administered 2021-11-20: 500 mL via INTRAVENOUS

## 2021-11-20 MED ORDER — CYCLOBENZAPRINE HCL 10 MG PO TABS
10.0000 mg | ORAL_TABLET | Freq: Once | ORAL | Status: AC
  Administered 2021-11-20: 10 mg via ORAL
  Filled 2021-11-20: qty 1

## 2021-11-20 MED ORDER — KETOROLAC TROMETHAMINE 15 MG/ML IJ SOLN
15.0000 mg | Freq: Once | INTRAMUSCULAR | Status: AC
  Administered 2021-11-20: 15 mg via INTRAVENOUS
  Filled 2021-11-20: qty 1

## 2021-11-20 MED ORDER — ONDANSETRON HCL 4 MG/2ML IJ SOLN
4.0000 mg | Freq: Once | INTRAMUSCULAR | Status: AC
  Administered 2021-11-20: 4 mg via INTRAVENOUS
  Filled 2021-11-20: qty 2

## 2021-11-20 NOTE — Discharge Instructions (Signed)
Follow-up with your primary care doctor to discuss these symptoms.  For the possible pneumonia take the prescribed antibiotic.  If you develop worsening pain, vomiting, fever, any difficulty breathing or chest pain, please return to ER for reassessment.

## 2021-11-20 NOTE — ED Notes (Signed)
Pt ambulated to restroom. 

## 2021-11-20 NOTE — ED Provider Notes (Signed)
[facility] EMERGENCY DEPARTMENT Provider Note   CSN: [id number] Arrival date & time: 11/19/21  1744     History Chief Complaint  Patient presents with   Shortness of Breath    [name] is a 45 y.o. female.  Presents for shortness of breath.  Reports that she is noticed cough, some shortness of breath over the past 3 days.  Symptoms are associated with a little bit of production, mostly clear sputum.  No blood.  Had fever yesterday to 101.  No fever today, low-grade temp, still having some chills.  No congestion.  Over the last week or 2 has also had pain on the right side of her abdomen/low back.  Denies any associated chest pain.  Currently does not feel short of breath.  Does endorse history of asthma as he had to use her inhaler a couple times over the last couple days.  No burning with urination or blood in urine.  HPI     Past Medical History:  Diagnosis Date   Asthma    diagnosed as  a young adult    IBS (irritable bowel syndrome)    Rheumatoid arthritis(714.0)     Patient Active Problem List   Diagnosis Date Noted   Systemic viral illness 11/13/2012   Morbid obesity with BMI of 40.0-44.9, adult ([place]) 11/13/2012   Tachycardia 11/13/2012   Rheumatoid arthritis ([place])    Asthma     Past Surgical History:  Procedure Laterality Date   ABDOMINAL HYSTERECTOMY     APPENDECTOMY     BREAST REDUCTION SURGERY     CHOLECYSTECTOMY     CYSTECTOMY     HERNIA REPAIR     TUBAL LIGATION       OB History   No obstetric history on file.     Family History  Problem Relation Age of Onset   Rheum arthritis Father    Breast cancer Father        died   Alcohol abuse Father    Lupus Maternal Aunt    Rheum arthritis Mother    Rheum arthritis Maternal Grandmother    Cancer Maternal Grandmother    Rheum arthritis Maternal Grandfather    Rheum arthritis Paternal Grandfather    Rheum arthritis Paternal Grandmother     Social History   Tobacco  Use   Smoking status: Never   Smokeless tobacco: Never  Vaping Use   Vaping Use: Never used  Substance Use Topics   Alcohol use: No   Drug use: No    Home Medications Prior to Admission medications   Medication Sig Start Date End Date Taking? Authorizing Provider  albuterol (PROVENTIL HFA;VENTOLIN HFA) 108 (90 Base) MCG/ACT inhaler Inhale 1-2 puffs into the lungs every 4 (four) hours as needed for wheezing or shortness of breath. 12/19/18  Yes Wynona Luna, MD  azithromycin (ZITHROMAX) 250 MG tablet Take 1 tablet (250 mg total) by mouth daily. Take first 2 tablets together, then 1 every day until finished. 11/20/21  Yes Lucrezia Starch, MD  BELBUCA 300 MCG FILM Take 1 Film by mouth daily. 10/20/21  Yes [provider]  guaiFENesin (ROBITUSSIN) 100 MG/5ML liquid Take 5 mLs by mouth every 4 (four) hours as needed for cough or to loosen phlegm.   Yes [provider]  LORazepam (ATIVAN) 1 MG tablet Take 1 mg by mouth every 8 (eight) hours as needed for anxiety or sleep. 08/02/21  Yes [provider]  naloxone Karma Greaser) nasal  spray 4 mg/0.1 mL 1 spray as needed (overdose). 08/13/21  Yes [provider]  pregabalin (LYRICA) 50 MG capsule Take 50 mg by mouth at bedtime. 10/20/21  Yes [provider]  tiZANidine (ZANAFLEX) 4 MG tablet Take 4 mg by mouth every 6 (six) hours as needed for muscle spasms.   Yes [provider]  cyclobenzaprine (FLEXERIL) 10 MG tablet Take 1 tablet (10 mg total) by mouth 2 (two) times daily as needed for muscle spasms. Patient not taking: Reported on 07/23/2021 08/28/20   Scot Jun, FNP  empagliflozin (JARDIANCE) 10 MG TABS tablet Take 10 mg by mouth daily.    [provider]  famotidine (PEPCID) 20 MG tablet Take 20 mg by mouth daily.    [provider]  furosemide (LASIX) 20 MG tablet Take 1 tablet (20 mg total) by mouth daily as needed for edema. Patient not taking: Reported on  07/23/2021 08/28/20   Scot Jun, FNP  hydrOXYzine (ATARAX/VISTARIL) 25 MG tablet Take 25 mg by mouth 3 (three) times daily as needed for itching.    [provider]  lidocaine (LIDODERM) 5 % Place 1 patch onto the skin daily. Remove & Discard patch within 12 hours or as directed by MD Patient not taking: Reported on 07/23/2021 02/06/21   Christy Gentles, MD  methocarbamol (ROBAXIN) 500 MG tablet Take 1 tablet (500 mg total) by mouth 2 (two) times daily. Patient not taking: Reported on 07/23/2021 02/06/21   Christy Gentles, MD  ondansetron (ZOFRAN ODT) 4 MG disintegrating tablet Take 1 tablet (4 mg total) by mouth every 8 (eight) hours as needed for nausea or vomiting. Patient not taking: Reported on 07/23/2021 12/07/20   Joy, Helane Gunther, PA-C  predniSONE (STERAPRED UNI-PAK 21 TAB) 10 MG (21) TBPK tablet Take 6 tabs (60mg ) day 1, 5 tabs (50mg ) day 2, 4 tabs (40mg ) day 3, 3 tabs (30mg ) day 4, 2 tabs (20mg ) day 5, and 1 tab (10mg ) day 6. Patient not taking: Reported on 07/23/2021 12/07/20   Joy, Raquel Sarna C, PA-C  propranolol ER (INDERAL LA) 60 MG 24 hr capsule Take 1 capsule (60 mg total) by mouth at bedtime. Patient not taking: Reported on 07/23/2021 08/28/20   Scot Jun, FNP    Allergies    Bee venom, Doxycycline, Diclofenac, Metoclopramide, and Tramadol  Review of Systems   Review of Systems  Constitutional:  Positive for chills, fatigue and fever.  HENT:  Negative for ear pain and sore throat.   Eyes:  Negative for pain and visual disturbance.  Respiratory:  Positive for cough and shortness of breath.   Cardiovascular:  Negative for chest pain and palpitations.  Gastrointestinal:  Negative for abdominal pain and vomiting.  Genitourinary:  Positive for flank pain. Negative for dysuria and hematuria.  Musculoskeletal:  Negative for arthralgias and back pain.  Skin:  Negative for color change and rash.  Neurological:  Negative for seizures and syncope.  All other systems reviewed and  are negative.  Physical Exam Updated Vital Signs BP (!) 140/93 (BP Location: Right Arm)   Pulse 89   Temp 97.8 F (36.6 C) (Oral)   Resp 16   SpO2 100%   Physical Exam Vitals and nursing note reviewed.  Constitutional:      General: She is not in acute distress.    Appearance: She is well-developed.  HENT:     Head: Normocephalic and atraumatic.  Eyes:     Conjunctiva/sclera: Conjunctivae normal.  Cardiovascular:  Rate and Rhythm: Normal rate and regular rhythm.     Heart sounds: No murmur heard. Pulmonary:     Effort: Pulmonary effort is normal. No tachypnea, accessory muscle usage or respiratory distress.     Breath sounds: Normal breath sounds. No stridor.  Abdominal:     Palpations: Abdomen is soft.     Tenderness: There is no abdominal tenderness.     Comments: Some tenderness to palpation to right flank, no rebound or guarding  Musculoskeletal:        General: No swelling.     Cervical back: Neck supple.  Skin:    General: Skin is warm and dry.     Capillary Refill: Capillary refill takes less than 2 seconds.  Neurological:     Mental Status: She is alert.  Psychiatric:        Mood and Affect: Mood normal.    ED Results / Procedures / Treatments   Labs (all labs ordered are listed, but only abnormal results are displayed) Labs Reviewed  RESP PANEL BY RT-PCR (FLU A&B, COVID) ARPGX2 - Abnormal; Notable for the following components:      Result Value   Influenza A by PCR POSITIVE (*)    All other components within normal limits  URINALYSIS, ROUTINE W REFLEX MICROSCOPIC - Abnormal; Notable for the following components:   Specific Gravity, Urine >1.030 (*)    Ketones, ur 15 (*)    All other components within normal limits  CBC WITH DIFFERENTIAL/PLATELET - Abnormal; Notable for the following components:   WBC 2.3 (*)    Neutro Abs 0.9 (*)    All other components within normal limits  COMPREHENSIVE METABOLIC PANEL - Abnormal; Notable for the following  components:   Glucose, Bld 104 (*)    Total Protein 8.3 (*)    All other components within normal limits    EKG None  Radiology DG Chest 2 View  Result Date: 11/19/2021 CLINICAL DATA:  A 45 year old female presents with cough and shortness of breath for 3 days with fever yesterday. EXAM: CHEST - 2 VIEW COMPARISON:  December 07, 2020. FINDINGS: Subtle opacity in the retrocardiac region in the LEFT chest. Mildly nodular appearance. Vague area of opacity seen on the lateral projection. No sign of effusion. Cardiomediastinal contours and hilar structures are normal. On limited assessment there is no acute skeletal process. IMPRESSION: Subtle opacity in the retrocardiac region may represent developing infection. Mildly nodular appearance in the retrocardiac region on the frontal view. Consider follow-up PA and lateral chest radiograph in 6-8 weeks after therapy to ensure resolution. Electronically Signed   By: Zetta Bills M.D.   On: 11/19/2021 19:16    Procedures Procedures   Medications Ordered in ED Medications  acetaminophen (TYLENOL) tablet 1,000 mg (has no administration in time range)  ketorolac (TORADOL) 15 MG/ML injection 15 mg (15 mg Intravenous Given 11/20/21 1141)  ondansetron (ZOFRAN) injection 4 mg (4 mg Intravenous Given 11/20/21 1255)  sodium chloride 0.9 % bolus 500 mL (0 mLs Intravenous Stopped 11/20/21 1410)  cyclobenzaprine (FLEXERIL) tablet 10 mg (10 mg Oral Given 11/20/21 1255)    ED Course  I have reviewed the triage vital signs and the nursing notes.  Pertinent labs & imaging results that were available during my care of the patient were reviewed by me and considered in my medical decision making (see chart for details).    MDM Rules/Calculators/A&P  45 year old lady presents to ER with concern for cough, some shortness of breath and chills.  Flu a positive.  Suspect this is culprit for most of her symptoms today.  Lungs are clear to  auscultation.  No hypoxia.  CXR with subtle opacity in retrocardiac region.  Will cover with antibiotics for possible superimposed bacterial pneumonia though suspect most likely symptoms are from influenza.  She also endorsed pain on her right side, localized to her right lower flank.  Will check basic labs, urinalysis.  UA without infection, no hematuria.  Basic labs are all grossly stable.  Noted slight leukopenia.  Suspect related to viral infection.  On reassessment, patient remains well-appearing with stable vital signs.  Will discharge home and recommend follow-up with her primary care doctor.  After the discussed management above, the patient was determined to be safe for discharge.  The patient was in agreement with this plan and all questions regarding their care were answered.  ED return precautions were discussed and the patient will return to the ED with any significant worsening of condition.  Final Clinical Impression(s) / ED Diagnoses Final diagnoses:  Influenza A  Community acquired pneumonia of left lower lobe of lung  Flank pain    Rx / DC Orders ED Discharge Orders          Ordered    azithromycin (ZITHROMAX) 250 MG tablet  Daily        11/20/21 1326             Lucrezia Starch, MD 11/20/21 1419

## 2021-11-20 NOTE — ED Notes (Signed)
ED Provider at bedside. 

## 2021-11-28 ENCOUNTER — Telehealth: Payer: BC Managed Care – PPO | Admitting: Physician Assistant

## 2021-11-28 DIAGNOSIS — J189 Pneumonia, unspecified organism: Secondary | ICD-10-CM

## 2021-11-28 MED ORDER — PREDNISONE 20 MG PO TABS
40.0000 mg | ORAL_TABLET | Freq: Every day | ORAL | 0 refills | Status: DC
Start: 2021-11-28 — End: 2022-04-06

## 2021-11-28 MED ORDER — LEVOFLOXACIN 500 MG PO TABS: 500.0000 mg | ORAL_TABLET | Freq: Every day | ORAL | 0 refills | Status: AC

## 2021-11-28 NOTE — Patient Instructions (Signed)
[name], thank you for joining Mar Daring, PA-C for today's virtual visit.  While this provider is not your primary care provider (PCP), if your PCP is located in our provider database this encounter information will be shared with them immediately following your visit.  Consent: (Patient) [name] provided verbal consent for this virtual visit at the beginning of the encounter.  Current Medications:  Current Outpatient Medications:    levofloxacin (LEVAQUIN) 500 MG tablet, Take 1 tablet (500 mg total) by mouth daily for 7 days., Disp: 7 tablet, Rfl: 0   predniSONE (DELTASONE) 20 MG tablet, Take 2 tablets (40 mg total) by mouth daily with breakfast., Disp: 10 tablet, Rfl: 0   albuterol (PROVENTIL HFA;VENTOLIN HFA) 108 (90 Base) MCG/ACT inhaler, Inhale 1-2 puffs into the lungs every 4 (four) hours as needed for wheezing or shortness of breath., Disp: 1 Inhaler, Rfl: 0   BELBUCA 300 MCG FILM, Take 1 Film by mouth daily., Disp: , Rfl:    furosemide (LASIX) 20 MG tablet, Take 1 tablet (20 mg total) by mouth daily as needed for edema. (Patient not taking: Reported on 07/23/2021), Disp: 30 tablet, Rfl: 1   guaiFENesin (ROBITUSSIN) 100 MG/5ML liquid, Take 5 mLs by mouth every 4 (four) hours as needed for cough or to loosen phlegm., Disp: , Rfl:    LORazepam (ATIVAN) 1 MG tablet, Take 1 mg by mouth every 8 (eight) hours as needed for anxiety or sleep., Disp: , Rfl:    naloxone (NARCAN) nasal spray 4 mg/0.1 mL, 1 spray as needed (overdose)., Disp: , Rfl:    pregabalin (LYRICA) 50 MG capsule, Take 50 mg by mouth at bedtime., Disp: , Rfl:    tiZANidine (ZANAFLEX) 4 MG tablet, Take 4 mg by mouth every 6 (six) hours as needed for muscle spasms., Disp: , Rfl:    Medications ordered in this encounter:  Meds ordered this encounter  Medications   levofloxacin (LEVAQUIN) 500 MG tablet    Sig: Take 1 tablet (500 mg total) by mouth daily for 7 days.    Dispense:  7 tablet    Refill:   0    Order Specific Question:   Supervising Provider    Answer:   Sabra Heck, BRIAN [[id number]]   predniSONE (DELTASONE) 20 MG tablet    Sig: Take 2 tablets (40 mg total) by mouth daily with breakfast.    Dispense:  10 tablet    Refill:  0    Order Specific Question:   Supervising Provider    Answer:   Sabra Heck, BRIAN [[id number]]     *If you need refills on other medications prior to your next appointment, please contact your pharmacy*  Follow-Up: Call back or seek an in-person evaluation if the symptoms worsen or if the condition fails to improve as anticipated.  Other Instructions Community-Acquired Pneumonia, Adult Pneumonia is an infection of the lungs. It causes irritation and swelling in the airways of the lungs. Mucus and fluid may also build up inside the airways. This may cause coughing and trouble breathing. One type of pneumonia can happen while you are in a hospital. A different type can happen when you are not in a hospital (community-acquired pneumonia). What are the causes? This condition is caused by germs (viruses, bacteria, or fungi). Some types of germs can spread from person to person. Pneumonia is not thought to spread from person to person. What increases the risk? You are more likely to develop this condition if: You have a  long-term (chronic) disease, such as: Disease of the lungs. This may be chronic obstructive pulmonary disease (COPD) or asthma. Heart failure. Cystic fibrosis. Diabetes. Kidney disease. Sickle cell disease. HIV. You have other health problems, such as: Your body's defense system (immune system) is weak. A condition that may cause you to breathe in fluids from your mouth and nose. You had your spleen taken out. You do not take good care of your teeth and mouth (poor dental hygiene). You use or have used tobacco products. You travel where the germs that cause this illness are common. You are near certain animals or the places they live. You are older  than 47 years of age. What are the signs or symptoms? Symptoms of this condition include: A cough. A fever. Sweating or chills. Chest pain, often when you breathe deeply or cough. Breathing problems, such as: Fast breathing. Trouble breathing. Shortness of breath. Feeling tired (fatigued). Muscle aches. How is this treated? Treatment for this condition depends on many things, such as: The cause of your illness. Your medicines. Your other health problems. Most adults can be treated at home. Sometimes, treatment must happen in a hospital. Treatment may include medicines to kill germs. Medicines may depend on which germ caused your illness. Very bad pneumonia is rare. If you get it, you may: Have a machine to help you breathe. Have fluid taken away from around your lungs. Follow these instructions at home: Medicines Take over-the-counter and prescription medicines only as told by your doctor. Take cough medicine only if you are losing sleep. Cough medicine can keep your body from taking mucus away from your lungs. If you were prescribed an antibiotic medicine, take it as told by your doctor. Do not stop taking the antibiotic even if you start to feel better. Lifestyle   Do not drink alcohol. Do not use any products that contain nicotine or tobacco, such as cigarettes, e-cigarettes, and chewing tobacco. If you need help quitting, ask your doctor. Eat a healthy diet. This includes a lot of vegetables, fruits, whole grains, low-fat dairy products, and low-fat (lean) protein. General instructions  Rest a lot. Sleep for at least 8 hours each night. Sleep with your head and neck raised. Put a few pillows under your head or sleep in a reclining chair. Return to your normal activities as told by your doctor. Ask your doctor what activities are safe for you. Drink enough fluid to keep your pee (urine) pale yellow. If your throat is sore, rinse your mouth often with salt water. To make  salt water, dissolve -1 tsp (3-6 g) of salt in 1 cup (237 mL) of warm water. Keep all follow-up visits as told by your doctor. This is important. How is this prevented? You can lower your risk of pneumonia by: Getting the pneumonia shot (vaccine). These shots have different types and schedules. Ask your doctor what works best for you. Think about getting this shot if: You are older than 47 years of age. You are 36-12 years of age and: You are being treated for cancer. You have long-term lung disease. You have other problems that affect your body's defense system. Ask your doctor if you have one of these. Getting your flu shot every year. Ask your doctor which type of shot is best for you. Going to the dentist as often as told. Washing your hands often with soap and water for at least 20 seconds. If you cannot use soap and water, use hand sanitizer. Contact a doctor  if: You have a fever. You lose sleep because your cough medicine does not help. Get help right away if: You are short of breath and this gets worse. You have more chest pain. Your sickness gets worse. This is very serious if: You are an older adult. Your body's defense system is weak. You cough up blood. These symptoms may be an emergency. Do not wait to see if the symptoms will go away. Get medical help right away. Call your local emergency services (911 in the [place] Do not drive yourself to the hospital. Summary Pneumonia is an infection of the lungs. Community-acquired pneumonia affects people who have not been in the hospital. Certain germs can cause this infection. This condition may be treated with medicines that kill germs. For very bad pneumonia, you may need a hospital stay and treatment to help with breathing. This information is not intended to replace advice given to you by your health care provider. Make sure you discuss any questions you have with your health care provider. Document Revised: 09/12/2019  Document Reviewed: 09/12/2019 Elsevier Patient Education  2022 [employer].    If you have been instructed to have an in-person evaluation today at a local Urgent Care facility, please use the link below. It will take you to a list of all of our available [facility], including address, phone number and hours of operation. Please do not delay care.  [facility]  If you or a family member do not have a primary care provider, use the link below to schedule a visit and establish care. When you choose a [facility] primary care physician or advanced practice provider, you gain a long-term partner in health. Find a Primary Care Provider  Learn more about [facility] in-office and virtual care options: [facility]

## 2021-11-28 NOTE — Progress Notes (Signed)
Virtual Visit Consent   [name], you are scheduled for a virtual visit with a [facility] provider today.     Just as with appointments in the office, your consent must be obtained to participate.  Your consent will be active for this visit and any virtual visit you may have with one of our providers in the next 365 days.     If you have a MyChart account, a copy of this consent can be sent to you electronically.  All virtual visits are billed to your insurance company just like a traditional visit in the office.    As this is a virtual visit, video technology does not allow for your provider to perform a traditional examination.  This may limit your provider's ability to fully assess your condition.  If your provider identifies any concerns that need to be evaluated in person or the need to arrange testing (such as labs, EKG, etc.), we will make arrangements to do so.     Although advances in technology are sophisticated, we cannot ensure that it will always work on either your end or our end.  If the connection with a video visit is poor, the visit may have to be switched to a telephone visit.  With either a video or telephone visit, we are not always able to ensure that we have a secure connection.     I need to obtain your verbal consent now.   Are you willing to proceed with your visit today?    [name] has provided verbal consent on 11/28/2021 for a virtual visit (video or telephone).   Mar Daring, PA-C   Date: 11/28/2021 1:54 PM   Virtual Visit via Video Note   I, Mar Daring, connected with  [name]  ([id number], 12-Aug-1976) on 11/28/21 at  1:30 PM EST by a video-enabled telemedicine application and verified that I am speaking with the correct person using two identifiers.  Location: Patient: Virtual Visit Location Patient: Home Provider: Virtual Visit Location Provider: Home Office   I discussed the limitations of evaluation and  management by telemedicine and the availability of in person appointments. The patient expressed understanding and agreed to proceed.    History of Present Illness: [name] is a 45 y.o. who identifies as a female who was assigned female at birth, and is being seen today for continued symptoms. Diagnosed with pneumonia on 11/19/21. Was placed on azithromycin. Completed treatment and has had no improvements. Continues to have coughing, shortness of breath with coughing, myalgias, mucus is thick enough to choke her, fevers intermittently.  Has been trying Mucinex sinus max, breathe easy, afrin. Nothing has been helping. Having headache with Mucinex sinus max.   Problems:  Patient Active Problem List   Diagnosis Date Noted   Systemic viral illness 11/13/2012   Morbid obesity with BMI of 40.0-44.9, adult ([place]) 11/13/2012   Tachycardia 11/13/2012   Rheumatoid arthritis ([place])    Asthma     Allergies:  Allergies  Allergen Reactions   Bee Venom Anaphylaxis   Doxycycline Nausea And Vomiting   Diclofenac Swelling   Metoclopramide Anxiety and Rash    "never give to me again"   Tramadol Anxiety, Rash and Other (See Comments)    Reaction: "felt like I was about to lose my mind"    Medications:  Current Outpatient Medications:    levofloxacin (LEVAQUIN) 500 MG tablet, Take 1 tablet (500 mg total) by mouth daily for 7  days., Disp: 7 tablet, Rfl: 0   predniSONE (DELTASONE) 20 MG tablet, Take 2 tablets (40 mg total) by mouth daily with breakfast., Disp: 10 tablet, Rfl: 0   albuterol (PROVENTIL HFA;VENTOLIN HFA) 108 (90 Base) MCG/ACT inhaler, Inhale 1-2 puffs into the lungs every 4 (four) hours as needed for wheezing or shortness of breath., Disp: 1 Inhaler, Rfl: 0   BELBUCA 300 MCG FILM, Take 1 Film by mouth daily., Disp: , Rfl:    furosemide (LASIX) 20 MG tablet, Take 1 tablet (20 mg total) by mouth daily as needed for edema. (Patient not taking: Reported on 07/23/2021), Disp: 30 tablet, Rfl:  1   guaiFENesin (ROBITUSSIN) 100 MG/5ML liquid, Take 5 mLs by mouth every 4 (four) hours as needed for cough or to loosen phlegm., Disp: , Rfl:    LORazepam (ATIVAN) 1 MG tablet, Take 1 mg by mouth every 8 (eight) hours as needed for anxiety or sleep., Disp: , Rfl:    naloxone (NARCAN) nasal spray 4 mg/0.1 mL, 1 spray as needed (overdose)., Disp: , Rfl:    pregabalin (LYRICA) 50 MG capsule, Take 50 mg by mouth at bedtime., Disp: , Rfl:    tiZANidine (ZANAFLEX) 4 MG tablet, Take 4 mg by mouth every 6 (six) hours as needed for muscle spasms., Disp: , Rfl:   Observations/Objective: Patient is well-developed, well-nourished in no acute distress.  Patient appears ill Resting comfortably at home.  Head is normocephalic, atraumatic.  No labored breathing.  Speech is clear and coherent with logical content.  Patient is alert and oriented at baseline.    Assessment and Plan: 1. Pneumonia of left lower lobe due to infectious organism - levofloxacin (LEVAQUIN) 500 MG tablet; Take 1 tablet (500 mg total) by mouth daily for 7 days.  Dispense: 7 tablet; Refill: 0 - predniSONE (DELTASONE) 20 MG tablet; Take 2 tablets (40 mg total) by mouth daily with breakfast.  Dispense: 10 tablet; Refill: 0  - Symptoms not improved following Z-pack for LLL pnuemonia - Will prescribe levaquin and prednisone - Push fluids - Rest - Strict return precautions and ER precautions discussed - F/U PCP in 2 weeks to confirm improving and needs repeat imaging in 4-6 weeks to confirm resolution of pneumonia  Follow Up Instructions: I discussed the assessment and treatment plan with the patient. The patient was provided an opportunity to ask questions and all were answered. The patient agreed with the plan and demonstrated an understanding of the instructions.  A copy of instructions were sent to the patient via MyChart unless otherwise noted below.    The patient was advised to call back or seek an in-person evaluation if  the symptoms worsen or if the condition fails to improve as anticipated.  Time:  I spent 17 minutes with the patient via telehealth technology discussing the above problems/concerns.    Mar Daring, PA-C

## 2022-04-06 ENCOUNTER — Telehealth: Payer: BC Managed Care – PPO | Admitting: Physician Assistant

## 2022-04-06 DIAGNOSIS — K0889 Other specified disorders of teeth and supporting structures: Secondary | ICD-10-CM

## 2022-04-06 DIAGNOSIS — F4329 Adjustment disorder with other symptoms: Secondary | ICD-10-CM

## 2022-04-06 DIAGNOSIS — R1030 Lower abdominal pain, unspecified: Secondary | ICD-10-CM

## 2022-04-06 DIAGNOSIS — K047 Periapical abscess without sinus: Secondary | ICD-10-CM | POA: Diagnosis not present

## 2022-04-06 DIAGNOSIS — Z90710 Acquired absence of both cervix and uterus: Secondary | ICD-10-CM

## 2022-04-06 DIAGNOSIS — Z90721 Acquired absence of ovaries, unilateral: Secondary | ICD-10-CM

## 2022-04-06 DIAGNOSIS — N939 Abnormal uterine and vaginal bleeding, unspecified: Secondary | ICD-10-CM

## 2022-04-06 MED ORDER — AMOXICILLIN 500 MG PO CAPS: 500.0000 mg | ORAL_CAPSULE | Freq: Three times a day (TID) | ORAL | 0 refills | Status: AC

## 2022-04-06 MED ORDER — NAPROXEN 500 MG PO TABS: 500.0000 mg | ORAL_TABLET | Freq: Two times a day (BID) | ORAL | 0 refills | Status: DC

## 2022-04-06 NOTE — Progress Notes (Signed)
?Virtual Visit Consent  ? ?Barrington Ellison, you are scheduled for a virtual visit with a [facility] provider today.   ?  ?Just as with appointments in the office, your consent must be obtained to participate.  Your consent will be active for this visit and any virtual visit you may have with one of our providers in the next 365 days.   ?  ?If you have a MyChart account, a copy of this consent can be sent to you electronically.  All virtual visits are billed to your insurance company just like a traditional visit in the office.   ? ?As this is a virtual visit, video technology does not allow for your provider to perform a traditional examination.  This may limit your provider's ability to fully assess your condition.  If your provider identifies any concerns that need to be evaluated in person or the need to arrange testing (such as labs, EKG, etc.), we will make arrangements to do so.   ?  ?Although advances in technology are sophisticated, we cannot ensure that it will always work on either your end or our end.  If the connection with a video visit is poor, the visit may have to be switched to a telephone visit.  With either a video or telephone visit, we are not always able to ensure that we have a secure connection.    ? ?Also, by engaging in this virtual visit, you consent to the provision of healthcare. Additionally, you authorize for your insurance to be billed (if applicable) for the services provided during this visit.  ? ?I need to obtain your verbal consent now.   Are you willing to proceed with your visit today?  ?  ?[name] has provided verbal consent on 04/06/2022 for a virtual visit (video or telephone). ?  ?Mar Daring, PA-C  ? ?Date: 04/06/2022 5:53 PM ? ? ?Virtual Visit via Video Note  ? ?IMar Daring, connected with  [name]  ([id number], August 25, 1976) on 04/06/22 at  4:15 PM EDT by a video-enabled telemedicine application and verified that I am speaking with the  correct person using two identifiers. ? ?Location: ?Patient: Virtual Visit Location Patient: Home ?Provider: Virtual Visit Location Provider: Home office ?  ?I discussed the limitations of evaluation and management by telemedicine and the availability of in person appointments. The patient expressed understanding and agreed to proceed.   ? ?History of Present Illness: ?[name] is a 46 y.o. who identifies as a female who was assigned female at birth, and is being seen today for multiple issues. ? ?Dental pain: Left sided dental pain shooting pain through left ear. Took Amoxicillin x 2 pills and Hydrocodone already without relief.  ? ?Mental Health: Mother is in Hospice. Sister is HCPOA and is not letting her see her mother. She is wanting to get FMLA to be able to take time to be with her mother. Daughter is pregnant and having cardiac issues. Has a lot of stress and works a lot. Feels like she just needs a break. ? ?Vaginal bleeding/lower abdominal pain: Had hysterectomy (total, including ovaries and fallopian tubes) at 24. Still has sharp, lower abdominal pain that can stop her in her tracks. Has been told possible scar tissue. Does have history of vaginal bleeding within the last year, noted on TP when wiping. Also has urine frequency, urine urgency, and stress incontinence.  ? ? ?Problems:  ?Patient Active Problem List  ? Diagnosis Date  Noted  ? Systemic viral illness 11/13/2012  ? Morbid obesity with BMI of 40.0-44.9, adult ([place]) 11/13/2012  ? Tachycardia 11/13/2012  ? Rheumatoid arthritis ([place])   ? Asthma   ?  ?Allergies:  ?Allergies  ?Allergen Reactions  ? Bee Venom Anaphylaxis  ? Doxycycline Nausea And Vomiting  ? Diclofenac Swelling  ? Metoclopramide Anxiety and Rash  ?  "never give to me again"  ? Tramadol Anxiety, Rash and Other (See Comments)  ?  Reaction: "felt like I was about to lose my mind"   ? ?Medications:  ?Current Outpatient Medications:  ?  amoxicillin (AMOXIL) 500 MG capsule, Take 1  capsule (500 mg total) by mouth 3 (three) times daily for 10 days., Disp: 30 capsule, Rfl: 0 ?  naproxen (NAPROSYN) 500 MG tablet, Take 1 tablet (500 mg total) by mouth 2 (two) times daily with a meal., Disp: 30 tablet, Rfl: 0 ? ?Observations/Objective: ?Patient is well-developed, well-nourished in no acute distress.  ?Resting comfortably at home.  ?Head is normocephalic, atraumatic.  ?No labored breathing.  ?Speech is clear and coherent with logical content.  ?Patient is alert and oriented at baseline.  ? ? ?Assessment and Plan: ?1. Pain, dental ?- naproxen (NAPROSYN) 500 MG tablet; Take 1 tablet (500 mg total) by mouth 2 (two) times daily with a meal.  Dispense: 30 tablet; Refill: 0 ?- amoxicillin (AMOXIL) 500 MG capsule; Take 1 capsule (500 mg total) by mouth 3 (three) times daily for 10 days.  Dispense: 30 capsule; Refill: 0 ? ?2. Dental infection ?- amoxicillin (AMOXIL) 500 MG capsule; Take 1 capsule (500 mg total) by mouth 3 (three) times daily for 10 days.  Dispense: 30 capsule; Refill: 0 ? ?3. Stress and adjustment reaction ? ?4. Lower abdominal pain ? ?5. Vaginal bleeding ? ?6. S/P hysterectomy with oophorectomy ? ?- Amoxicillin and naproxen sent in for dental pain and possible infection ?- Advised to get dental putty OTC to put on broken teeth to avoid nerve root exposure ?- Sent dental information (she has been calling but they either do not take her insurance or have no upcoming available appointments) ?- Has a lot of emotional stress with family and work, mental health resources provided for counseling and coping ?- Continues to have lower abd pain and has had vaginal bleeding in the past; follow up with GYN; resources provided ?- Also, interested in HRT, can also discuss with GYN ?- Establish with PCP, link in AVS discussed for patient to schedule ?- If symptoms worsen before she can establish, seek urgent evaluation in person ? ?Follow Up Instructions: ?I discussed the assessment and treatment plan  with the patient. The patient was provided an opportunity to ask questions and all were answered. The patient agreed with the plan and demonstrated an understanding of the instructions.  A copy of instructions were sent to the patient via MyChart unless otherwise noted below.  ? ?The patient was advised to call back or seek an in-person evaluation if the symptoms worsen or if the condition fails to improve as anticipated. ? ?Time:  ?I spent 42 minutes with the patient via telehealth technology discussing the above problems/concerns.   ? ?Mar Daring, PA-C ?

## 2022-04-06 NOTE — Patient Instructions (Addendum)
?[name], thank you for joining Mar Daring, PA-C for today's virtual visit.  While this provider is not your primary care provider (PCP), if your PCP is located in our provider database this encounter information will be shared with them immediately following your visit. ? ?Consent: ?(Patient) ALLINA RICHES provided verbal consent for this virtual visit at the beginning of the encounter. ? ?Current Medications: ?No current outpatient medications on file.  ? ?Medications ordered in this encounter:  ?No orders of the defined types were placed in this encounter. ?  ? ?*If you need refills on other medications prior to your next appointment, please contact your pharmacy* ? ?Follow-Up: ?Call back or seek an in-person evaluation if the symptoms worsen or if the condition fails to improve as anticipated. ? ?Other Instructions ? ?Dentek Temparin Advanced repair kit ?Dentemp Loose cap and lost filling repair ? ?Mindfulness Training/Deep Breathing/Guided Meditations: ?- Consider downloading one of the following apps as they each deal with walking you through these techniques -- Calm, HeadSpace, [place] ?- There are also a lot of great books on these subjects. Here are a few good ones to consider: ?          * Mindfulness for Beginners by Lynn Ito ?          * Practicing Mindfulness by Maurie Boettcher ?          * Breath WORK by Gene Smithson ?  ?National Suicide Prevention Lifeline ?1-800-273-TALK 636 617 3694) ?http://www.webster.biz/ ??988?-Mental Health Crisis Line ? ?[place] ?913-711-1011 ?nimhinfo'@nih' .gov (e-mail) ?https://carter.com/ ? ?Local Resources: ?Outpatient:  ? ?[facility]:  ?[phone] ?[place], [place]  [address] ?  ?[email or web address]://[email or web address]/ ? ?[place]  ?Outpatient Behavioral [facility] at [place] ?[address] ?[place], [place] [address] ?[phone] ? ?[place]  ?Outpatient Behavioral Health at [facility] ?[address] ?[place], [place]  [address] ?[phone] ?Local Resources ?(Inpatient) ? ? ?[facility] ?[address]  ?[place], [place] [address] ?[phone] ? ?[facility] ?Behavioral Medicine Unit and Geriatric Psychiatric Unit   ?[address]  ?[place], [place] [address] ?[phone] ? ? ?[place] ?TastyShow.cz ? ?[place] ?https://[email or web address]/ ? ?[employer] and Support Resources: ? ?[facility] Homelessness ?PainGain.tn ? ?[place] ?https://www.interactiveresourcecenter.org/ ? ?[employer] ?https://www.greensborourbanministry.org/ ? ?[facility] [employer] (adult men?s shelter) ?www.ViralSquad.com.cy ?[address] ?[place], [place] [address] ?[phone] ?[facility] [place] ?[address] [place], [place] [address] ?[phone] ? ? ?[place] ?[phone] VET [phone]) ? ?[employer] ?[phone] press 1 ?Confidential chat-VeteransCrisisLine.net ?Or Text to [phone] ? ?[place] [address] ?Call 211 or [phone] ?www.DealerOdds.hu ? ?Affordable Housing Resources in [place].com   ?(312)777-3947 ? ?Shelters ? ?[place] ?[phone] ?8:30am-5:30pm ?[facility] [facility] ?[place], [place]  [address] ?[phone] ?Female veterans 18+ with substance abuse issues ?Eligibility:  By Referral Only ? ?[place] ?[address] ?[place], [place] [address] ?[phone] Ext. [phone] ?Adult Men & Women ?Eligibility: Valid ID & Social Security Card ?www.greensborourbanministry.org ? ?[facility] -  Vet Safety Net ?[place], [place]  [address] ?[phone] ?Female veterans 18+ with substance abuse issues ?Eligibility:  By Referral Only ? ?[facility] [place] ?[address] ?[place], [place]  [address] ?  [phone] ?Single women 18+ without dependents ?Open 6pm-8am ?Eligibility:  Valid ID & Social Security Card ?Call to check availability  ?http://westendministries.org/leslieshouse.aspx ? ?[facility] ?[address] ?[place], [place]  [address] ?[phone] ?Female veterans 18+ with substance abuse/mental ?health issues ?Eligibility:  By Referral Only ? ?[facility] ?[place] ?[place], [place] [address] ?[phone] ?Call to check availability ?Males 18+ ?Eligibility: Valid ID & Social Security Card ?www.odm-hp.org ? ?[facility] [employer] ?[address] ?[place], [place] [address] ?[phone] ?Women 18+ & Families with children ?Eligibility:  Valid ID & Criminal Background Check ?BattleCheck.dk ? ? ?  ?[facility] [place] (Care Management): [phone] ?The [employer]: [place] 24 Hour Phone Line [phone] ?[place] [phone] ?[place] [phone] ? ?2-1-1 Referral Service ?United Way 211 ?Call 211 or ([phone])486-4376 ?www.DealerOdds.hu ?A free [employer], 24/7 telephone information and referral service to help link citizens who are seeking help with the community resources they need. In [place], [place], [place] and [place] counties, just dial 211 from your phone. ? ?Mental Health Association in [place] ?Support groups for anxiety, depression and bipolar disorder, schizophrenia, family and friends, aftermath of suicide, and mental wellness for Latinos (in [place]). ? ?Mental Health Association in [place] ?Offers support groups, [employer] offers. Psychological, vocational, educational and other rehabilitation  services to those who suffer from mental health illness of the 76 and up (5 days a week/5hours a day), offer out patient services like diagnostic evaluation, comprehensive clinical assessments, individual counseling, group therapy, psycho-educational workshops, referral to other specialists, referral to a psychiatrist for an evaluation for medication, consultation, and outreach/training. ? ?ADS (Alcohol and Drug Services) ?(336) Y3330987 762-259-3989 ?Substance Abuse education, prevention and treatment (detox, assessments, intensive outpatient and inpatient counseling and programs). ? ?[employer] ?[place] (336) [phone], [place] (336) F6855624 ?Support groups for posttraumatic stress, depression, and schizophrenia? as well as day programs for individuals with severe mental illness. ? ?[place] [phone] ? ?[facility]-FREE-[phone] ? ?[employer] 719-014-6257 or www.kellinfoundation.org ?Telehealth platform, Individual counseling across the lifespan for both mental health and substance use, support groups, advocacy, case management, virtual villages, resource coordination. ? ?[facility]7051681184 ? ?[facility]-FREE-507-010-7124 or [phone] ?[phone]. Provides mental health services to all residents regardless of ability to pay. [address] [place]. [place]. 24 ? ?Domestic Violence Crisis Line ?Family Service of the [place] ?Call for shelter and/or safety planning ?[place] ?[phone] (24/7) ?[facility] ?([phone] (24/7) ? ?[place] ?([phone] ? ?[employer] ?[phone] press 1 ?Confidential chat-VeteransCrisisLine.net ?Or Text to [phone] ? ?[facility] ?[address] ?[place], [place] [address] ?[phone] ?Hours: Mon-Fri. 8am-5pm ?[facility] Mobile Crisis Management ?Mobile crisis response for mental health, substance abuse or intellectual/developmental  disabilities ?([phone] ? ?Disclaimer: This resource list is subject to change at any time and is a starting point for resource identification as of 12/12/2021.  ? ?  ?Take Care! ? ?OB/GYN Facilities: ?*Center for [facility]

## 2022-05-16 ENCOUNTER — Emergency Department (HOSPITAL_COMMUNITY)
Admission: EM | Admit: 2022-05-16 | Discharge: 2022-05-17 | Disposition: A | Discharge status: Home / Self Care | Payer: BC Managed Care – PPO | Attending: Emergency Medicine | Admitting: Emergency Medicine

## 2022-05-16 ENCOUNTER — Other Ambulatory Visit: Payer: Self-pay

## 2022-05-16 ENCOUNTER — Encounter (HOSPITAL_COMMUNITY): Payer: Self-pay

## 2022-05-16 DIAGNOSIS — R109 Unspecified abdominal pain: Secondary | ICD-10-CM | POA: Diagnosis not present

## 2022-05-16 DIAGNOSIS — K047 Periapical abscess without sinus: Secondary | ICD-10-CM | POA: Diagnosis not present

## 2022-05-16 DIAGNOSIS — J45909 Unspecified asthma, uncomplicated: Secondary | ICD-10-CM | POA: Insufficient documentation

## 2022-05-16 NOTE — ED Triage Notes (Signed)
Patient having right sided abdominal pain for 1 month. It comes and goes. She said it feels like it is swelling. Also complaining of left tooth pain, she needs it surgically removed. [name] it is making her mouth swell.

## 2022-05-17 LAB — CBC WITH DIFFERENTIAL/PLATELET
Abs Immature Granulocytes: 0.01 10*3/uL (ref 0.00–0.07)
Basophils Absolute: 0 10*3/uL (ref 0.0–0.1)
Basophils Relative: 0 %
Eosinophils Absolute: 0.1 10*3/uL (ref 0.0–0.5)
Eosinophils Relative: 1 %
HCT: 39.2 % (ref 36.0–46.0)
Hemoglobin: 12.4 g/dL (ref 12.0–15.0)
Immature Granulocytes: 0 %
Lymphocytes Relative: 19 %
Lymphs Abs: 1 10*3/uL (ref 0.7–4.0)
MCH: 29.2 pg (ref 26.0–34.0)
MCHC: 31.6 g/dL (ref 30.0–36.0)
MCV: 92.2 fL (ref 80.0–100.0)
Monocytes Absolute: 0.4 10*3/uL (ref 0.1–1.0)
Monocytes Relative: 8 %
Neutro Abs: 3.6 10*3/uL (ref 1.7–7.7)
Neutrophils Relative %: 72 %
Platelets: 241 10*3/uL (ref 150–400)
RBC: 4.25 MIL/uL (ref 3.87–5.11)
RDW: 13.6 % (ref 11.5–15.5)
WBC: 5 10*3/uL (ref 4.0–10.5)
nRBC: 0 % (ref 0.0–0.2)

## 2022-05-17 LAB — COMPREHENSIVE METABOLIC PANEL
ALT: 14 U/L (ref 0–44)
AST: 12 U/L — ABNORMAL LOW (ref 15–41)
Albumin: 3.6 g/dL (ref 3.5–5.0)
Alkaline Phosphatase: 80 U/L (ref 38–126)
Anion gap: 7 (ref 5–15)
BUN: 11 mg/dL (ref 6–20)
CO2: 27 mmol/L (ref 22–32)
Calcium: 9 mg/dL (ref 8.9–10.3)
Chloride: 106 mmol/L (ref 98–111)
Creatinine, Ser: 0.64 mg/dL (ref 0.44–1.00)
GFR, Estimated: >60 mL/min (ref >60)
Glucose, Bld: 96 mg/dL (ref 70–99)
Potassium: 3.8 mmol/L (ref 3.5–5.1)
Sodium: 140 mmol/L (ref 135–145)
Total Bilirubin: 0.8 mg/dL (ref 0.3–1.2)
Total Protein: 7.4 g/dL (ref 6.5–8.1)

## 2022-05-17 LAB — URINALYSIS, ROUTINE W REFLEX MICROSCOPIC
Bilirubin Urine: NEGATIVE
Glucose, UA: NEGATIVE mg/dL
Hgb urine dipstick: NEGATIVE
Ketones, ur: NEGATIVE mg/dL
Leukocytes,Ua: NEGATIVE
Nitrite: NEGATIVE
Protein, ur: NEGATIVE mg/dL
Specific Gravity, Urine: 1.027 (ref 1.005–1.030)
pH: 5 (ref 5.0–8.0)

## 2022-05-17 LAB — LIPASE, BLOOD: Lipase: 24 U/L (ref 11–51)

## 2022-05-17 MED ORDER — PENICILLIN V POTASSIUM 500 MG PO TABS: 500.0000 mg | ORAL_TABLET | Freq: Four times a day (QID) | ORAL | 0 refills | Status: AC

## 2022-05-17 NOTE — Discharge Instructions (Addendum)
Take the prescribed medication as directed. Follow-up with oral surgery-- call and try to schedule appt.  I have attached information for some of local offices. Return to the ED for new or worsening symptoms.

## 2022-05-17 NOTE — ED Provider Notes (Signed)
[facility] DEPT Provider Note   CSN: [id number] Arrival date & time: 05/16/22  2342     History  Chief Complaint  Patient presents with   Abdominal Pain    [name] is a 46 y.o. female.  The history is provided by the patient and medical records.  Abdominal Pain  46 y.o. F with history of rheumatoid arthritis, obesity, asthma, presenting to the ED with right-sided abdominal pain intermittently for the past month.  States symptoms come and go, no real alleviating or exacerbating factors.  States she is eating and drinking fine, she has not had any vomiting.  She does have loose stools but does have history of IBS so this is not abnormal for her.  She denies any fever.  No urinary symptoms or pelvic pain.  She is status post hysterectomy, cholecystectomy, appendectomy.  Also reports bad left lower tooth.  Saw dentist recently had had 2 right lower teeth extracted, but was told 1 on left required an oral surgeon.  She has not yet found a Psychologist, sport and exercise in the area.  States some facial swelling to left side of face.  No difficulty swallowing.  Home Medications Prior to Admission medications   Medication Sig Start Date End Date Taking? Authorizing Provider  HYDROcodone-acetaminophen (NORCO/VICODIN) 5-325 MG tablet Take 1 tablet by mouth 3 (three) times daily as needed for moderate pain. 04/29/22  Yes [provider]  tiZANidine (ZANAFLEX) 4 MG tablet Take 4 mg by mouth 3 (three) times daily as needed for muscle spasms. 04/28/22  Yes [provider]  naproxen (NAPROSYN) 500 MG tablet Take 1 tablet (500 mg total) by mouth 2 (two) times daily with a meal. Patient not taking: Reported on 05/17/2022 04/06/22   Mar Daring, PA-C      Allergies    Bee venom, Doxycycline, Diclofenac, Metoclopramide, and Tramadol    Review of Systems   Review of Systems  Gastrointestinal:  Positive for abdominal pain.  All other systems reviewed and are  negative.  Physical Exam Updated Vital Signs BP (!) 158/97 (BP Location: Right Arm)   Pulse 83   Temp 98.2 F (36.8 C) (Oral)   Resp 16   Ht '5\' 5"'$  (1.651 m)   Wt 123.4 kg   SpO2 100%   BMI 45.26 kg/m   Physical Exam Vitals and nursing note reviewed.  Constitutional:      Appearance: She is well-developed.  HENT:     Head: Normocephalic and atraumatic.     Mouth/Throat:     Comments: Teeth largely in fair dentition,  broken left lower premolar, surrounding gingiva swollen without discrete abscess, minor swelling of left lower cheek without extension into neck; no overlying skin changes or erythema, handling secretions well, no stridor Eyes:     Conjunctiva/sclera: Conjunctivae normal.     Pupils: Pupils are equal, round, and reactive to light.  Cardiovascular:     Rate and Rhythm: Normal rate and regular rhythm.     Heart sounds: Normal heart sounds.  Pulmonary:     Effort: Pulmonary effort is normal.     Breath sounds: Normal breath sounds.  Abdominal:     General: Bowel sounds are normal.     Palpations: Abdomen is soft.     Tenderness: There is no abdominal tenderness. There is no rebound.     Comments: Abdomen is obese but non-tender to palpation, normal bowel sounds, no appreciable distention  Musculoskeletal:        General:  Normal range of motion.     Cervical back: Normal range of motion.  Skin:    General: Skin is warm and dry.  Neurological:     Mental Status: She is alert and oriented to person, place, and time.    ED Results / Procedures / Treatments   Labs (all labs ordered are listed, but only abnormal results are displayed) Labs Reviewed  COMPREHENSIVE METABOLIC PANEL - Abnormal; Notable for the following components:      Result Value   AST 12 (*)    All other components within normal limits  CBC WITH DIFFERENTIAL/PLATELET  LIPASE, BLOOD  URINALYSIS, ROUTINE W REFLEX MICROSCOPIC    EKG None  Radiology No results  found.  Procedures Procedures    Medications Ordered in ED Medications - No data to display  ED Course/ Medical Decision Making/ A&P                           Medical Decision Making Amount and/or Complexity of Data Reviewed Labs: ordered.  Risk Prescription drug management.   46 year old female here with 1 month of intermittent abdominal pain.  No alleviating or exacerbating factors.  States on her right side.  She is status post appendectomy, cholecystectomy, and hysterectomy.  She is afebrile, nontoxic.  Her abdomen is obese but soft and nontender.  She has normal bowel sounds.  No appreciable distention.  Does report loose stools, however has history of IBS with not atypical for her.  Will check labs.  Also reports dental infection.  Has broken left lower tooth, dentist has been unable to extract this and told she needed oral surgeon.  She has not been able to find anyone here locally due to out-of-pocket cost.  She does have broken left lower premolar with surrounding gingival swelling but no discrete abscess.  Some mild swelling of left lower cheek but no extension to the neck.  She is handling her secretions well, normal phonation without stridor.  Not clinically concerning for Ludwig's angina.  2:22 AM On reassessment patient is resting comfortably.  VS remain stable RA.  Reviewed labs with her which are all reassuring-- normal WBC, no anemia, no electrolyte derangement.  Normal lipase.  UA without signs of infection.  Given reassuring labs and benign abdominal exam, do not feel she needs further work-up with advanced imaging such as CT.  In regards to her dental pain/swelling, does appear to have some developing infection so we will start her on antibiotics and refer to oral surgery as she was told her regular dentist could not handle this.  She can return here for any new or acute changes.  Final Clinical Impression(s) / ED Diagnoses Final diagnoses:  Abdominal pain,  unspecified abdominal location  Dental infection    Rx / DC Orders ED Discharge Orders          Ordered    penicillin v potassium (VEETID) 500 MG tablet  4 times daily        05/17/22 0219              Larene Pickett, PA-C 05/17/22 0226    Quintella Reichert, MD 05/17/22 [id number]

## 2022-05-17 NOTE — ED Notes (Signed)
Discharge paperwork reviewed and given to pt. Pt. Verbalized understanding of paperwork and signed discharge forms. Pt. Left ED alone.

## 2022-08-13 ENCOUNTER — Emergency Department (HOSPITAL_COMMUNITY)
Admission: EM | Admit: 2022-08-13 | Discharge: 2022-08-14 | Disposition: A | Discharge status: Home / Self Care | Payer: BC Managed Care – PPO | Attending: Emergency Medicine | Admitting: Emergency Medicine

## 2022-08-13 ENCOUNTER — Other Ambulatory Visit: Payer: Self-pay

## 2022-08-13 ENCOUNTER — Emergency Department (HOSPITAL_COMMUNITY): Payer: BC Managed Care – PPO

## 2022-08-13 ENCOUNTER — Encounter (HOSPITAL_COMMUNITY): Payer: Self-pay

## 2022-08-13 DIAGNOSIS — R079 Chest pain, unspecified: Secondary | ICD-10-CM

## 2022-08-13 DIAGNOSIS — R0789 Other chest pain: Secondary | ICD-10-CM | POA: Diagnosis present

## 2022-08-13 DIAGNOSIS — J45909 Unspecified asthma, uncomplicated: Secondary | ICD-10-CM | POA: Insufficient documentation

## 2022-08-13 DIAGNOSIS — R0602 Shortness of breath: Secondary | ICD-10-CM | POA: Insufficient documentation

## 2022-08-13 DIAGNOSIS — R600 Localized edema: Secondary | ICD-10-CM | POA: Diagnosis not present

## 2022-08-13 DIAGNOSIS — M7989 Other specified soft tissue disorders: Secondary | ICD-10-CM | POA: Diagnosis not present

## 2022-08-13 LAB — BASIC METABOLIC PANEL
Anion gap: 5 (ref 5–15)
BUN: 10 mg/dL (ref 6–20)
CO2: 25 mmol/L (ref 22–32)
Calcium: 9.5 mg/dL (ref 8.9–10.3)
Chloride: 109 mmol/L (ref 98–111)
Creatinine, Ser: 0.68 mg/dL (ref 0.44–1.00)
GFR, Estimated: >60 mL/min (ref >60)
Glucose, Bld: 107 mg/dL — ABNORMAL HIGH (ref 70–99)
Potassium: 3.8 mmol/L (ref 3.5–5.1)
Sodium: 139 mmol/L (ref 135–145)

## 2022-08-13 LAB — CBC
HCT: 37.8 % (ref 36.0–46.0)
Hemoglobin: 12.3 g/dL (ref 12.0–15.0)
MCH: 29.8 pg (ref 26.0–34.0)
MCHC: 32.5 g/dL (ref 30.0–36.0)
MCV: 91.5 fL (ref 80.0–100.0)
Platelets: 261 10*3/uL (ref 150–400)
RBC: 4.13 MIL/uL (ref 3.87–5.11)
RDW: 13.5 % (ref 11.5–15.5)
WBC: 5.1 10*3/uL (ref 4.0–10.5)
nRBC: 0 % (ref 0.0–0.2)

## 2022-08-13 LAB — TROPONIN I (HIGH SENSITIVITY): Troponin I (High Sensitivity): <2 ng/L (ref <18)

## 2022-08-13 LAB — I-STAT BETA HCG BLOOD, ED (MC, WL, AP ONLY): I-stat hCG, quantitative: <5 m[IU]/mL (ref <5)

## 2022-08-13 NOTE — ED Provider Notes (Signed)
[facility] DEPT Provider Note   CSN: [id number] Arrival date & time: 08/13/22  1924     History  Chief Complaint  Patient presents with   Chest Pain   Shortness of Breath    [name] is a 46 y.o. female with a hx of prior DVT, asthma, and IBS who presents to the emergency department with complaints of chest discomfort and shortness of breath over the past few weeks.  Patient reports that she has fairly persistent chest tightness to the central chest with some intermittent sharp pains that last a couple minutes at a time however can be very severe.  Pain is worse with deep breathing and she gets short of breath with this.  Given severity of the pain today she decided to come to the emergency department to get checked out.  She has had problems with swelling in her bilateral legs that waxes and wanes for several months, she also notes that she was told she had a pulmonary nodule in December of last year that she has not had checked out as she has been under a great deal of stress.  Her mother was recently ill and passed away.  She denies fever, hemoptysis, personal history of cancer, vomiting, or syncope.    HPI     Home Medications Prior to Admission medications   Medication Sig Start Date End Date Taking? Authorizing Provider  HYDROcodone-acetaminophen (NORCO/VICODIN) 5-325 MG tablet Take 1 tablet by mouth 3 (three) times daily as needed for moderate pain. 04/29/22   [provider]  naproxen (NAPROSYN) 500 MG tablet Take 1 tablet (500 mg total) by mouth 2 (two) times daily with a meal. Patient not taking: Reported on 05/17/2022 04/06/22   Mar Daring, PA-C  tiZANidine (ZANAFLEX) 4 MG tablet Take 4 mg by mouth 3 (three) times daily as needed for muscle spasms. 04/28/22   [provider]      Allergies    Bee venom, Doxycycline, Diclofenac, Metoclopramide, and Tramadol    Review of Systems   Review of Systems   Constitutional:  Negative for fever.  Respiratory:  Positive for chest tightness and shortness of breath. Negative for wheezing.   Cardiovascular:  Positive for chest pain and leg swelling.  Gastrointestinal:  Negative for abdominal pain and vomiting.  Neurological:  Negative for syncope.  Psychiatric/Behavioral:         Positive for increased stress.  All other systems reviewed and are negative.   Physical Exam Updated Vital Signs BP (!) 178/111 (BP Location: Left Arm)   Pulse 70   Temp 98.4 F (36.9 C) (Oral)   Resp 20   Ht '5\' 5"'$  (1.651 m)   Wt 124.7 kg   SpO2 100%   BMI 45.76 kg/m  Physical Exam Vitals and nursing note reviewed.  Constitutional:      General: She is not in acute distress.    Appearance: She is well-developed. She is not toxic-appearing.  HENT:     Head: Normocephalic and atraumatic.  Eyes:     General:        Right eye: No discharge.        Left eye: No discharge.     Conjunctiva/sclera: Conjunctivae normal.  Cardiovascular:     Rate and Rhythm: Normal rate and regular rhythm.  Pulmonary:     Effort: No respiratory distress.     Breath sounds: Normal breath sounds. No wheezing, rhonchi or rales.  Chest:     Chest  wall: Tenderness present.  Abdominal:     General: There is no distension.     Palpations: Abdomen is soft.     Tenderness: There is no abdominal tenderness.  Musculoskeletal:     Cervical back: Neck supple.     Right lower leg: Edema present.     Left lower leg: Edema present.     Comments: Symmetric edema.  No calf tenderness.  Skin:    General: Skin is warm and dry.  Neurological:     Mental Status: She is alert.     Comments: Clear speech.   Psychiatric:        Behavior: Behavior normal.     ED Results / Procedures / Treatments   Labs (all labs ordered are listed, but only abnormal results are displayed) Labs Reviewed  BASIC METABOLIC PANEL - Abnormal; Notable for the following components:      Result Value    Glucose, Bld 107 (*)    All other components within normal limits  CBC  I-STAT BETA HCG BLOOD, ED ([facility], [facility], [facility] ONLY)  TROPONIN I (HIGH SENSITIVITY)  TROPONIN I (HIGH SENSITIVITY)    EKG None  Radiology CT Angio Chest PE W/Cm &/Or Wo Cm  Result Date: 08/14/2022 CLINICAL DATA:  Chest pain and shortness of breath EXAM: CT ANGIOGRAPHY CHEST WITH CONTRAST TECHNIQUE: Multidetector CT imaging of the chest was performed using the standard protocol during bolus administration of intravenous contrast. Multiplanar CT image reconstructions and MIPs were obtained to evaluate the vascular anatomy. RADIATION DOSE REDUCTION: This exam was performed according to the departmental dose-optimization program which includes automated exposure control, adjustment of the mA and/or kV according to patient size and/or use of iterative reconstruction technique. CONTRAST:  127m OMNIPAQUE IOHEXOL 350 MG/ML SOLN COMPARISON:  Chest x-ray from the previous day. FINDINGS: Cardiovascular: Thoracic aorta and its branches are within normal limits. No aneurysmal dilatation is seen. No cardiac enlargement is noted. No dissection is seen. The pulmonary artery shows a normal branching pattern bilaterally. No focal filling defect to suggest pulmonary embolism is noted. Mediastinum/Nodes: Thoracic inlet is within normal limits. No hilar or mediastinal adenopathy is noted. Esophagus is within normal limits. Lungs/Pleura: Lungs are well aerated bilaterally. No focal infiltrate or effusion is seen. No parenchymal nodule is noted. Upper Abdomen: Visualized upper abdomen shows no acute abnormality. Musculoskeletal: Degenerative changes of the thoracic spine are noted. No acute rib abnormality is seen. Review of the MIP images confirms the above findings. IMPRESSION: No evidence of pulmonary emboli. No acute abnormality seen. Electronically Signed   By: MInez CatalinaM.D.   On: 08/14/2022 01:45   DG Chest 2 View  Result Date: 08/13/2022 CLINICAL  DATA:  Chest pain and shortness of breath. EXAM: CHEST - 2 VIEW COMPARISON:  Chest x-ray 11/19/2021 FINDINGS: The heart size and mediastinal contours are within normal limits. Both lungs are clear. The visualized skeletal structures are unremarkable. IMPRESSION: No active cardiopulmonary disease. Electronically Signed   By: ARonney AstersM.D.   On: 08/13/2022 19:58    Procedures Procedures    Medications Ordered in ED Medications  lidocaine (LIDODERM) 5 % 1 patch (1 patch Transdermal Patch Applied 08/14/22 0308)  sodium chloride (PF) 0.9 % injection (  Given by Other 08/14/22 0149)  iohexol (OMNIPAQUE) 350 MG/ML injection 100 mL (100 mLs Intravenous Contrast Given 08/14/22 0133)  ipratropium-albuterol (DUONEB) 0.5-2.5 (3) MG/3ML nebulizer solution 3 mL (3 mLs Nebulization Given 08/14/22 0308)  ketorolac (TORADOL) 15 MG/ML injection 15 mg (15  mg Intravenous Given 08/14/22 0308)  HYDROmorphone (DILAUDID) injection 0.5 mg (0.5 mg Intravenous Given 08/14/22 0405)    ED Course/ Medical Decision Making/ A&P                           Medical Decision Making Amount and/or Complexity of Data Reviewed Labs: ordered. Radiology: ordered.  Risk Prescription drug management.   Patient presents to the emergency department with chest pain. Patient nontoxic appearing, in no apparent distress, vitals without significant abnormality with the exception of elevated BP.   DDX including but not limited to: ACS, pulmonary embolism, dissection, pneumothorax, pneumonia, arrhythmia, severe anemia, CHF, asthma exacerbation, MSK, GERD, anxiety, abdominal process.   Additional history obtained:  Chart & nursing note reviewed.   EKG: No STEMI  Lab Tests:  I reviewed & interpreted labs including:  CBC: Unremarkable BMP: Unremarkable Pregnancy test: Negative Troponin:Flat BNP: WNL  Imaging Studies ordered:  I ordered and viewed the following imaging, agree with radiologist impression:  CT angio of the chest: No  evidence of pulmonary emboli. No acute abnormality seen.   ED Course:  Heart Pathway Score low risk- EKG not consistent w/ STEMI, delta troponin negative, doubt ACS. Pain is not a tearing sensation, symmetric pulses, no widening of mediastinum on imaging, doubt dissection. CTA negative for PE.  CTA also negative for pulmonary edema, pneumonia, pneumothorax, or other acute abnormality.  Additionally no nodule noted on CT.  Labs without critical anemia or electrolyte derangement.  Patient without significant improvement status post DuoNeb, feel that asthma exacerbation is less likely and she agrees.  She was given additional analgesics with some improvement.  Unclear definitive etiology to patient's pain, however given overall reassuring work-up feel she is reasonable for discharge.  Will provide short course of NSAIDs, she has tolerated naproxen well previously, her allergy to diclofenac was stomach upset-no anaphylaxis.  Will provide referral to cardiology. I discussed results, treatment plan, need for  follow-up, and return precautions with the patient. Provided opportunity for questions, patient confirmed understanding and is in agreement with plan.   This is a shared visit with supervising physician Dr. Stark Jock who has independently evaluated patient & provided guidance in evaluation/management/disposition, in agreement with care    Portions of this note were generated with Dragon dictation software. Dictation errors may occur despite best attempts at proofreading.   Final Clinical Impression(s) / ED Diagnoses Final diagnoses:  Chest pain, unspecified type    Rx / DC Orders ED Discharge Orders          Ordered    naproxen (NAPROSYN) 500 MG tablet  2 times daily PRN        08/14/22 0500    Ambulatory referral to Cardiology        08/14/22 0500    lidocaine (LIDODERM) 5 %  Daily PRN        08/14/22 0500              Michel Eskelson R, PA-C 08/14/22 0700    Veryl Speak,  MD 08/14/22 2357

## 2022-08-13 NOTE — ED Triage Notes (Signed)
Pt reports with chest pain and SHOB for a few weeks. Pt states that her mom died in June 20, 2023 and she has been stressed out. Pt reports havi\ng a lack of appetite and low energy.

## 2022-08-13 NOTE — ED Provider Triage Note (Signed)
Emergency Medicine Provider Triage Evaluation Note  [name] , a 46 y.o. female  was evaluated in triage.  Pt complains of shortness of breath. States that she has had worsening shortness of breath since her mother passed in June. States she intermittently feels short of breath and has been having chest pain. Describes it as pressure. Non radiating. No vomiting or diaphoresis. She currently is having chest pressure. .  Review of Systems  Positive:  Negative: See above   Physical Exam  BP (!) 186/105 (BP Location: Left Arm)   Pulse 74   Temp 98.2 F (36.8 C) (Oral)   Resp 16   Ht '5\' 5"'$  (1.651 m)   Wt 124.7 kg   SpO2 100%   BMI 45.76 kg/m  Gen:   Awake, no distress   Resp:  Normal effort  MSK:   Moves extremities without difficulty  Other:  Lungs clear, S1/S2 without murmur  Medical Decision Making  Medically screening exam initiated at 7:39 PM.  Appropriate orders placed.  [name] was informed that the remainder of the evaluation will be completed by another provider, this initial triage assessment does not replace that evaluation, and the importance of remaining in the ED until their evaluation is complete.     Meghan Hillier, PA-C 08/13/22 1944

## 2022-08-14 ENCOUNTER — Encounter (HOSPITAL_COMMUNITY): Payer: Self-pay

## 2022-08-14 ENCOUNTER — Emergency Department (HOSPITAL_COMMUNITY): Payer: BC Managed Care – PPO

## 2022-08-14 LAB — TROPONIN I (HIGH SENSITIVITY): Troponin I (High Sensitivity): 2 ng/L (ref <18)

## 2022-08-14 LAB — BRAIN NATRIURETIC PEPTIDE: B Natriuretic Peptide: 23 pg/mL (ref 0.0–100.0)

## 2022-08-14 MED ORDER — SODIUM CHLORIDE (PF) 0.9 % IJ SOLN
INTRAMUSCULAR | Status: AC
  Filled 2022-08-14: qty 50

## 2022-08-14 MED ORDER — LIDOCAINE 5 % EX PTCH
1.0000 | MEDICATED_PATCH | CUTANEOUS | Status: DC
Start: 2022-08-14 — End: 2022-08-14
  Administered 2022-08-14: 1 via TRANSDERMAL
  Filled 2022-08-14: qty 1

## 2022-08-14 MED ORDER — IPRATROPIUM-ALBUTEROL 0.5-2.5 (3) MG/3ML IN SOLN
3.0000 mL | Freq: Once | RESPIRATORY_TRACT | Status: AC
  Administered 2022-08-14: 3 mL via RESPIRATORY_TRACT
  Filled 2022-08-14: qty 3

## 2022-08-14 MED ORDER — KETOROLAC TROMETHAMINE 15 MG/ML IJ SOLN
15.0000 mg | Freq: Once | INTRAMUSCULAR | Status: AC
  Administered 2022-08-14: 15 mg via INTRAVENOUS
  Filled 2022-08-14: qty 1

## 2022-08-14 MED ORDER — LIDOCAINE 5 % EX PTCH: 1.0000 | MEDICATED_PATCH | Freq: Every day | CUTANEOUS | 0 refills | Status: AC | PRN

## 2022-08-14 MED ORDER — IOHEXOL 350 MG/ML SOLN
100.0000 mL | Freq: Once | INTRAVENOUS | Status: AC | PRN
  Administered 2022-08-14: 100 mL via INTRAVENOUS

## 2022-08-14 MED ORDER — HYDROMORPHONE HCL 2 MG/ML IJ SOLN
0.5000 mg | Freq: Once | INTRAMUSCULAR | Status: AC
  Administered 2022-08-14: 0.5 mg via INTRAVENOUS
  Filled 2022-08-14: qty 1

## 2022-08-14 MED ORDER — NAPROXEN 500 MG PO TABS: 500.0000 mg | ORAL_TABLET | Freq: Two times a day (BID) | ORAL | 0 refills | Status: AC | PRN

## 2022-08-14 NOTE — Discharge Instructions (Addendum)
You were seen in the emergency department today for chest pain. Your work-up in the emergency department has been overall reassuring. Your labs have been fairly normal and or similar to previous blood work you have had done. Your EKG and the enzyme we use to check your heart did not show an acute heart attack at this time. Your chest x-ray was normal. Your CT scan did not show a blood clot or nodule. We are sending you home with naproxen to help with pain.   - Naproxen- this is a nonsteroidal anti-inflammatory medication that will help with pain and swelling. Be sure to take this medication as prescribed with food, 1 pill every 12 hours,  It should be taken with food, as it can cause stomach upset, and more seriously, stomach bleeding. Do not take other nonsteroidal anti-inflammatory medications with this such as Advil, Motrin, Aleve, Mobic, Goodie Powder, or Motrin etc..    - Lidoderm patch- Apply 1 patch to your area of most significant pain once per day to help numb/soothe this area. Remove & discard patch within 12 hours of application.   You make take Tylenol per over the counter dosing with these medications.   We have prescribed you new medication(s) today. Discuss the medications prescribed today with your pharmacist as they can have adverse effects and interactions with your other medicines including over the counter and prescribed medications. Seek medical evaluation if you start to experience new or abnormal symptoms after taking one of these medicines, seek care immediately if you start to experience difficulty breathing, feeling of your throat closing, facial swelling, or rash as these could be indications of a more serious allergic reaction   We would like you to follow up closely with your primary care provider and/or the cardiologist provided in your discharge instructions within 1-3 days. Return to the ER immediately should you experience any new or worsening symptoms including but not  limited to return of pain, worsened pain, vomiting, shortness of breath, dizziness, lightheadedness, passing out, or any other concerns that you may have.

## 2022-09-15 ENCOUNTER — Other Ambulatory Visit: Payer: Self-pay | Admitting: Nurse Practitioner

## 2022-09-15 DIAGNOSIS — M4727 Other spondylosis with radiculopathy, lumbosacral region: Secondary | ICD-10-CM

## 2022-10-03 ENCOUNTER — Ambulatory Visit
Admission: RE | Admit: 2022-10-03 | Discharge: 2022-10-03 | Disposition: A | Discharge status: Home / Self Care | Payer: BC Managed Care – PPO | Source: Ambulatory Visit | Attending: Nurse Practitioner | Admitting: Nurse Practitioner

## 2022-10-03 DIAGNOSIS — M4727 Other spondylosis with radiculopathy, lumbosacral region: Secondary | ICD-10-CM

## 2022-12-15 ENCOUNTER — Telehealth: Payer: BC Managed Care – PPO | Admitting: Family Medicine

## 2022-12-15 DIAGNOSIS — S91319A Laceration without foreign body, unspecified foot, initial encounter: Secondary | ICD-10-CM | POA: Diagnosis not present

## 2022-12-15 MED ORDER — MUPIROCIN 2 % EX OINT: 1.0000 | TOPICAL_OINTMENT | Freq: Two times a day (BID) | CUTANEOUS | 0 refills | Status: AC

## 2022-12-15 NOTE — Patient Instructions (Addendum)
  Barrington Ellison, thank you for joining Perlie Mayo, NP for today's virtual visit.  While this provider is not your primary care provider (PCP), if your PCP is located in our provider database this encounter information will be shared with them immediately following your visit.   [facility] account gives you access to today's visit and all your visits, tests, and labs performed at [facility] " click here if you don't have a [facility] account or go to [email or web address]  Consent: (Patient) [name] provided verbal consent for this virtual visit at the beginning of the encounter.  Current Medications:  Current Outpatient Medications:    mupirocin ointment (BACTROBAN) 2 %, Apply 1 Application topically 2 (two) times daily., Disp: 22 g, Rfl: 0   HYDROcodone-acetaminophen (NORCO/VICODIN) 5-325 MG tablet, Take 1 tablet by mouth 3 (three) times daily as needed for moderate pain., Disp: , Rfl:    lidocaine (LIDODERM) 5 %, Place 1 patch onto the skin daily as needed. Apply patch to area most significant pain once per day.  Remove and discard patch within 12 hours of application., Disp: 15 patch, Rfl: 0   naproxen (NAPROSYN) 500 MG tablet, Take 1 tablet (500 mg total) by mouth 2 (two) times daily as needed for moderate pain., Disp: 15 tablet, Rfl: 0   tiZANidine (ZANAFLEX) 4 MG tablet, Take 4 mg by mouth 3 (three) times daily as needed for muscle spasms., Disp: , Rfl:    Medications ordered in this encounter:  Meds ordered this encounter  Medications   mupirocin ointment (BACTROBAN) 2 %    Sig: Apply 1 Application topically 2 (two) times daily.    Dispense:  22 g    Refill:  0    Order Specific Question:   Supervising Provider    Answer:   Chase Picket [id number]     *If you need refills on other medications prior to your next appointment, please contact your pharmacy*  Follow-Up: Call back or seek an in-person evaluation if the symptoms  worsen or if the condition fails to improve as anticipated.  [place] [phone]  Other Instructions  -use ointment as directed -keep foot dry and clean -work note in [place]  -please get the tetanus shot updated today      If you have been instructed to have an in-person evaluation today at a local Urgent Care facility, please use the link below. It will take you to a list of all of our available [facility], including address, phone number and hours of operation. Please do not delay care.  [facility]  If you or a family member do not have a primary care provider, use the link below to schedule a visit and establish care. When you choose a [facility] primary care physician or advanced practice provider, you gain a long-term partner in health. Find a Primary Care Provider  Learn more about 's in-office and virtual care options: [facility]

## 2022-12-15 NOTE — Progress Notes (Signed)
Virtual Visit Consent   [name], you are scheduled for a virtual visit with a [facility] provider today. Just as with appointments in the office, your consent must be obtained to participate. Your consent will be active for this visit and any virtual visit you may have with one of our providers in the next 365 days. If you have a MyChart account, a copy of this consent can be sent to you electronically.  As this is a virtual visit, video technology does not allow for your provider to perform a traditional examination. This may limit your provider's ability to fully assess your condition. If your provider identifies any concerns that need to be evaluated in person or the need to arrange testing (such as labs, EKG, etc.), we will make arrangements to do so. Although advances in technology are sophisticated, we cannot ensure that it will always work on either your end or our end. If the connection with a video visit is poor, the visit may have to be switched to a telephone visit. With either a video or telephone visit, we are not always able to ensure that we have a secure connection.  By engaging in this virtual visit, you consent to the provision of healthcare and authorize for your insurance to be billed (if applicable) for the services provided during this visit. Depending on your insurance coverage, you may receive a charge related to this service.  I need to obtain your verbal consent now. Are you willing to proceed with your visit today? [name] has provided verbal consent on 12/15/2022 for a virtual visit (video or telephone). Meghan Mayo, NP  Date: 12/15/2022 12:28 PM  Virtual Visit via Video Note   I, [name], connected with  [name]  ([id number], 1976-07-14) on 12/15/22 at 12:30 PM EST by a video-enabled telemedicine application and verified that I am speaking with the correct person using two identifiers.  Location: Patient: Virtual Visit Location  Patient: Home Provider: Virtual Visit Location Provider: Home Office   I discussed the limitations of evaluation and management by telemedicine and the availability of in person appointments. The patient expressed understanding and agreed to proceed.    History of Present Illness: [name] is a 47 y.o. who identifies as a female who was assigned female at birth, and is being seen today for cutting right foot with a foot care tool- callus tool. Did not realize how much it cut into her foot- as she could not see the area well while using tool. The area that was cut is from the middle arch area to the heel. She has it wrapped in a bandage to protect it. Has been apply clean gauze and neosporin.   Problems:  Patient Active Problem List   Diagnosis Date Noted   Systemic viral illness 11/13/2012   Morbid obesity with BMI of 40.0-44.9, adult ([place]) 11/13/2012   Tachycardia 11/13/2012   Rheumatoid arthritis ([place])    Asthma     Allergies:  Allergies  Allergen Reactions   Bee Venom Anaphylaxis   Doxycycline Nausea And Vomiting   Diclofenac Swelling   Metoclopramide Anxiety and Rash    "Made me feel like I was going crazy"   Tramadol Anxiety, Rash and Other (See Comments)    Reaction: "felt like I was about to lose my mind"     Medications:  Current Outpatient Medications:    HYDROcodone-acetaminophen (NORCO/VICODIN) 5-325 MG tablet, Take 1 tablet by mouth 3 (three)  times daily as needed for moderate pain., Disp: , Rfl:    lidocaine (LIDODERM) 5 %, Place 1 patch onto the skin daily as needed. Apply patch to area most significant pain once per day.  Remove and discard patch within 12 hours of application., Disp: 15 patch, Rfl: 0   naproxen (NAPROSYN) 500 MG tablet, Take 1 tablet (500 mg total) by mouth 2 (two) times daily as needed for moderate pain., Disp: 15 tablet, Rfl: 0   tiZANidine (ZANAFLEX) 4 MG tablet, Take 4 mg by mouth 3 (three) times daily as needed for muscle spasms.,  Disp: , Rfl:   Observations/Objective: Patient is well-developed, well-nourished in no acute distress.  Resting comfortably  at home.  Head is normocephalic, atraumatic.  No labored breathing.  Speech is clear and coherent with logical content.  Patient is alert and oriented at baseline.  Right heel, arch and side of foot is cut and raw tissue  Assessment and Plan:  1. Cut of sole of foot  - mupirocin ointment (BACTROBAN) 2 %; Apply 1 Application topically 2 (two) times daily.  Dispense: 22 g; Refill: 0   -keep the area dry and clean -apply ointment as directed  -get updated tetanus at local pharmacy   Reviewed side effects, risks and benefits of medication.    Patient acknowledged agreement and understanding of the plan.   Past Medical, Surgical, Social History, Allergies, and Medications have been Reviewed.    Follow Up Instructions: I discussed the assessment and treatment plan with the patient. The patient was provided an opportunity to ask questions and all were answered. The patient agreed with the plan and demonstrated an understanding of the instructions.  A copy of instructions were sent to the patient via MyChart unless otherwise noted below.     The patient was advised to call back or seek an in-person evaluation if the symptoms worsen or if the condition fails to improve as anticipated.  Time:  I spent 10 minutes with the patient via telehealth technology discussing the above problems/concerns.    Meghan Mayo, NP

## 2023-02-11 ENCOUNTER — Other Ambulatory Visit: Payer: Self-pay

## 2023-02-11 ENCOUNTER — Emergency Department (HOSPITAL_COMMUNITY)
Admission: EM | Admit: 2023-02-11 | Discharge: 2023-02-12 | Disposition: A | Discharge status: Home / Self Care | Payer: BC Managed Care – PPO | Attending: Emergency Medicine | Admitting: Emergency Medicine

## 2023-02-11 ENCOUNTER — Emergency Department (HOSPITAL_COMMUNITY): Payer: BC Managed Care – PPO

## 2023-02-11 DIAGNOSIS — R197 Diarrhea, unspecified: Secondary | ICD-10-CM | POA: Insufficient documentation

## 2023-02-11 DIAGNOSIS — J45909 Unspecified asthma, uncomplicated: Secondary | ICD-10-CM | POA: Diagnosis not present

## 2023-02-11 DIAGNOSIS — M546 Pain in thoracic spine: Secondary | ICD-10-CM | POA: Diagnosis not present

## 2023-02-11 DIAGNOSIS — R079 Chest pain, unspecified: Secondary | ICD-10-CM | POA: Diagnosis present

## 2023-02-11 DIAGNOSIS — R112 Nausea with vomiting, unspecified: Secondary | ICD-10-CM | POA: Diagnosis not present

## 2023-02-11 DIAGNOSIS — M549 Dorsalgia, unspecified: Secondary | ICD-10-CM

## 2023-02-11 LAB — COMPREHENSIVE METABOLIC PANEL
ALT: 19 U/L (ref 0–44)
AST: 17 U/L (ref 15–41)
Albumin: 3.5 g/dL (ref 3.5–5.0)
Alkaline Phosphatase: 86 U/L (ref 38–126)
Anion gap: 12 (ref 5–15)
BUN: 8 mg/dL (ref 6–20)
CO2: 24 mmol/L (ref 22–32)
Calcium: 9.2 mg/dL (ref 8.9–10.3)
Chloride: 103 mmol/L (ref 98–111)
Creatinine, Ser: 0.82 mg/dL (ref 0.44–1.00)
GFR, Estimated: >60 mL/min (ref >60)
Glucose, Bld: 94 mg/dL (ref 70–99)
Potassium: 3.7 mmol/L (ref 3.5–5.1)
Sodium: 139 mmol/L (ref 135–145)
Total Bilirubin: 0.9 mg/dL (ref 0.3–1.2)
Total Protein: 7.7 g/dL (ref 6.5–8.1)

## 2023-02-11 LAB — CBC WITH DIFFERENTIAL/PLATELET
Abs Immature Granulocytes: 0.02 10*3/uL (ref 0.00–0.07)
Basophils Absolute: 0 10*3/uL (ref 0.0–0.1)
Basophils Relative: 0 %
Eosinophils Absolute: 0.1 10*3/uL (ref 0.0–0.5)
Eosinophils Relative: 1 %
HCT: 42.7 % (ref 36.0–46.0)
Hemoglobin: 13.4 g/dL (ref 12.0–15.0)
Immature Granulocytes: 0 %
Lymphocytes Relative: 28 %
Lymphs Abs: 1.4 10*3/uL (ref 0.7–4.0)
MCH: 28.8 pg (ref 26.0–34.0)
MCHC: 31.4 g/dL (ref 30.0–36.0)
MCV: 91.8 fL (ref 80.0–100.0)
Monocytes Absolute: 0.4 10*3/uL (ref 0.1–1.0)
Monocytes Relative: 8 %
Neutro Abs: 3.1 10*3/uL (ref 1.7–7.7)
Neutrophils Relative %: 63 %
Platelets: 300 10*3/uL (ref 150–400)
RBC: 4.65 MIL/uL (ref 3.87–5.11)
RDW: 13.1 % (ref 11.5–15.5)
WBC: 4.9 10*3/uL (ref 4.0–10.5)
nRBC: 0 % (ref 0.0–0.2)

## 2023-02-11 LAB — TROPONIN I (HIGH SENSITIVITY)
Troponin I (High Sensitivity): 3 ng/L (ref <18)
Troponin I (High Sensitivity): <2 ng/L (ref <18)

## 2023-02-11 LAB — MAGNESIUM: Magnesium: 2 mg/dL (ref 1.7–2.4)

## 2023-02-11 MED ORDER — SODIUM CHLORIDE 0.9 % IV BOLUS
1000.0000 mL | Freq: Once | INTRAVENOUS | Status: AC
  Administered 2023-02-12: 1000 mL via INTRAVENOUS

## 2023-02-11 MED ORDER — ONDANSETRON HCL 4 MG/2ML IJ SOLN
4.0000 mg | Freq: Once | INTRAMUSCULAR | Status: AC
  Administered 2023-02-12: 4 mg via INTRAVENOUS
  Filled 2023-02-11: qty 2

## 2023-02-11 NOTE — ED Provider Notes (Signed)
[facility] Provider Note   CSN: [id number] Arrival date & time: 02/11/23  1904     History  Chief Complaint  Patient presents with   Chest Pain    [name] is a 47 y.o. female.  47 year old female with past medical history of asthma, rheumatoid arthritis, IBS, degenerative disc disease presents with complaint of vomiting and diarrhea for the past 3 days now with complaint of pain in her upper back that radiates down to her lower back with tightness in her chest.  Feels like she is dehydrated.  No known sick contacts.  Feels like she had a fever earlier as she was very hot and had to open the windows in the house and is normally very cold natured.        Home Medications Prior to Admission medications   Medication Sig Start Date End Date Taking? Authorizing Provider  HYDROcodone-acetaminophen (NORCO/VICODIN) 5-325 MG tablet Take 1 tablet by mouth 3 (three) times daily as needed for moderate pain. 04/29/22   [provider]  lidocaine (LIDODERM) 5 % Place 1 patch onto the skin daily as needed. Apply patch to area most significant pain once per day.  Remove and discard patch within 12 hours of application. 08/14/22   Petrucelli, Aldona Bar R, PA-C  mupirocin ointment (BACTROBAN) 2 % Apply 1 Application topically 2 (two) times daily. 12/15/22   Perlie Mayo, NP  naproxen (NAPROSYN) 500 MG tablet Take 1 tablet (500 mg total) by mouth 2 (two) times daily as needed for moderate pain. 08/14/22   Petrucelli, Samantha R, PA-C  ondansetron (ZOFRAN-ODT) 4 MG disintegrating tablet Take 1 tablet (4 mg total) by mouth every 8 (eight) hours as needed for nausea or vomiting. 02/12/23  Yes Tacy Learn, PA-C  tiZANidine (ZANAFLEX) 4 MG tablet Take 4 mg by mouth 3 (three) times daily as needed for muscle spasms. 04/28/22   [provider]      Allergies    Bee venom, Doxycycline, Diclofenac, Metoclopramide, and Tramadol    Review  of Systems   Review of Systems Negative except as per HPI Physical Exam Updated Vital Signs BP (!) 136/54   Pulse (!) 105   Temp 98.3 F (36.8 C) (Oral)   Resp 18   SpO2 (!) 83%  Physical Exam Vitals and nursing note reviewed.  Constitutional:      General: She is not in acute distress.    Appearance: She is well-developed. She is obese. She is not diaphoretic.  HENT:     Head: Normocephalic and atraumatic.     Mouth/Throat:     Mouth: Mucous membranes are dry.  Cardiovascular:     Rate and Rhythm: Normal rate and regular rhythm.     Pulses: Normal pulses.     Heart sounds: Normal heart sounds.  Pulmonary:     Effort: Pulmonary effort is normal.     Breath sounds: Normal breath sounds.  Chest:     Chest wall: No tenderness.  Abdominal:     Palpations: Abdomen is soft.     Tenderness: There is no abdominal tenderness. There is no guarding or rebound.  Musculoskeletal:     Cervical back: Neck supple. No tenderness or bony tenderness.     Thoracic back: No tenderness or bony tenderness.     Lumbar back: No tenderness or bony tenderness.     Right lower leg: No tenderness. No edema.     Left lower leg: No  tenderness. No edema.  Skin:    General: Skin is warm and dry.     Findings: No erythema or rash.  Neurological:     Mental Status: She is alert and oriented to person, place, and time.  Psychiatric:        Behavior: Behavior normal.     ED Results / Procedures / Treatments   Labs (all labs ordered are listed, but only abnormal results are displayed) Labs Reviewed  COMPREHENSIVE METABOLIC PANEL  CBC WITH DIFFERENTIAL/PLATELET  MAGNESIUM  TROPONIN I (HIGH SENSITIVITY)  TROPONIN I (HIGH SENSITIVITY)    EKG EKG Interpretation  Date/Time:  Thursday February 11 2023 19:18:33 EST Ventricular Rate:  85 PR Interval:  160 QRS Duration: 96 QT Interval:  360 QTC Calculation: 428 R Axis:   7 Text Interpretation: Normal sinus rhythm with sinus arrhythmia  Incomplete right bundle branch block Nonspecific T wave abnormality Abnormal ECG No significant change since last tracing Confirmed by Ripley Fraise [id number]) on 02/12/2023 12:03:57 AM  Radiology DG Chest 2 View  Result Date: 02/11/2023 CLINICAL DATA:  Chest pain EXAM: CHEST - 2 VIEW COMPARISON:  X-ray and CT 08/13/2022 and older FINDINGS: No consolidation, pneumothorax or effusion. Normal cardiopericardial silhouette without edema. Degenerative changes of the spine. Surgical clips are seen in the upper abdomen. IMPRESSION: No acute cardiopulmonary disease Electronically Signed   By: Jill Side M.D.   On: 02/11/2023 20:12    Procedures Procedures    Medications Ordered in ED Medications  ondansetron (ZOFRAN-ODT) disintegrating tablet 4 mg (4 mg Oral Not Given 02/12/23 0108)  sodium chloride 0.9 % bolus 1,000 mL (0 mLs Intravenous Stopped 02/12/23 0217)  ondansetron (ZOFRAN) injection 4 mg (4 mg Intravenous Given 02/12/23 0107)    ED Course/ Medical Decision Making/ A&P                             Medical Decision Making Risk Prescription drug management.   This patient presents to the ED for concern of n/v/d, chest tightness, back pain, this involves an extensive number of treatment options, and is a complaint that carries with it a high risk of complications and morbidity.  The differential diagnosis includes but not limited to gastroenteritis, metabolic disturbance, ACS, PNA, COPD/asthma exacerbation   Co morbidities that complicate the patient evaluation  Rheumatoid arthritis, asthma   Additional history obtained:  External records from outside source obtained and reviewed including prior labs and imaging on file for comparison   Lab Tests:  I Ordered, and personally interpreted labs.  The pertinent results include: Labs reassuring and within normal meds including CBC, CMP, magnesium, troponin x 2   Imaging Studies ordered:  I ordered imaging studies including chest  x-ray I independently visualized and interpreted imaging which showed no acute abnormality I agree with the radiologist interpretation   Cardiac Monitoring: / EKG:  The patient was maintained on a cardiac monitor.  I personally viewed and interpreted the cardiac monitored which showed an underlying rhythm of: normal sinus rhythm, rate 85   Consultations Obtained:  I requested consultation with the ER attending, Dr. Christy Gentles,  and discussed lab and imaging findings as well as pertinent plan - they recommend: reviewed EKG, no change from prior on file   Problem List / ED Course / Critical interventions / Medication management  47 year old female presents with concern as above.  On exam, is well-appearing, nontoxic in no distress.  Abdomen is soft nontender, lungs clear  to auscultation, heart rate regular rate rhythm with O2 sat 100% on room air.  Workup today is reassuring.  Patient would like IV fluids that she feels dehydrated.  She is provided with IV Zofran and IV fluids.  No further vomiting.  Vitals remained stable throughout her stay in the emergency department. Final vitals entered with O2 sat of 83%, suspect erroneous recording as O2 sat was 100% with clear CXR. I ordered medication including zofran, fluids  for nausea, feeling dehydrated   Reevaluation of the patient after these medicines showed that the patient improved I have reviewed the patients home medicines and have made adjustments as needed   Social Determinants of Health:  Has PCP   Test / Admission - Considered:  Stable for dc home to hydrate and recheck with PCP         Final Clinical Impression(s) / ED Diagnoses Final diagnoses:  Nonspecific chest pain  Acute back pain, unspecified back location, unspecified back pain laterality  Nausea and vomiting, unspecified vomiting type  Diarrhea, unspecified type    Rx / DC Orders ED Discharge Orders          Ordered    ondansetron (ZOFRAN-ODT) 4 MG  disintegrating tablet  Every 8 hours PRN        02/12/23 0037              Tacy Learn, PA-C 02/12/23 0306    Ripley Fraise, MD 02/12/23 [phone]

## 2023-02-11 NOTE — ED Provider Triage Note (Signed)
Emergency Medicine Provider Triage Evaluation Note  [name] , a 47 y.o. female  was evaluated in triage.  Pt complains of chest pain.  Patient reports several days of chest pain typically worsened by coughing.  No prior cardiac history.  She also reports that she has had multiple episodes of vomiting and diarrhea over the last 2 days.  No known sick contacts.  Difficulty maintaining any liquid or food in her stomach.  Has been able to manage some Powerade.  Reports that she is only urinated maybe 1 time and was earlier today.  Review of Systems  Positive: As above Negative: As above  Physical Exam  BP (!) 152/96 (BP Location: Right Arm)   Pulse 91   Temp 98.2 F (36.8 C) (Oral)   Resp 18   SpO2 100%  Gen:   Awake, no distress   Resp:  Normal effort  MSK:   Moves extremities without difficulty  Other:  No tenderness to palpation of abdomen  Medical Decision Making  Medically screening exam initiated at 7:48 PM.  Appropriate orders placed.  [name] was informed that the remainder of the evaluation will be completed by another provider, this initial triage assessment does not replace that evaluation, and the importance of remaining in the ED until their evaluation is complete.     Meghan Heller, PA-C 02/11/23 1948

## 2023-02-11 NOTE — ED Notes (Signed)
Attempted IV x2, with no success

## 2023-02-11 NOTE — ED Triage Notes (Signed)
Patient reports central chest pain with mild SOB onset yesterday , emesis with diarrhea 3 days ago , pain radiating to upper back .

## 2023-02-11 NOTE — ED Provider Notes (Incomplete)
[facility] Provider Note   CSN: [id number] Arrival date & time: 02/11/23  1904     History {Add pertinent medical, surgical, social history, OB history to HPI:1} Chief Complaint  Patient presents with  . Chest Pain    [name] is a 47 y.o. female.  47 year old female with past medical history of asthma, rheumatoid arthritis, IBS, degenerative disc disease presents with complaint of vomiting and diarrhea for the past 3 days now with complaint of pain in her upper back that radiates down to her lower back with tightness in her chest.  Feels like she is dehydrated.  No known sick contacts.  Feels like she had a fever earlier as she was very hot and had to open the windows in the house and is normally very cold natured.       Home Medications Prior to Admission medications   Medication Sig Start Date End Date Taking? Authorizing Provider  HYDROcodone-acetaminophen (NORCO/VICODIN) 5-325 MG tablet Take 1 tablet by mouth 3 (three) times daily as needed for moderate pain. 04/29/22   [provider]  lidocaine (LIDODERM) 5 % Place 1 patch onto the skin daily as needed. Apply patch to area most significant pain once per day.  Remove and discard patch within 12 hours of application. 08/14/22   Petrucelli, Aldona Bar R, PA-C  mupirocin ointment (BACTROBAN) 2 % Apply 1 Application topically 2 (two) times daily. 12/15/22   Perlie Mayo, NP  naproxen (NAPROSYN) 500 MG tablet Take 1 tablet (500 mg total) by mouth 2 (two) times daily as needed for moderate pain. 08/14/22   Petrucelli, Samantha R, PA-C  tiZANidine (ZANAFLEX) 4 MG tablet Take 4 mg by mouth 3 (three) times daily as needed for muscle spasms. 04/28/22   [provider]      Allergies    Bee venom, Doxycycline, Diclofenac, Metoclopramide, and Tramadol    Review of Systems   Review of Systems Negative except as per HPI Physical Exam Updated Vital Signs BP (!) 153/96    Pulse 75   Temp 98.3 F (36.8 C) (Oral)   Resp 19   SpO2 100%  Physical Exam Vitals and nursing note reviewed.  Constitutional:      General: She is not in acute distress.    Appearance: She is well-developed. She is obese. She is not diaphoretic.  HENT:     Head: Normocephalic and atraumatic.     Mouth/Throat:     Mouth: Mucous membranes are dry.  Cardiovascular:     Rate and Rhythm: Normal rate and regular rhythm.     Pulses: Normal pulses.     Heart sounds: Normal heart sounds.  Pulmonary:     Effort: Pulmonary effort is normal.     Breath sounds: Normal breath sounds.  Chest:     Chest wall: No tenderness.  Abdominal:     Palpations: Abdomen is soft.     Tenderness: There is no abdominal tenderness. There is no guarding or rebound.  Musculoskeletal:     Cervical back: Neck supple. No tenderness or bony tenderness.     Thoracic back: No tenderness or bony tenderness.     Lumbar back: No tenderness or bony tenderness.     Right lower leg: No tenderness. No edema.     Left lower leg: No tenderness. No edema.  Skin:    General: Skin is warm and dry.     Findings: No erythema or rash.  Neurological:  Mental Status: She is alert and oriented to person, place, and time.  Psychiatric:        Behavior: Behavior normal.     ED Results / Procedures / Treatments   Labs (all labs ordered are listed, but only abnormal results are displayed) Labs Reviewed  COMPREHENSIVE METABOLIC PANEL  CBC WITH DIFFERENTIAL/PLATELET  MAGNESIUM  TROPONIN I (HIGH SENSITIVITY)  TROPONIN I (HIGH SENSITIVITY)    EKG None  Radiology DG Chest 2 View  Result Date: 02/11/2023 CLINICAL DATA:  Chest pain EXAM: CHEST - 2 VIEW COMPARISON:  X-ray and CT 08/13/2022 and older FINDINGS: No consolidation, pneumothorax or effusion. Normal cardiopericardial silhouette without edema. Degenerative changes of the spine. Surgical clips are seen in the upper abdomen. IMPRESSION: No acute cardiopulmonary  disease Electronically Signed   By: Jill Side M.D.   On: 02/11/2023 20:12    Procedures Procedures  {Document cardiac monitor, telemetry assessment procedure when appropriate:1}  Medications Ordered in ED Medications  sodium chloride 0.9 % bolus 1,000 mL (has no administration in time range)  ondansetron (ZOFRAN) injection 4 mg (has no administration in time range)    ED Course/ Medical Decision Making/ A&P   {   Click here for ABCD2, HEART and other calculatorsREFRESH Note before signing :1}                          Medical Decision Making Risk Prescription drug management.   ***  {Document critical care time when appropriate:1} {Document review of labs and clinical decision tools ie heart score, Chads2Vasc2 etc:1}  {Document your independent review of radiology images, and any outside records:1} {Document your discussion with family members, caretakers, and with consultants:1} {Document social determinants of health affecting pt's care:1} {Document your decision making why or why not admission, treatments were needed:1} Final Clinical Impression(s) / ED Diagnoses Final diagnoses:  None    Rx / DC Orders ED Discharge Orders     None

## 2023-02-12 MED ORDER — ONDANSETRON 4 MG PO TBDP: 4.0000 mg | ORAL_TABLET | Freq: Once | ORAL | Status: DC

## 2023-02-12 MED ORDER — ONDANSETRON 4 MG PO TBDP: 4.0000 mg | ORAL_TABLET | Freq: Three times a day (TID) | ORAL | 0 refills | Status: AC | PRN

## 2023-02-12 NOTE — Discharge Instructions (Addendum)
Recheck with your primary care provider.  Return to ER for worsening or concerning symptoms.  Zofran as needed as prescribed for nausea and vomiting.

## 2023-02-18 ENCOUNTER — Telehealth: Payer: BC Managed Care – PPO | Admitting: Family

## 2023-02-18 DIAGNOSIS — B9689 Other specified bacterial agents as the cause of diseases classified elsewhere: Secondary | ICD-10-CM | POA: Diagnosis not present

## 2023-02-18 DIAGNOSIS — J208 Acute bronchitis due to other specified organisms: Secondary | ICD-10-CM

## 2023-02-18 MED ORDER — AZITHROMYCIN 250 MG PO TABS: ORAL_TABLET | ORAL | 0 refills | Status: AC

## 2023-02-18 MED ORDER — ALBUTEROL SULFATE HFA 108 (90 BASE) MCG/ACT IN AERS: 2.0000 | INHALATION_SPRAY | Freq: Four times a day (QID) | RESPIRATORY_TRACT | 0 refills | Status: DC | PRN

## 2023-02-18 MED ORDER — PROMETHAZINE-DM 6.25-15 MG/5ML PO SYRP: 5.0000 mL | ORAL_SOLUTION | Freq: Three times a day (TID) | ORAL | 0 refills | Status: AC | PRN

## 2023-02-18 MED ORDER — PREDNISONE 20 MG PO TABS: 40.0000 mg | ORAL_TABLET | Freq: Every day | ORAL | 0 refills | Status: AC

## 2023-02-18 NOTE — Progress Notes (Signed)
Virtual Visit Consent   [name], you are scheduled for a virtual visit with a [facility] provider today. Just as with appointments in the office, your consent must be obtained to participate. Your consent will be active for this visit and any virtual visit you may have with one of our providers in the next 365 days. If you have a MyChart account, a copy of this consent can be sent to you electronically.  As this is a virtual visit, video technology does not allow for your provider to perform a traditional examination. This may limit your provider's ability to fully assess your condition. If your provider identifies any concerns that need to be evaluated in person or the need to arrange testing (such as labs, EKG, etc.), we will make arrangements to do so. Although advances in technology are sophisticated, we cannot ensure that it will always work on either your end or our end. If the connection with a video visit is poor, the visit may have to be switched to a telephone visit. With either a video or telephone visit, we are not always able to ensure that we have a secure connection.  By engaging in this virtual visit, you consent to the provision of healthcare and authorize for your insurance to be billed (if applicable) for the services provided during this visit. Depending on your insurance coverage, you may receive a charge related to this service.  I need to obtain your verbal consent now. Are you willing to proceed with your visit today? [name] has provided verbal consent on 02/18/2023 for a virtual visit (video or telephone). Meghan Dun, FNP  Date: 02/18/2023 5:10 PM  Virtual Visit via Video Note   I, [name], connected with  [name]  ([id number], 49-Oct-1977) on 02/18/23 at  5:00 PM EST by a video-enabled telemedicine application and verified that I am speaking with the correct person using two identifiers.  Location: Patient: Virtual Visit Location Patient:  Home Provider: Virtual Visit Location Provider: Home Office   I discussed the limitations of evaluation and management by telemedicine and the availability of in person appointments. The patient expressed understanding and agreed to proceed.    History of Present Illness: [name] is a 47 y.o. who identifies as a female who was assigned female at birth, and is being seen today for cough that over a week ago.  HPI: Cough This is a new problem. The current episode started 1 to 4 weeks ago. The problem has been gradually worsening. The problem occurs every few minutes. The cough is Productive of purulent sputum. Associated symptoms include headaches, myalgias, nasal congestion, shortness of breath and wheezing. Pertinent negatives include no chills, ear congestion, ear pain or fever. She has tried OTC cough suppressant and rest for the symptoms. The treatment provided mild relief. Her past medical history is significant for asthma.    Problems:  Patient Active Problem List   Diagnosis Date Noted   Systemic viral illness 11/13/2012   Morbid obesity with BMI of 40.0-44.9, adult ([place]) 11/13/2012   Tachycardia 11/13/2012   Rheumatoid arthritis ([place])    Asthma     Allergies:  Allergies  Allergen Reactions   Bee Venom Anaphylaxis   Doxycycline Nausea And Vomiting   Diclofenac Swelling   Metoclopramide Anxiety and Rash    "Made me feel like I was going crazy"   Tramadol Anxiety, Rash and Other (See Comments)    Reaction: "felt like I was about to  lose my mind"     Medications:  Current Outpatient Medications:    albuterol (VENTOLIN HFA) 108 (90 Base) MCG/ACT inhaler, Inhale 2 puffs into the lungs every 6 (six) hours as needed for wheezing or shortness of breath., Disp: 8 g, Rfl: 0   azithromycin (ZITHROMAX) 250 MG tablet, Take 500 mg once, then 250 mg for four days, Disp: 6 tablet, Rfl: 0   predniSONE (DELTASONE) 20 MG tablet, Take 2 tablets (40 mg total) by mouth daily with  breakfast., Disp: 10 tablet, Rfl: 0   promethazine-dextromethorphan (PROMETHAZINE-DM) 6.25-15 MG/5ML syrup, Take 5 mLs by mouth 3 (three) times daily as needed for cough., Disp: 118 mL, Rfl: 0   HYDROcodone-acetaminophen (NORCO/VICODIN) 5-325 MG tablet, Take 1 tablet by mouth 3 (three) times daily as needed for moderate pain., Disp: , Rfl:    lidocaine (LIDODERM) 5 %, Place 1 patch onto the skin daily as needed. Apply patch to area most significant pain once per day.  Remove and discard patch within 12 hours of application., Disp: 15 patch, Rfl: 0   mupirocin ointment (BACTROBAN) 2 %, Apply 1 Application topically 2 (two) times daily., Disp: 22 g, Rfl: 0   naproxen (NAPROSYN) 500 MG tablet, Take 1 tablet (500 mg total) by mouth 2 (two) times daily as needed for moderate pain., Disp: 15 tablet, Rfl: 0   ondansetron (ZOFRAN-ODT) 4 MG disintegrating tablet, Take 1 tablet (4 mg total) by mouth every 8 (eight) hours as needed for nausea or vomiting., Disp: 12 tablet, Rfl: 0   tiZANidine (ZANAFLEX) 4 MG tablet, Take 4 mg by mouth 3 (three) times daily as needed for muscle spasms., Disp: , Rfl:   Observations/Objective: Patient is ell-developed, well-nourished in no acute distress.  Resting comfortably  at home.  Head is normocephalic, atraumatic.  No labored breathing.  Speech is clear and coherent with logical content.  Patient is alert and oriented at baseline.  Nasal congestion   Assessment and Plan: 1. Acute bacterial bronchitis - azithromycin (ZITHROMAX) 250 MG tablet; Take 500 mg once, then 250 mg for four days  Dispense: 6 tablet; Refill: 0 - predniSONE (DELTASONE) 20 MG tablet; Take 2 tablets (40 mg total) by mouth daily with breakfast.  Dispense: 10 tablet; Refill: 0 - promethazine-dextromethorphan (PROMETHAZINE-DM) 6.25-15 MG/5ML syrup; Take 5 mLs by mouth 3 (three) times daily as needed for cough.  Dispense: 118 mL; Refill: 0  - Take meds as prescribed - Use a cool mist humidifier   -Use saline nose sprays frequently -Force fluids -For any cough or congestion  Use plain Mucinex- regular strength or max strength is fine -For fever or aces or pains- take tylenol or ibuprofen. -Throat lozenges if help -New toothbrush in 3 days Follow up if symptoms worsen  or do not improve   Follow Up Instructions: I discussed the assessment and treatment plan with the patient. The patient was provided an opportunity to ask questions and all were answered. The patient agreed with the plan and demonstrated an understanding of the instructions.  A copy of instructions were sent to the patient via MyChart unless otherwise noted below.     The patient was advised to call back or seek an in-person evaluation if the symptoms worsen or if the condition fails to improve as anticipated.  Time:  I spent 12 minutes with the patient via telehealth technology discussing the above problems/concerns.    Meghan Dun, FNP

## 2023-02-19 ENCOUNTER — Other Ambulatory Visit: Payer: Self-pay | Admitting: Family

## 2023-02-19 MED ORDER — ALBUTEROL SULFATE HFA 108 (90 BASE) MCG/ACT IN AERS: 2.0000 | INHALATION_SPRAY | Freq: Four times a day (QID) | RESPIRATORY_TRACT | 0 refills | Status: AC | PRN

## 2023-02-19 NOTE — Progress Notes (Signed)
Prescription sent to pharmacy.

## 2025-01-11 ENCOUNTER — Ambulatory Visit: Admitting: Neurology

## 2025-05-09 ENCOUNTER — Ambulatory Visit: Admitting: Neurology
